# Patient Record
Sex: Female | Born: 1935 | Race: White | Hispanic: No | Smoking: Former smoker
Health system: Southern US, Community
[De-identification: ages and names within clinical notes are randomized; demographics above are authoritative.]

## PROBLEM LIST (undated history)

## (undated) DIAGNOSIS — E079 Disorder of thyroid, unspecified: Secondary | ICD-10-CM

## (undated) DIAGNOSIS — K219 Gastro-esophageal reflux disease without esophagitis: Secondary | ICD-10-CM

## (undated) DIAGNOSIS — E119 Type 2 diabetes mellitus without complications: Secondary | ICD-10-CM

## (undated) DIAGNOSIS — E785 Hyperlipidemia, unspecified: Secondary | ICD-10-CM

## (undated) DIAGNOSIS — C801 Malignant (primary) neoplasm, unspecified: Secondary | ICD-10-CM

## (undated) DIAGNOSIS — I10 Essential (primary) hypertension: Secondary | ICD-10-CM

## (undated) DIAGNOSIS — M1990 Unspecified osteoarthritis, unspecified site: Secondary | ICD-10-CM

## (undated) DIAGNOSIS — N200 Calculus of kidney: Secondary | ICD-10-CM

## (undated) HISTORY — PX: MELANOMA EXCISION: SHX5266

## (undated) HISTORY — PX: TONSILLECTOMY: SUR1361

## (undated) HISTORY — PX: BREAST SURGERY: SHX581

## (undated) HISTORY — DX: Malignant (primary) neoplasm, unspecified: C80.1

## (undated) HISTORY — DX: Hyperlipidemia, unspecified: E78.5

## (undated) HISTORY — DX: Unspecified osteoarthritis, unspecified site: M19.90

## (undated) HISTORY — DX: Gastro-esophageal reflux disease without esophagitis: K21.9

## (undated) HISTORY — DX: Disorder of thyroid, unspecified: E07.9

## (undated) HISTORY — PX: BACK SURGERY: SHX140

---

## 1998-02-10 ENCOUNTER — Ambulatory Visit (HOSPITAL_COMMUNITY): Admission: RE | Admit: 1998-02-10 | Discharge: 1998-02-10 | Payer: Self-pay | Admitting: Internal Medicine

## 1998-08-20 ENCOUNTER — Ambulatory Visit (HOSPITAL_COMMUNITY): Admission: RE | Admit: 1998-08-20 | Discharge: 1998-08-20 | Payer: Self-pay | Admitting: *Deleted

## 1999-08-12 ENCOUNTER — Ambulatory Visit (HOSPITAL_COMMUNITY): Admission: RE | Admit: 1999-08-12 | Discharge: 1999-08-12 | Payer: Self-pay | Admitting: *Deleted

## 2000-08-03 ENCOUNTER — Ambulatory Visit (HOSPITAL_COMMUNITY): Admission: RE | Admit: 2000-08-03 | Discharge: 2000-08-03 | Payer: Self-pay | Admitting: Obstetrics & Gynecology

## 2001-07-26 ENCOUNTER — Ambulatory Visit (HOSPITAL_COMMUNITY): Admission: RE | Admit: 2001-07-26 | Discharge: 2001-07-26 | Payer: Self-pay

## 2002-07-02 ENCOUNTER — Ambulatory Visit (HOSPITAL_COMMUNITY): Admission: RE | Admit: 2002-07-02 | Discharge: 2002-07-02 | Payer: Self-pay | Admitting: *Deleted

## 2003-03-19 ENCOUNTER — Encounter: Admission: RE | Admit: 2003-03-19 | Discharge: 2003-03-19 | Payer: Self-pay | Admitting: Internal Medicine

## 2003-03-19 ENCOUNTER — Encounter: Payer: Self-pay | Admitting: Internal Medicine

## 2005-02-23 ENCOUNTER — Ambulatory Visit: Payer: Self-pay | Admitting: Hematology & Oncology

## 2006-06-28 ENCOUNTER — Encounter: Admission: RE | Admit: 2006-06-28 | Discharge: 2006-06-28 | Payer: Self-pay | Admitting: Radiology

## 2006-07-13 ENCOUNTER — Encounter: Admission: RE | Admit: 2006-07-13 | Discharge: 2006-07-13 | Payer: Self-pay | Admitting: General Surgery

## 2006-07-18 ENCOUNTER — Ambulatory Visit (HOSPITAL_BASED_OUTPATIENT_CLINIC_OR_DEPARTMENT_OTHER): Admission: RE | Admit: 2006-07-18 | Discharge: 2006-07-18 | Payer: Self-pay | Admitting: General Surgery

## 2006-07-18 ENCOUNTER — Encounter (INDEPENDENT_AMBULATORY_CARE_PROVIDER_SITE_OTHER): Payer: Self-pay | Admitting: Specialist

## 2006-08-17 ENCOUNTER — Ambulatory Visit: Payer: Self-pay | Admitting: Hematology & Oncology

## 2006-08-30 LAB — CBC WITH DIFFERENTIAL/PLATELET
BASO%: 1 % (ref 0.0–2.0)
Basophils Absolute: 0.1 10*3/uL (ref 0.0–0.1)
EOS%: 1.5 % (ref 0.0–7.0)
HGB: 15.7 g/dL (ref 11.6–15.9)
MCH: 31 pg (ref 26.0–34.0)
MCHC: 35.3 g/dL (ref 32.0–36.0)
MCV: 87.9 fL (ref 81.0–101.0)
MONO%: 6.5 % (ref 0.0–13.0)
RBC: 5.07 10*6/uL (ref 3.70–5.32)
RDW: 13.4 % (ref 11.3–14.5)

## 2006-08-30 LAB — COMPREHENSIVE METABOLIC PANEL
AST: 24 U/L (ref 0–37)
Albumin: 4.6 g/dL (ref 3.5–5.2)
Alkaline Phosphatase: 74 U/L (ref 39–117)
BUN: 11 mg/dL (ref 6–23)
Creatinine, Ser: 0.78 mg/dL (ref 0.40–1.20)
Potassium: 3.7 mEq/L (ref 3.5–5.3)

## 2006-09-10 ENCOUNTER — Ambulatory Visit: Admission: RE | Admit: 2006-09-10 | Discharge: 2006-11-22 | Payer: Self-pay | Admitting: Radiation Oncology

## 2006-09-11 LAB — HM DEXA SCAN

## 2006-10-18 ENCOUNTER — Ambulatory Visit: Payer: Self-pay | Admitting: Hematology & Oncology

## 2007-01-22 ENCOUNTER — Ambulatory Visit: Payer: Self-pay | Admitting: Hematology & Oncology

## 2007-01-24 LAB — CBC WITH DIFFERENTIAL/PLATELET
Basophils Absolute: 0 10*3/uL (ref 0.0–0.1)
EOS%: 1.1 % (ref 0.0–7.0)
HCT: 41.8 % (ref 34.8–46.6)
HGB: 15.1 g/dL (ref 11.6–15.9)
MCH: 31.3 pg (ref 26.0–34.0)
MCV: 86.8 fL (ref 81.0–101.0)
MONO%: 6.1 % (ref 0.0–13.0)
NEUT%: 66.4 % (ref 39.6–76.8)

## 2007-01-24 LAB — COMPREHENSIVE METABOLIC PANEL
ALT: 32 U/L (ref 0–35)
CO2: 28 mEq/L (ref 19–32)
Calcium: 9.6 mg/dL (ref 8.4–10.5)
Chloride: 107 mEq/L (ref 96–112)
Creatinine, Ser: 0.76 mg/dL (ref 0.40–1.20)
Glucose, Bld: 113 mg/dL — ABNORMAL HIGH (ref 70–99)
Sodium: 143 mEq/L (ref 135–145)
Total Protein: 7.3 g/dL (ref 6.0–8.3)

## 2007-04-24 ENCOUNTER — Ambulatory Visit: Payer: Self-pay | Admitting: Hematology & Oncology

## 2007-04-29 LAB — CBC WITH DIFFERENTIAL/PLATELET
BASO%: 1.7 % (ref 0.0–2.0)
MCHC: 36.5 g/dL — ABNORMAL HIGH (ref 32.0–36.0)
MONO#: 0.3 10*3/uL (ref 0.1–0.9)
NEUT#: 3.1 10*3/uL (ref 1.5–6.5)
RBC: 4.84 10*6/uL (ref 3.70–5.32)
WBC: 5.2 10*3/uL (ref 3.9–10.0)
lymph#: 1.6 10*3/uL (ref 0.9–3.3)

## 2007-04-29 LAB — COMPREHENSIVE METABOLIC PANEL
ALT: 34 U/L (ref 0–35)
Albumin: 4.2 g/dL (ref 3.5–5.2)
CO2: 27 mEq/L (ref 19–32)
Calcium: 9.4 mg/dL (ref 8.4–10.5)
Chloride: 107 mEq/L (ref 96–112)
Potassium: 3.7 mEq/L (ref 3.5–5.3)
Sodium: 144 mEq/L (ref 135–145)
Total Protein: 7.1 g/dL (ref 6.0–8.3)

## 2007-08-05 ENCOUNTER — Ambulatory Visit: Payer: Self-pay | Admitting: Hematology & Oncology

## 2007-08-07 LAB — COMPREHENSIVE METABOLIC PANEL
ALT: 34 U/L (ref 0–35)
Alkaline Phosphatase: 76 U/L (ref 39–117)
CO2: 22 mEq/L (ref 19–32)
Sodium: 140 mEq/L (ref 135–145)
Total Bilirubin: 0.6 mg/dL (ref 0.3–1.2)
Total Protein: 7.1 g/dL (ref 6.0–8.3)

## 2007-08-07 LAB — CBC WITH DIFFERENTIAL/PLATELET
BASO%: 0.8 % (ref 0.0–2.0)
LYMPH%: 33.5 % (ref 14.0–48.0)
MCHC: 36.4 g/dL — ABNORMAL HIGH (ref 32.0–36.0)
MONO#: 0.3 10*3/uL (ref 0.1–0.9)
Platelets: 245 10*3/uL (ref 145–400)
RBC: 4.78 10*6/uL (ref 3.70–5.32)
WBC: 5.4 10*3/uL (ref 3.9–10.0)
lymph#: 1.8 10*3/uL (ref 0.9–3.3)

## 2007-12-09 ENCOUNTER — Ambulatory Visit: Payer: Self-pay | Admitting: Hematology & Oncology

## 2007-12-11 LAB — COMPREHENSIVE METABOLIC PANEL
ALT: 39 U/L — ABNORMAL HIGH (ref 0–35)
AST: 28 U/L (ref 0–37)
Albumin: 4.3 g/dL (ref 3.5–5.2)
Alkaline Phosphatase: 86 U/L (ref 39–117)
Chloride: 106 mEq/L (ref 96–112)
Potassium: 3.8 mEq/L (ref 3.5–5.3)
Sodium: 142 mEq/L (ref 135–145)
Total Protein: 7.1 g/dL (ref 6.0–8.3)

## 2007-12-11 LAB — CBC WITH DIFFERENTIAL/PLATELET
BASO%: 1.3 % (ref 0.0–2.0)
EOS%: 3.1 % (ref 0.0–7.0)
MCH: 29.1 pg (ref 26.0–34.0)
MCV: 86.2 fL (ref 81.0–101.0)
MONO%: 5.9 % (ref 0.0–13.0)
RBC: 4.93 10*6/uL (ref 3.70–5.32)
RDW: 13 % (ref 11.3–14.5)
lymph#: 1.7 10*3/uL (ref 0.9–3.3)

## 2008-04-07 ENCOUNTER — Ambulatory Visit: Payer: Self-pay | Admitting: Hematology & Oncology

## 2008-04-09 LAB — COMPREHENSIVE METABOLIC PANEL
AST: 27 U/L (ref 0–37)
Albumin: 4.3 g/dL (ref 3.5–5.2)
BUN: 10 mg/dL (ref 6–23)
CO2: 23 mEq/L (ref 19–32)
Calcium: 9 mg/dL (ref 8.4–10.5)
Chloride: 107 mEq/L (ref 96–112)
Creatinine, Ser: 0.72 mg/dL (ref 0.40–1.20)
Glucose, Bld: 127 mg/dL — ABNORMAL HIGH (ref 70–99)
Potassium: 3.6 mEq/L (ref 3.5–5.3)

## 2008-04-09 LAB — CBC WITH DIFFERENTIAL/PLATELET
Basophils Absolute: 0.1 10*3/uL (ref 0.0–0.1)
Eosinophils Absolute: 0.1 10*3/uL (ref 0.0–0.5)
HCT: 41.1 % (ref 34.8–46.6)
HGB: 14.7 g/dL (ref 11.6–15.9)
MCH: 30.2 pg (ref 26.0–34.0)
MONO#: 0.3 10*3/uL (ref 0.1–0.9)
NEUT#: 3 10*3/uL (ref 1.5–6.5)
NEUT%: 57.5 % (ref 39.6–76.8)
RDW: 12.1 % (ref 11.3–14.5)
lymph#: 1.7 10*3/uL (ref 0.9–3.3)

## 2008-06-30 LAB — HM COLONOSCOPY

## 2008-09-30 ENCOUNTER — Ambulatory Visit: Payer: Self-pay | Admitting: Hematology & Oncology

## 2008-10-01 LAB — CBC WITH DIFFERENTIAL (CANCER CENTER ONLY)
Eosinophils Absolute: 0.2 10*3/uL (ref 0.0–0.5)
HCT: 41.8 % (ref 34.8–46.6)
LYMPH%: 38.4 % (ref 14.0–48.0)
MCH: 31 pg (ref 26.0–34.0)
MCV: 88 fL (ref 81–101)
MONO#: 0.4 10*3/uL (ref 0.1–0.9)
MONO%: 6 % (ref 0.0–13.0)
NEUT%: 52 % (ref 39.6–80.0)
RBC: 4.75 10*6/uL (ref 3.70–5.32)
RDW: 12 % (ref 10.5–14.6)
WBC: 6.7 10*3/uL (ref 3.9–10.0)

## 2008-10-01 LAB — COMPREHENSIVE METABOLIC PANEL
AST: 18 U/L (ref 0–37)
Albumin: 4.2 g/dL (ref 3.5–5.2)
BUN: 13 mg/dL (ref 6–23)
Calcium: 9.5 mg/dL (ref 8.4–10.5)
Chloride: 103 mEq/L (ref 96–112)
Creatinine, Ser: 0.88 mg/dL (ref 0.40–1.20)
Glucose, Bld: 85 mg/dL (ref 70–99)
Potassium: 3.7 mEq/L (ref 3.5–5.3)

## 2009-03-31 ENCOUNTER — Ambulatory Visit: Payer: Self-pay | Admitting: Hematology & Oncology

## 2009-04-01 LAB — CBC WITH DIFFERENTIAL (CANCER CENTER ONLY)
BASO#: 0.1 10*3/uL (ref 0.0–0.2)
Eosinophils Absolute: 0.2 10*3/uL (ref 0.0–0.5)
HGB: 15.6 g/dL (ref 11.6–15.9)
LYMPH%: 35.8 % (ref 14.0–48.0)
MCH: 30.5 pg (ref 26.0–34.0)
MCV: 90 fL (ref 81–101)
MONO#: 0.4 10*3/uL (ref 0.1–0.9)
Platelets: 262 10*3/uL (ref 145–400)
RBC: 5.12 10*6/uL (ref 3.70–5.32)
WBC: 7.6 10*3/uL (ref 3.9–10.0)

## 2009-04-02 LAB — COMPREHENSIVE METABOLIC PANEL
ALT: 25 U/L (ref 0–35)
AST: 20 U/L (ref 0–37)
Albumin: 4.3 g/dL (ref 3.5–5.2)
Alkaline Phosphatase: 73 U/L (ref 39–117)
Calcium: 9.7 mg/dL (ref 8.4–10.5)
Chloride: 105 mEq/L (ref 96–112)
Potassium: 3.5 mEq/L (ref 3.5–5.3)

## 2009-10-13 ENCOUNTER — Ambulatory Visit: Payer: Self-pay | Admitting: Hematology & Oncology

## 2009-10-28 LAB — CBC WITH DIFFERENTIAL (CANCER CENTER ONLY)
BASO%: 0.6 % (ref 0.0–2.0)
EOS%: 1.7 % (ref 0.0–7.0)
LYMPH%: 33.8 % (ref 14.0–48.0)
MCH: 30.1 pg (ref 26.0–34.0)
MCHC: 34.5 g/dL (ref 32.0–36.0)
MCV: 87 fL (ref 81–101)
MONO%: 5.3 % (ref 0.0–13.0)
Platelets: 239 10*3/uL (ref 145–400)
RDW: 11.5 % (ref 10.5–14.6)
WBC: 6.8 10*3/uL (ref 3.9–10.0)

## 2009-10-28 LAB — VITAMIN D 25 HYDROXY (VIT D DEFICIENCY, FRACTURES): Vit D, 25-Hydroxy: 39 ng/mL (ref 30–89)

## 2009-10-28 LAB — COMPREHENSIVE METABOLIC PANEL
ALT: 30 U/L (ref 0–35)
AST: 23 U/L (ref 0–37)
Alkaline Phosphatase: 68 U/L (ref 39–117)
BUN: 10 mg/dL (ref 6–23)
Creatinine, Ser: 0.83 mg/dL (ref 0.40–1.20)
Potassium: 4.2 mEq/L (ref 3.5–5.3)

## 2010-04-13 ENCOUNTER — Ambulatory Visit: Payer: Self-pay | Admitting: Hematology & Oncology

## 2010-04-14 LAB — COMPREHENSIVE METABOLIC PANEL
BUN: 8 mg/dL (ref 6–23)
CO2: 22 mEq/L (ref 19–32)
Calcium: 9.2 mg/dL (ref 8.4–10.5)
Chloride: 104 mEq/L (ref 96–112)
Creatinine, Ser: 0.82 mg/dL (ref 0.40–1.20)
Glucose, Bld: 175 mg/dL — ABNORMAL HIGH (ref 70–99)
Total Bilirubin: 0.6 mg/dL (ref 0.3–1.2)

## 2010-04-14 LAB — CBC WITH DIFFERENTIAL (CANCER CENTER ONLY)
BASO%: 0.6 % (ref 0.0–2.0)
Eosinophils Absolute: 0.3 10*3/uL (ref 0.0–0.5)
LYMPH#: 2.3 10*3/uL (ref 0.9–3.3)
MCV: 89 fL (ref 81–101)
MONO#: 0.2 10*3/uL (ref 0.1–0.9)
NEUT#: 2.6 10*3/uL (ref 1.5–6.5)
Platelets: 235 10*3/uL (ref 145–400)
RBC: 5.07 10*6/uL (ref 3.70–5.32)
RDW: 11.4 % (ref 10.5–14.6)
WBC: 5.4 10*3/uL (ref 3.9–10.0)

## 2010-04-14 LAB — VITAMIN D 25 HYDROXY (VIT D DEFICIENCY, FRACTURES): Vit D, 25-Hydroxy: 41 ng/mL (ref 30–89)

## 2010-05-24 ENCOUNTER — Encounter: Admission: RE | Admit: 2010-05-24 | Discharge: 2010-05-24 | Payer: Self-pay | Admitting: Internal Medicine

## 2010-10-11 ENCOUNTER — Ambulatory Visit: Payer: Self-pay | Admitting: Hematology & Oncology

## 2010-10-13 LAB — CBC WITH DIFFERENTIAL (CANCER CENTER ONLY)
BASO#: 0 10*3/uL (ref 0.0–0.2)
EOS%: 1.3 % (ref 0.0–7.0)
HCT: 45.1 % (ref 34.8–46.6)
HGB: 15.4 g/dL (ref 11.6–15.9)
LYMPH#: 2.8 10*3/uL (ref 0.9–3.3)
MCHC: 34.2 g/dL (ref 32.0–36.0)
MONO#: 0.4 10*3/uL (ref 0.1–0.9)
NEUT#: 2 10*3/uL (ref 1.5–6.5)
NEUT%: 38 % — ABNORMAL LOW (ref 39.6–80.0)
RBC: 5.08 10*6/uL (ref 3.70–5.32)
WBC: 5.4 10*3/uL (ref 3.9–10.0)

## 2010-10-14 LAB — COMPREHENSIVE METABOLIC PANEL
ALT: 25 U/L (ref 0–35)
AST: 25 U/L (ref 0–37)
Albumin: 4.4 g/dL (ref 3.5–5.2)
BUN: 11 mg/dL (ref 6–23)
CO2: 27 mEq/L (ref 19–32)
Calcium: 9.9 mg/dL (ref 8.4–10.5)
Chloride: 102 mEq/L (ref 96–112)
Creatinine, Ser: 0.87 mg/dL (ref 0.40–1.20)
Potassium: 4.4 mEq/L (ref 3.5–5.3)

## 2010-10-14 LAB — VITAMIN D 25 HYDROXY (VIT D DEFICIENCY, FRACTURES): Vit D, 25-Hydroxy: 46 ng/mL (ref 30–89)

## 2010-11-20 ENCOUNTER — Encounter: Payer: Self-pay | Admitting: Radiology

## 2011-03-17 NOTE — Op Note (Signed)
NAMEMARLON, [name]              ACCOUNT NO.:  [id number]   MEDICAL RECORD NO.:  [id number]          PATIENT TYPE:  AMB   LOCATION:  DSC                          FACILITY:  [facility]   PHYSICIAN:  Sharlet Salina T. Hoxworth, M.D.DATE OF BIRTH:  June 25, 1935   DATE OF PROCEDURE:  07/18/2006  DATE OF DISCHARGE:                                 OPERATIVE REPORT   PREOPERATIVE DIAGNOSES:  1. Cancer left breast.  2. Intraductal papilloma right breast.   POSTOPERATIVE DIAGNOSES:  1. Cancer left breast.  2. Intraductal papilloma right breast.   SURGICAL PROCEDURES:  1. Blue dye injection left breast.  2. Left axillary sentinel lymph node biopsy.  3. Needle-localized left breast lumpectomy.  4. Needle-localized right breast lumpectomy.   SURGEON:  Lorne Skeens. Hoxworth, M.D.   ANESTHESIA:  Laryngeal mask general.   BRIEF HISTORY:  [name] is a 76 year old female who, on a recent  screening mammogram, was found to have a new small mass at the 8 o'clock  position of the left breast.  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  MRI showed, in addition, showed a small mass on the right side  not seen on mammogram and core biopsy has shown an intraductal papilloma.  After extensive discussion regarding treatment options and risks detailed  elsewhere, we have elected to proceed with left breast lumpectomy with  axillary sentinel lymph node biopsy and possible axillary dissection and  right breast lumpectomy.  Following needle localization of both these areas  and following injection of 1 mCi of technetium sulfur colloid in nuclear  medicine prior to the procedure, she is brought to the operating room for  these procedures.   DESCRIPTION OF PROCEDURE:  Patient brought to the operating room and placed  in the supine position on the operating table and laryngeal mask general  anesthesia was induced.  Both  breasts were widely sterilely prepped and  draped.  The right breast was then isolated and attention was turned to the  left axilla.  A hot area in the left axilla was easily localized with the  NeoProbe and a small incision made and dissection carried down through  subcutaneous tissue and clavipectoral fascia using cautery.  Using the  NeoProbe for guide, dissection was bluntly carried down onto a bright blue  lymph node which had very hot counts.  This node was elevated and completely  excised with the cautery.  Ex vivo of the node had counts of approximately  1700 and background in the axilla was less than 20.  This was sent as hot  blue axillary sentinel lymph node.  While awaiting this report, attention  was turned to the lumpectomy.  A curvilinear incision was made in the medial  inferior left breast just above the wire insertion site.  Dissection carried  down to the subcutaneous tissue.  The wire was brought into the wound.  A  generous core of tissue was then excised with the cautery around the shaft  of the tip of the wire.  There was a  slight firmness palpable within the  specimen but no large mass.  The sentinel lymph node touch prep returned as  negative for malignant cells.  The breast specimen was oriented and sent for  specimen x-ray which showed the lesion within the specimen.  Each wound was  then infiltrated with Marcaine and closed with interrupted subcutaneous 4-0  Monocryl running subcuticular 4-0 Monocryl and Steri-Strips after obtaining  complete hemostasis with cautery.  The right breast was then exposed.  Gloves and instruments changed.  A curvilinear incision was made near the  wire insertion site in the inferior right breast essentially circumareolar  and dissection carried down to the subcutaneous tissue.  The wire was then  brought into the wound and a specimen of tissue excised around the shaft of  the wire with cautery.  Specimen mammography confirmed  inclusion of the clip  and the lesion within this specimen.  This area was infiltrated with  Marcaine and complete hemostasis assured.  It was closed identically to the  other incisions.  All sponge, needle and instrument counts were correct.  Dry sterile dressings were applied.  The patient was taken to recovery in  good condition.      Lorne Skeens. Hoxworth, M.D.  Electronically Signed     BTH/MEDQ  D:  07/18/2006  T:  07/19/2006  Job:  [id number]

## 2011-10-11 ENCOUNTER — Other Ambulatory Visit: Payer: Self-pay | Admitting: *Deleted

## 2011-10-11 DIAGNOSIS — C50919 Malignant neoplasm of unspecified site of unspecified female breast: Secondary | ICD-10-CM

## 2011-10-11 MED ORDER — LETROZOLE 2.5 MG PO TABS: 2.5000 mg | ORAL_TABLET | Freq: Every day | ORAL | Status: DC

## 2011-10-16 ENCOUNTER — Other Ambulatory Visit: Payer: Self-pay | Admitting: *Deleted

## 2011-10-16 ENCOUNTER — Telehealth: Payer: Self-pay | Admitting: *Deleted

## 2011-10-16 DIAGNOSIS — C50919 Malignant neoplasm of unspecified site of unspecified female breast: Secondary | ICD-10-CM

## 2011-10-16 DIAGNOSIS — C50319 Malignant neoplasm of lower-inner quadrant of unspecified female breast: Secondary | ICD-10-CM

## 2011-10-16 MED ORDER — LETROZOLE 2.5 MG PO TABS: 2.5000 mg | ORAL_TABLET | Freq: Every day | ORAL | Status: DC

## 2011-10-16 NOTE — Telephone Encounter (Signed)
Pt called stating that she needs a Femara refill (has one pill left). Rx was not received at [facility]. Also needs an appt with Dr Myna Hidalgo. She stated that she normally sees him in Dec. Will send request to schedulers to schedule appt and re-send rx request to [facility] for 30 day fill as his last note stated she should finish in Dec 2012.

## 2011-10-16 NOTE — Telephone Encounter (Signed)
Left pt message with 12-19 appointment

## 2011-10-18 ENCOUNTER — Ambulatory Visit (HOSPITAL_BASED_OUTPATIENT_CLINIC_OR_DEPARTMENT_OTHER): Payer: Medicare Other | Admitting: Hematology & Oncology

## 2011-10-18 ENCOUNTER — Other Ambulatory Visit (HOSPITAL_BASED_OUTPATIENT_CLINIC_OR_DEPARTMENT_OTHER): Payer: Medicare Other | Admitting: Lab

## 2011-10-18 DIAGNOSIS — C50319 Malignant neoplasm of lower-inner quadrant of unspecified female breast: Secondary | ICD-10-CM

## 2011-10-18 DIAGNOSIS — C50919 Malignant neoplasm of unspecified site of unspecified female breast: Secondary | ICD-10-CM

## 2011-10-18 LAB — CBC WITH DIFFERENTIAL (CANCER CENTER ONLY)
BASO#: 0 10*3/uL (ref 0.0–0.2)
EOS%: 1 % (ref 0.0–7.0)
HGB: 15.4 g/dL (ref 11.6–15.9)
LYMPH%: 36.7 % (ref 14.0–48.0)
MCH: 30.9 pg (ref 26.0–34.0)
MCHC: 35.7 g/dL (ref 32.0–36.0)
MCV: 86 fL (ref 81–101)
MONO%: 5.8 % (ref 0.0–13.0)
NEUT#: 4 10*3/uL (ref 1.5–6.5)

## 2011-10-18 LAB — COMPREHENSIVE METABOLIC PANEL
AST: 24 U/L (ref 0–37)
Albumin: 4.3 g/dL (ref 3.5–5.2)
Alkaline Phosphatase: 68 U/L (ref 39–117)
BUN: 10 mg/dL (ref 6–23)
Creatinine, Ser: 0.73 mg/dL (ref 0.50–1.10)
Potassium: 4 mEq/L (ref 3.5–5.3)
Total Bilirubin: 0.5 mg/dL (ref 0.3–1.2)

## 2011-10-18 MED ORDER — LETROZOLE 2.5 MG PO TABS: 2.5000 mg | ORAL_TABLET | Freq: Every day | ORAL | Status: DC

## 2011-10-18 NOTE — Progress Notes (Signed)
Addended by: Arlan Organ R on: 10/18/2011 02:14 PM   Modules accepted: Orders

## 2011-10-18 NOTE — Progress Notes (Signed)
This office note has been dictated.

## 2011-10-18 NOTE — Progress Notes (Signed)
CC:   [name], M.D. Lorne Skeens. Hoxworth, M.D. 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  CURRENT THERAPY:  Femara 2.5 mg p.o. daily.  HISTORY:  [name] comes in for followup.  She now is married.  She got married after Thanksgiving.  I am happy for her.  She feels well.  She has had no complaints.  We last saw her a year ago. She has had a pretty good year.  She has not noted any issues with increasing fatigue or weakness.  There is no cough or shortness of breath.  She has had no nausea or vomiting. There has been no change in bowel or bladder habits.  She has not noted any rashes.  There are no headaches.  There is no change in vision.  PHYSICAL EXAM:  General:  This is a well-developed, well-nourished, white female in no obvious distress.  Vital Signs:  97.7, pulse 75, respiratory rate 18, blood pressure 147/74.  Weight is 169.  Head and Neck Exam:  Normocephalic, atraumatic skull.  There are no ocular or oral lesions.  There are no palpable cervical or supraclavicular lymph nodes.  Lungs:  Clear bilaterally.  Cardiac Exam:  Regular rate and rhythm with normal S1, S2.  There are no murmurs, rubs, or bruits. Breast exam:  Right breast with no masses, edema, or erythema.  There is no right axillary adenopathy.  Left breast shows well-healed lumpectomy at the at 8 o'clock position.  She has no masses in the left breast. There may be some slight contraction from past radiation.  There is no left axillary adenopathy.  Abdominal Exam:  Soft with good bowel sounds. There is no palpable abdominal mass.  There is no palpable hepatosplenomegaly.  Back Exam:  No tenderness over the spine, ribs, or hips.  Extremities:  No clubbing, cyanosis, or edema.  Neurological Exam:  No focal neurological deficits.  LABORATORY STUDIES:  White cell count 7.1, hemoglobin 15.4, hematocrit 43.1, platelet count 243.  Sodium 141, potassium  4.4, BUN 11, creatinine 0.7.  LFTs are normal.  Vitamin D is 46.  IMPRESSION:  [name] is a 76 year old white female with history of stage I ductal carcinoma of the left breast.  She underwent lumpectomy in December 2007.  She did receive postop radiation therapy.  We will finish up 5 years of Femara in June 2013.  I will hopefully be able to get her off the Femara at that point in time.  I am glad that she did find happiness again.  Her husband passed away probably about 11 years ago from esophageal cancer.  I will see [name] back in June to follow up with her.    ______________________________ Josph Macho, M.D. PRE/MEDQ  D:  10/18/2011  T:  10/18/2011  Job:  [id number]

## 2012-02-07 ENCOUNTER — Other Ambulatory Visit: Payer: Self-pay | Admitting: *Deleted

## 2012-02-07 MED ORDER — LETROZOLE 2.5 MG PO TABS: 2.5000 mg | ORAL_TABLET | Freq: Every day | ORAL | Status: DC

## 2012-02-07 NOTE — Telephone Encounter (Signed)
Pt called stating she was in need of a Femara rx. While she was down in [facility] she had it transferred from the pharmacy here but when she returned she was told it was only allowed to be transferred once. She has a scheduled f/u in June. This is a chronic med for her. Will send via eprescribe to her pharmacy.

## 2012-03-19 ENCOUNTER — Encounter (HOSPITAL_COMMUNITY): Payer: Self-pay | Admitting: Dietician

## 2012-03-19 NOTE — Progress Notes (Signed)
[facility] Diabetes Class Completion  Date:Mar 19, 2012  Time: 10:00 AM  Pt attended [facility]'s Diabetes Class on Mar 19, 2012.   Patient was educated on the following topics: carbohydrate metabolism in relation to diabetes, sources of carbohydrate, carbohydrate counting, meal planning strategies, food label reading, and portion control.   Melody Haver, RD, LDN Date:Mar 19, 2012 Time: 10:00 AM

## 2012-04-11 ENCOUNTER — Ambulatory Visit (HOSPITAL_BASED_OUTPATIENT_CLINIC_OR_DEPARTMENT_OTHER): Payer: Medicare Other | Admitting: Hematology & Oncology

## 2012-04-11 ENCOUNTER — Other Ambulatory Visit (HOSPITAL_BASED_OUTPATIENT_CLINIC_OR_DEPARTMENT_OTHER): Payer: Medicare Other | Admitting: Lab

## 2012-04-11 VITALS — BP 117/69 | HR 68 | Temp 97.6°F | Ht 62.0 in | Wt 169.0 lb

## 2012-04-11 DIAGNOSIS — C50319 Malignant neoplasm of lower-inner quadrant of unspecified female breast: Secondary | ICD-10-CM

## 2012-04-11 DIAGNOSIS — C50919 Malignant neoplasm of unspecified site of unspecified female breast: Secondary | ICD-10-CM

## 2012-04-11 DIAGNOSIS — M818 Other osteoporosis without current pathological fracture: Secondary | ICD-10-CM

## 2012-04-11 LAB — CBC WITH DIFFERENTIAL (CANCER CENTER ONLY)
Eosinophils Absolute: 0.1 10*3/uL (ref 0.0–0.5)
MONO#: 0.6 10*3/uL (ref 0.1–0.9)
MONO%: 7.3 % (ref 0.0–13.0)
NEUT#: 3.5 10*3/uL (ref 1.5–6.5)
Platelets: 233 10*3/uL (ref 145–400)
RBC: 5.03 10*6/uL (ref 3.70–5.32)
WBC: 7.8 10*3/uL (ref 3.9–10.0)

## 2012-04-11 NOTE — Progress Notes (Signed)
This office note has been dictated.

## 2012-04-12 LAB — COMPREHENSIVE METABOLIC PANEL
Albumin: 4.4 g/dL (ref 3.5–5.2)
Alkaline Phosphatase: 71 U/L (ref 39–117)
CO2: 29 mEq/L (ref 19–32)
Glucose, Bld: 89 mg/dL (ref 70–99)
Potassium: 4.2 mEq/L (ref 3.5–5.3)
Sodium: 141 mEq/L (ref 135–145)
Total Protein: 7.3 g/dL (ref 6.0–8.3)

## 2012-04-12 NOTE — Progress Notes (Signed)
CC:   Antony Madura, M.D. Lorne Skeens. Johna Sheriff, M.D. Jeralyn Ruths, MD  DIAGNOSIS:  Stage I infiltrating ductal carcinoma of the left breast (T1c N0 M0).  CURRENT THERAPY:  The patient to stop Femara.  INTERIM HISTORY:  Ms. Puccinelli comes in for followup.  She has been on Femara now for 5 years.  As such, we are going to get her off Femara.  I think that she will be okay being on Femara for 5 years.  She is doing well.  She is active around the house.  She will be going down to [place] for a couple of months with her husband to visit family.  She has had no problems with or myalgias or arthralgias.  She has had no fevers, sweats, or chills.  There has been no change in bowel or bladder habits.  She has had no cough or shortness of breath.  PHYSICAL EXAMINATION:  General:  This is a well-developed, well- nourished white female in no obvious distress.  Vital Signs: Temperature 97.6, pulse 68, respiratory rate 18, blood pressure 117/69, weight is 169.  Head and Neck Exam:  Shows a normocephalic, atraumatic skull.  There are no ocular or oral lesions.  There are no palpable cervical or supraclavicular lymph nodes.  Lungs:  Clear bilaterally. Cardiac Exam:  Regular rate and rhythm with a normal S1 and S2.  There are no murmurs, rubs, or bruits.  Abdominal Exam:  Soft with good bowel sounds.  There is no palpable abdominal mass.  There is no palpable hepatosplenomegaly.  Breast Exam:  Shows right breast with no masses, edema, or erythema.  There is no right axillary adenopathy.  Left breast shows a well-healed lumpectomy at about the 8 o'clock position.  There is no tenderness to the left breast.  There may be some slight firmness at the lumpectomy site.  No distinct mass is noted in the left breast. There is no left axillary adenopathy.  Back Exam:  No tenderness over the spine, ribs, or hips.  She has a well-healed lumbar laminectomy scar.  Extremities:  Show no clubbing,  cyanosis, or edema.  She has good range of motion of her joints.  LABORATORY STUDIES:  White cell count 7.8, hemoglobin 15.6, hematocrit 43.9, platelet count 233.  IMPRESSION:  Ms. Badilla is a 77 year old female with stage I infiltrating ductal carcinoma of the left breast.  She underwent lumpectomy and radiation.  She has been on 5 years of Femara.  We will go ahead and stop the Femara now.  We will go ahead and plan to get her back to see [name] in 6 months now.  I do not see a need for any scans to be done.  She will be due, I think for her mammogram in the fall.    ______________________________ Josph Macho, M.D. PRE/MEDQ  D:  04/11/2012  T:  04/12/2012  Job:  [id number]

## 2012-04-16 ENCOUNTER — Telehealth: Payer: Self-pay | Admitting: Hematology & Oncology

## 2012-04-16 NOTE — Telephone Encounter (Addendum)
Message copied by Cathi Roan on Tue Apr 16, 2012  2:49 PM ------      Message from: Josph Macho      Created: Sun Apr 14, 2012  9:11 PM       Call - labs are ok.  Angelique Blonder and spoke to patient today, regarding her lab results per above MD message. ST

## 2012-10-03 ENCOUNTER — Ambulatory Visit: Payer: Medicare Other | Admitting: Hematology & Oncology

## 2012-10-03 ENCOUNTER — Other Ambulatory Visit: Payer: Medicare Other | Admitting: Lab

## 2012-10-10 ENCOUNTER — Other Ambulatory Visit (HOSPITAL_BASED_OUTPATIENT_CLINIC_OR_DEPARTMENT_OTHER): Payer: Medicare Other | Admitting: Lab

## 2012-10-10 ENCOUNTER — Ambulatory Visit (HOSPITAL_BASED_OUTPATIENT_CLINIC_OR_DEPARTMENT_OTHER): Payer: Medicare Other | Admitting: Hematology & Oncology

## 2012-10-10 ENCOUNTER — Encounter: Payer: Self-pay | Admitting: Hematology & Oncology

## 2012-10-10 VITALS — BP 108/50 | HR 51 | Temp 97.5°F | Resp 16 | Ht 62.0 in | Wt 160.0 lb

## 2012-10-10 DIAGNOSIS — M810 Age-related osteoporosis without current pathological fracture: Secondary | ICD-10-CM

## 2012-10-10 DIAGNOSIS — C50919 Malignant neoplasm of unspecified site of unspecified female breast: Secondary | ICD-10-CM

## 2012-10-10 DIAGNOSIS — E559 Vitamin D deficiency, unspecified: Secondary | ICD-10-CM

## 2012-10-10 DIAGNOSIS — M818 Other osteoporosis without current pathological fracture: Secondary | ICD-10-CM

## 2012-10-10 DIAGNOSIS — Z853 Personal history of malignant neoplasm of breast: Secondary | ICD-10-CM

## 2012-10-10 LAB — CBC WITH DIFFERENTIAL (CANCER CENTER ONLY)
BASO#: 0 10*3/uL (ref 0.0–0.2)
EOS%: 1.4 % (ref 0.0–7.0)
Eosinophils Absolute: 0.1 10*3/uL (ref 0.0–0.5)
HGB: 15.6 g/dL (ref 11.6–15.9)
LYMPH#: 3.2 10*3/uL (ref 0.9–3.3)
NEUT#: 3.3 10*3/uL (ref 1.5–6.5)
RBC: 5.12 10*6/uL (ref 3.70–5.32)

## 2012-10-10 NOTE — Progress Notes (Signed)
This office note has been dictated.

## 2012-10-11 LAB — COMPREHENSIVE METABOLIC PANEL
AST: 18 U/L (ref 0–37)
Albumin: 4.3 g/dL (ref 3.5–5.2)
BUN: 16 mg/dL (ref 6–23)
Calcium: 9.9 mg/dL (ref 8.4–10.5)
Chloride: 104 mEq/L (ref 96–112)
Glucose, Bld: 107 mg/dL — ABNORMAL HIGH (ref 70–99)
Potassium: 3.9 mEq/L (ref 3.5–5.3)

## 2012-10-11 NOTE — Progress Notes (Signed)
CC:   Antony Madura, M.D. Jeralyn Ruths, MD Lorne Skeens. Hoxworth, M.D.  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  CURRENT THERAPY:  Observation.  INTERIM HISTORY:  Ms. Wirick comes in for her followup.  We see her every 6 months.  She is doing well.  Since we last saw her, she was I think down in [place].  She and her husband have family down there.  She has had a recent mammogram.  Apparently there was a cyst noted in the left breast.  This apparently was unremarkable.  She has had no change in bowel or bladder habits.  She has had no fevers, sweats or chills.  She has had no cough or shortness breath. There have been no problems with leg swelling.  She has had no rashes.  PHYSICAL EXAMINATION:  General:  This is a well-developed, well- nourished white female in no obvious distress.  Vital signs:  97.5, pulse 51, respiratory rate 16, blood pressure 108/50.  Weight is 160. Head and neck:  Shows a normocephalic, atraumatic skull.  There are no ocular or oral lesions.  There are no palpable cervical or supraclavicular lymph nodes.  Lungs:  Clear bilaterally.  Cardiac: Regular rate and rhythm with a normal S1 and S2.  There are no murmurs, rubs or bruits.  Breasts:  Show right breast with no masses, edema or erythema.  There is no right axillary adenopathy.  Left breast shows well-healed lumpectomy at the 7 o'clock position.  There is some slight contraction of the left breast.  No distinct masses noted in the left breast.  There is no left axillary adenopathy.  Abdomen:  Soft with good bowel sounds.  There is no fluid wave.  There is no palpable hepatosplenomegaly.  Back:  Shows no kyphosis.  There are no osteoporotic changes.  No tenderness is noted over the spine, ribs or hips.  Extremities:  Show no clubbing, cyanosis or edema.  No lymphedema is noted in the left arm.  LABORATORY STUDIES:  White cell count is 7.1, hemoglobin  15.6, hematocrit 45.6, platelet count 228.  IMPRESSION:  Ms. Schiano is a 77 year old white female with history of stage I infiltrating ductal carcinoma of the left breast.  She underwent lumpectomy.  She had radiation therapy.  She completed 5 years of Femara back in June of this past year.  I do not see any evidence of recurrence.  We will continue to follow her along every 6 months for now.    ______________________________ Josph Macho, M.D. PRE/MEDQ  D:  10/10/2012  T:  10/11/2012  Job:  [id number]

## 2012-10-14 ENCOUNTER — Telehealth: Payer: Self-pay | Admitting: *Deleted

## 2012-10-14 NOTE — Telephone Encounter (Signed)
Message copied by Anselm Jungling on Mon Oct 14, 2012  2:02 PM ------      Message from: Josph Macho      Created: Sat Oct 12, 2012 12:18 PM       Call - labs are great!!  Cindee Lame

## 2012-10-14 NOTE — Telephone Encounter (Signed)
Called patient to let her know that her labs were all great per dr. Myna Hidalgo

## 2013-03-05 ENCOUNTER — Ambulatory Visit
Admission: RE | Admit: 2013-03-05 | Discharge: 2013-03-05 | Disposition: A | Discharge status: Home / Self Care | Payer: Medicare Other | Source: Ambulatory Visit | Attending: Internal Medicine | Admitting: Internal Medicine

## 2013-03-05 ENCOUNTER — Other Ambulatory Visit: Payer: Self-pay | Admitting: Internal Medicine

## 2013-03-05 DIAGNOSIS — M549 Dorsalgia, unspecified: Secondary | ICD-10-CM

## 2013-03-21 ENCOUNTER — Telehealth: Payer: Self-pay | Admitting: Hematology & Oncology

## 2013-03-21 NOTE — Telephone Encounter (Signed)
Patient called and cx 04/10/13 apt and resch for 05/01/13

## 2013-04-02 ENCOUNTER — Ambulatory Visit (HOSPITAL_BASED_OUTPATIENT_CLINIC_OR_DEPARTMENT_OTHER): Payer: Medicare Other | Admitting: Hematology & Oncology

## 2013-04-02 ENCOUNTER — Other Ambulatory Visit (HOSPITAL_BASED_OUTPATIENT_CLINIC_OR_DEPARTMENT_OTHER): Payer: Medicare Other | Admitting: Lab

## 2013-04-02 VITALS — BP 104/55 | HR 69 | Temp 98.2°F | Resp 16 | Ht 62.0 in | Wt 162.0 lb

## 2013-04-02 DIAGNOSIS — M810 Age-related osteoporosis without current pathological fracture: Secondary | ICD-10-CM

## 2013-04-02 DIAGNOSIS — C50911 Malignant neoplasm of unspecified site of right female breast: Secondary | ICD-10-CM

## 2013-04-02 DIAGNOSIS — C50919 Malignant neoplasm of unspecified site of unspecified female breast: Secondary | ICD-10-CM

## 2013-04-02 DIAGNOSIS — Z853 Personal history of malignant neoplasm of breast: Secondary | ICD-10-CM

## 2013-04-02 LAB — CBC WITH DIFFERENTIAL (CANCER CENTER ONLY)
BASO%: 0.6 % (ref 0.0–2.0)
HCT: 44.5 % (ref 34.8–46.6)
LYMPH#: 3 10*3/uL (ref 0.9–3.3)
MONO#: 0.5 10*3/uL (ref 0.1–0.9)
NEUT#: 4.8 10*3/uL (ref 1.5–6.5)
Platelets: 263 10*3/uL (ref 145–400)
RDW: 13.6 % (ref 11.1–15.7)
WBC: 8.5 10*3/uL (ref 3.9–10.0)

## 2013-04-02 NOTE — Progress Notes (Signed)
This office note has been dictated.

## 2013-04-03 LAB — COMPREHENSIVE METABOLIC PANEL
ALT: 16 U/L (ref 0–35)
AST: 17 U/L (ref 0–37)
CO2: 27 mEq/L (ref 19–32)
Calcium: 10 mg/dL (ref 8.4–10.5)
Chloride: 105 mEq/L (ref 96–112)
Potassium: 4.4 mEq/L (ref 3.5–5.3)
Sodium: 143 mEq/L (ref 135–145)
Total Protein: 7.3 g/dL (ref 6.0–8.3)

## 2013-04-03 LAB — VITAMIN D 25 HYDROXY (VIT D DEFICIENCY, FRACTURES): Vit D, 25-Hydroxy: 45 ng/mL (ref 30–89)

## 2013-04-03 NOTE — Progress Notes (Signed)
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  CURRENT THERAPY:  Observation.  INTERIM HISTORY:  [name] comes in for follow-up.  We see her twice a year.  She did well over the wintertime.  She went down to [place].  I think she will be heading down there again.  She has had no fatigue or weakness.  There has been no bony pain.  There has been no cough or shortness breath.  There has been no change in bowel or bladder habits.  PHYSICAL EXAMINATION:  General:  This is a well-developed, well- nourished white female in no obvious distress.  Vital Signs: Temperature 98.2, pulse 69, respiratory rate 18, blood pressure 104/55, weight 162.  Head/Neck:  Normocephalic, atraumatic skull.  There are no ocular or oral lesions.  Neck:  There are no palpable cervical or supraclavicular lymph nodes.  Lungs:  Clear bilaterally.  Cardiac: Regular rate and rhythm with a normal S1 and S2.  There are no murmurs, rubs or bruits.  Abdomen:  Soft, with good bowel sounds.  There is no palpable abdominal mass.  There is no fluid wave.  There is no palpable hepatosplenomegaly.  Breasts:  Shows right breast with no masses, edema or erythema.  There is no right axillary adenopathy.  Left breast shows well-healed lumpectomy at the 7 o'clock position.  No distinct mass noted in the left breast.  There is no left axillary adenopathy.  Back: Shows no tenderness over the spine, ribs or hips.  Extremities:  Show no clubbing, cyanosis or edema.  Neurologic:  Shows no focal neurological deficits.  Skin:  No rashes, ecchymosis or petechia.  LABORATORY STUDIES:  White cell count 8.5, hemoglobin 15.5, hematocrit 44.5, platelet count 263.  IMPRESSION:  [name] is a 78 year old white female with history of stage I infiltrating ductal carcinoma of the left breast.  She is now 7 years.  She completed 5 years of Femara back in June 2013.  We will still see her every 6 months.  I do not see a  need for any x-ray tests that need to be done.    ______________________________ Josph Macho, M.D. PRE/MEDQ  D:  04/02/2013  T:  04/03/2013  Job:  [id number]

## 2013-04-04 ENCOUNTER — Telehealth: Payer: Self-pay | Admitting: *Deleted

## 2013-04-04 NOTE — Telephone Encounter (Signed)
Called patient to let her know that her labs and vitamin d are ok per dr. Myna Hidalgo

## 2013-04-04 NOTE — Telephone Encounter (Signed)
Message copied by Anselm Jungling on Fri Apr 04, 2013 12:29 PM ------      Message from: Arlan Organ R      Created: Thu Apr 03, 2013  5:18 PM       Please call and tell her labs and vitamin D are okay. Thanks. Pete ------

## 2013-04-10 ENCOUNTER — Other Ambulatory Visit: Payer: Medicare Other | Admitting: Lab

## 2013-04-10 ENCOUNTER — Ambulatory Visit: Payer: Medicare Other | Admitting: Hematology & Oncology

## 2013-05-01 ENCOUNTER — Other Ambulatory Visit: Payer: Medicare Other | Admitting: Lab

## 2013-05-01 ENCOUNTER — Ambulatory Visit: Payer: Medicare Other | Admitting: Hematology & Oncology

## 2013-06-10 LAB — HM DIABETES EYE EXAM

## 2013-08-27 LAB — HM MAMMOGRAPHY

## 2013-09-30 ENCOUNTER — Telehealth: Payer: Self-pay | Admitting: Hematology & Oncology

## 2013-09-30 NOTE — Telephone Encounter (Signed)
Patient called and cx 10/02/13 apt and stated she will call back to resch

## 2013-10-01 ENCOUNTER — Other Ambulatory Visit: Payer: Medicare Other | Admitting: Lab

## 2013-10-01 ENCOUNTER — Ambulatory Visit: Payer: Medicare Other | Admitting: Hematology & Oncology

## 2013-10-02 ENCOUNTER — Ambulatory Visit: Payer: Medicare Other | Admitting: Hematology & Oncology

## 2013-10-02 ENCOUNTER — Other Ambulatory Visit: Payer: Medicare Other | Admitting: Lab

## 2013-12-31 ENCOUNTER — Telehealth: Payer: Self-pay | Admitting: Hematology & Oncology

## 2013-12-31 NOTE — Telephone Encounter (Signed)
Pt made 4-15 appointment

## 2014-02-11 ENCOUNTER — Other Ambulatory Visit (HOSPITAL_BASED_OUTPATIENT_CLINIC_OR_DEPARTMENT_OTHER): Payer: Medicare Other | Admitting: Lab

## 2014-02-11 ENCOUNTER — Ambulatory Visit (HOSPITAL_BASED_OUTPATIENT_CLINIC_OR_DEPARTMENT_OTHER): Payer: Medicare Other | Admitting: Hematology & Oncology

## 2014-02-11 ENCOUNTER — Encounter: Payer: Self-pay | Admitting: Hematology & Oncology

## 2014-02-11 VITALS — BP 151/58 | HR 56 | Temp 98.0°F | Resp 14 | Ht 63.0 in | Wt 176.0 lb

## 2014-02-11 DIAGNOSIS — Z853 Personal history of malignant neoplasm of breast: Secondary | ICD-10-CM

## 2014-02-11 DIAGNOSIS — C50919 Malignant neoplasm of unspecified site of unspecified female breast: Secondary | ICD-10-CM

## 2014-02-11 DIAGNOSIS — M810 Age-related osteoporosis without current pathological fracture: Secondary | ICD-10-CM

## 2014-02-11 DIAGNOSIS — C50911 Malignant neoplasm of unspecified site of right female breast: Secondary | ICD-10-CM

## 2014-02-11 DIAGNOSIS — Z170 Estrogen receptor positive status [ER+]: Principal | ICD-10-CM

## 2014-02-11 LAB — CBC WITH DIFFERENTIAL (CANCER CENTER ONLY)
BASO#: 0 10*3/uL (ref 0.0–0.2)
BASO%: 0.4 % (ref 0.0–2.0)
EOS ABS: 0.2 10*3/uL (ref 0.0–0.5)
EOS%: 2 % (ref 0.0–7.0)
HEMATOCRIT: 45.4 % (ref 34.8–46.6)
HEMOGLOBIN: 15.9 g/dL (ref 11.6–15.9)
LYMPH#: 2.6 10*3/uL (ref 0.9–3.3)
LYMPH%: 27.3 % (ref 14.0–48.0)
MCH: 30.6 pg (ref 26.0–34.0)
MCHC: 35 g/dL (ref 32.0–36.0)
MCV: 87 fL (ref 81–101)
MONO#: 0.6 10*3/uL (ref 0.1–0.9)
MONO%: 6.6 % (ref 0.0–13.0)
NEUT#: 6 10*3/uL (ref 1.5–6.5)
NEUT%: 63.7 % (ref 39.6–80.0)
PLATELETS: 241 10*3/uL (ref 145–400)
RBC: 5.2 10*6/uL (ref 3.70–5.32)
RDW: 13.5 % (ref 11.1–15.7)
WBC: 9.5 10*3/uL (ref 3.9–10.0)

## 2014-02-11 NOTE — Progress Notes (Signed)
Hematology and Oncology Follow Up Visit  [name] [id number] Jul 02, 1935 79 y.o. 02/11/2014   Principle Diagnosis:   Stage I (T1cN0M0) ductal carcinoma left breast  Current Therapy:    Observation     Interim History:  [name] is back for followup. She missed her December appointment. Husband has lymphoma recurrence. He's down to [place] he treated. This is where he is from. She came up to see [name]. She probably will go back down in another couple weeks.  She is doing well. She's had no bony pain. There is no cough or shortness of breath. She's had no change in bowel or bladder habits. She's had no bleeding. There's been no infections. There's been no leg swelling.  She has diabetes. This is being managed by her family doctor.  Her last mammogram was back in October. There is a looked fine.  Medications: Current outpatient prescriptions:aspirin 81 MG tablet, Take 81 mg by mouth daily.  , Disp: , Rfl: ;  B Complex-C (SUPER B COMPLEX) TABS, Take 1 tablet by mouth daily.  , Disp: , Rfl: ;  Calcium Carbonate-Vitamin D (CALTRATE 600+D) 600-400 MG-UNIT per chew tablet, Chew 1 tablet by mouth 2 (two) times daily. , Disp: , Rfl: ;  Cholecalciferol (VITAMIN D-3) 1000 UNITS CAPS, Take by mouth every morning., Disp: , Rfl:  CINNAMON PO, Take 1,000 capsules by mouth 2 (two) times daily.  , Disp: , Rfl: ;  fish oil-omega-3 fatty acids 1000 MG capsule, Take 2 g by mouth daily.  , Disp: , Rfl: ;  lisinopril (PRINIVIL,ZESTRIL) 5 MG tablet, Take 5 mg by mouth daily.  , Disp: , Rfl: ;  metFORMIN (GLUCOPHAGE) 500 MG tablet, Take 500 mg by mouth 2 (two) times daily with a meal.  , Disp: , Rfl:  rosuvastatin (CRESTOR) 10 MG tablet, Take 10 mg by mouth once a week.  , Disp: , Rfl:   Allergies: No Known Allergies  Past Medical History, Surgical history, Social history, and Family History were reviewed and updated.  Review of Systems: As above  Physical Exam:  height is 5\' 3"  (1.6 m) and weight is  176 lb (79.833 kg). Her oral temperature is 98 F (36.7 C). Her blood pressure is 151/58 and her pulse is 56. Her respiration is 14.   Well-developed and well-nourished white female. Head and neck exam shows no adenopathy in the neck. There is no ocular or oral lesions. She has no palpable thyroid. Lungs are clear. Cardiac exam regular in rhythm with no murmurs rubs or bruits. Breast exam shows right breast no masses edema or erythema. There is no right axillary adenopathy. Left breast side effect from radiation surgery. His well-healed lumpectomy scar the 7:00 position. No obvious mass is noted in the left breast. There is no left axillary adenopathy. Abdomen is soft. She has good bowel sounds. There is no fluid wave. There is no palpable liver or spleen. Back exam no tenderness over the spine ribs or hips. Extremities shows no clubbing cyanosis or edema. She has good range of motion of her joints. Neurological exam no focal deficits.  Lab Results  Component Value Date   WBC 9.5 02/11/2014   HGB 15.9 02/11/2014   HCT 45.4 02/11/2014   MCV 87 02/11/2014   PLT 241 02/11/2014     Chemistry      Component Value Date/Time   NA 143 04/02/2013 1359   K 4.4 04/02/2013 1359   CL 105 04/02/2013 1359   CO2  27 04/02/2013 1359   BUN 11 04/02/2013 1359   CREATININE 0.70 04/02/2013 1359      Component Value Date/Time   CALCIUM 10.0 04/02/2013 1359   ALKPHOS 69 04/02/2013 1359   AST 17 04/02/2013 1359   ALT 16 04/02/2013 1359   BILITOT 0.5 04/02/2013 1359         Impression and Plan: [name] is 79 year old white female. She has a stage I ductal carcinoma the left breast. Her tumor is ER positive. She had lumpectomy and radiation. She had 5 years of Femara. She completed this in June of 2013.  We will go ahead and plan to get her back in 6 months or so for followup.  She is due for a mammogram when she comes back.  We will certainly pray for her and her husband.   Volanda Napoleon, MD 4/15/20154:03 PM

## 2014-02-12 ENCOUNTER — Telehealth: Payer: Self-pay | Admitting: *Deleted

## 2014-02-12 LAB — COMPREHENSIVE METABOLIC PANEL
ALBUMIN: 3.9 g/dL (ref 3.5–5.2)
ALT: 30 U/L (ref 0–35)
AST: 25 U/L (ref 0–37)
Alkaline Phosphatase: 75 U/L (ref 39–117)
BILIRUBIN TOTAL: 0.5 mg/dL (ref 0.2–1.2)
BUN: 9 mg/dL (ref 6–23)
CO2: 26 meq/L (ref 19–32)
Calcium: 10.6 mg/dL — ABNORMAL HIGH (ref 8.4–10.5)
Chloride: 104 mEq/L (ref 96–112)
Creatinine, Ser: 0.79 mg/dL (ref 0.50–1.10)
GLUCOSE: 116 mg/dL — AB (ref 70–99)
POTASSIUM: 3.7 meq/L (ref 3.5–5.3)
SODIUM: 139 meq/L (ref 135–145)
TOTAL PROTEIN: 7.1 g/dL (ref 6.0–8.3)

## 2014-02-12 LAB — VITAMIN D 25 HYDROXY (VIT D DEFICIENCY, FRACTURES): Vit D, 25-Hydroxy: 49 ng/mL (ref 30–89)

## 2014-02-12 NOTE — Telephone Encounter (Addendum)
Message copied by Lenn Sink on Thu Feb 12, 2014 11:11 AM ------      Message from: Burney Gauze R      Created: Wed Feb 11, 2014  9:33 PM       Call - labs are ok!!! Laurey Arrow ------Left voicemail informing patient that labs are okay.

## 2014-08-12 ENCOUNTER — Ambulatory Visit (HOSPITAL_BASED_OUTPATIENT_CLINIC_OR_DEPARTMENT_OTHER): Payer: Medicare Other | Admitting: Hematology & Oncology

## 2014-08-12 ENCOUNTER — Encounter: Payer: Self-pay | Admitting: Hematology & Oncology

## 2014-08-12 ENCOUNTER — Other Ambulatory Visit (HOSPITAL_BASED_OUTPATIENT_CLINIC_OR_DEPARTMENT_OTHER): Payer: Medicare Other | Admitting: Lab

## 2014-08-12 VITALS — BP 125/58 | HR 75 | Temp 97.8°F | Resp 14 | Ht 63.0 in | Wt 170.0 lb

## 2014-08-12 DIAGNOSIS — C50919 Malignant neoplasm of unspecified site of unspecified female breast: Secondary | ICD-10-CM

## 2014-08-12 DIAGNOSIS — Z853 Personal history of malignant neoplasm of breast: Secondary | ICD-10-CM

## 2014-08-12 DIAGNOSIS — Z170 Estrogen receptor positive status [ER+]: Principal | ICD-10-CM

## 2014-08-12 DIAGNOSIS — E119 Type 2 diabetes mellitus without complications: Secondary | ICD-10-CM

## 2014-08-12 DIAGNOSIS — C50912 Malignant neoplasm of unspecified site of left female breast: Secondary | ICD-10-CM

## 2014-08-12 LAB — CMP (CANCER CENTER ONLY)
ALBUMIN: 4.1 g/dL (ref 3.3–5.5)
ALT: 37 U/L (ref 10–47)
AST: 30 U/L (ref 11–38)
Alkaline Phosphatase: 56 U/L (ref 26–84)
BUN, Bld: 11 mg/dL (ref 7–22)
CALCIUM: 10.3 mg/dL (ref 8.0–10.3)
CHLORIDE: 106 meq/L (ref 98–108)
CO2: 27 mEq/L (ref 18–33)
Creat: 0.5 mg/dl — ABNORMAL LOW (ref 0.6–1.2)
Glucose, Bld: 88 mg/dL (ref 73–118)
POTASSIUM: 4.1 meq/L (ref 3.3–4.7)
SODIUM: 142 meq/L (ref 128–145)
Total Bilirubin: 0.7 mg/dl (ref 0.20–1.60)
Total Protein: 7.3 g/dL (ref 6.4–8.1)

## 2014-08-12 LAB — CBC WITH DIFFERENTIAL (CANCER CENTER ONLY)
BASO#: 0 10*3/uL (ref 0.0–0.2)
BASO%: 0.4 % (ref 0.0–2.0)
EOS%: 1.5 % (ref 0.0–7.0)
Eosinophils Absolute: 0.1 10*3/uL (ref 0.0–0.5)
HCT: 45.9 % (ref 34.8–46.6)
HGB: 16.4 g/dL — ABNORMAL HIGH (ref 11.6–15.9)
LYMPH#: 2.9 10*3/uL (ref 0.9–3.3)
LYMPH%: 39.9 % (ref 14.0–48.0)
MCH: 31.8 pg (ref 26.0–34.0)
MCHC: 35.7 g/dL (ref 32.0–36.0)
MCV: 89 fL (ref 81–101)
MONO#: 0.5 10*3/uL (ref 0.1–0.9)
MONO%: 6.7 % (ref 0.0–13.0)
NEUT#: 3.8 10*3/uL (ref 1.5–6.5)
NEUT%: 51.5 % (ref 39.6–80.0)
PLATELETS: 238 10*3/uL (ref 145–400)
RBC: 5.16 10*6/uL (ref 3.70–5.32)
RDW: 13.5 % (ref 11.1–15.7)
WBC: 7.3 10*3/uL (ref 3.9–10.0)

## 2014-08-12 LAB — LACTATE DEHYDROGENASE: LDH: 124 U/L (ref 94–250)

## 2014-08-12 NOTE — Progress Notes (Signed)
Hematology and Oncology Follow Up Visit  [name] [id number] 03-10-1935 79 y.o. 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 We last saw her in April. Since then, her husband is now lymphoma free. He was treated down in [place]. This really is a big relief for her.   She is doing well. She's had no bony pain. There is no cough or shortness of breath. She's had no change in bowel or bladder habits. She's had no bleeding. There's been no infections. There's been no leg swelling.  She has diabetes. This is being managed by her family doctor.  Her last mammogram was back in October. There is a looked fine.  Medications: Current outpatient prescriptions:aspirin 81 MG tablet, Take 81 mg by mouth daily.  , Disp: , Rfl: ;  B Complex-C (SUPER B COMPLEX) TABS, Take 1 tablet by mouth daily.  , Disp: , Rfl: ;  Calcium Carb-Cholecalciferol (CALCIUM + D3 PO), Take by mouth every morning., Disp: , Rfl: ;  Cholecalciferol (VITAMIN D-3) 1000 UNITS CAPS, Take by mouth every morning., Disp: , Rfl:  metFORMIN (GLUCOPHAGE) 500 MG tablet, Take 500 mg by mouth 2 (two) times daily with a meal. 2-- 500 mg  Tabs in AM and PM, Disp: , Rfl: ;  rosuvastatin (CRESTOR) 10 MG tablet, Take 10 mg by mouth once a week.  , Disp: , Rfl:   Allergies: No Known Allergies  Past Medical History, Surgical history, Social history, and Family History were reviewed and updated.  Review of Systems: As above  Physical Exam:  height is 5\' 3"  (1.6 m) and weight is 170 lb (77.111 kg). Her oral temperature is 97.8 F (36.6 C). Her blood pressure is 125/58 and her pulse is 75. Her respiration is 14.   Well-developed and well-nourished white female. Head and neck exam shows no adenopathy in the neck. There is no ocular or oral lesions. She has no palpable thyroid. Lungs are clear. Cardiac exam  regular in rhythm with no murmurs rubs or bruits. Breast exam shows right breast no masses edema or erythema. There is no right axillary adenopathy. Left breast side effect from radiation surgery. His well-healed lumpectomy scar the 7:00 position. No obvious mass is noted in the left breast. There is no left axillary adenopathy. Abdomen is soft. She has good bowel sounds. There is no fluid wave. There is no palpable liver or spleen. Back exam no tenderness over the spine ribs or hips. Extremities shows no clubbing cyanosis or edema. She has good range of motion of her joints. Neurological exam no focal deficits.  Lab Results  Component Value Date   WBC 7.3 08/12/2014   HGB 16.4* 08/12/2014   HCT 45.9 08/12/2014   MCV 89 08/12/2014   PLT 238 08/12/2014     Chemistry      Component Value Date/Time   NA 142 08/12/2014 1330   NA 139 02/11/2014 1435   K 4.1 08/12/2014 1330   K 3.7 02/11/2014 1435   CL 106 08/12/2014 1330   CL 104 02/11/2014 1435   CO2 27 08/12/2014 1330   CO2 26 02/11/2014 1435   BUN 11 08/12/2014 1330   BUN 9 02/11/2014 1435   CREATININE 0.5* 08/12/2014 1330   CREATININE 0.79 02/11/2014 1435      Component Value Date/Time   CALCIUM 10.3 08/12/2014 1330  CALCIUM 10.6* 02/11/2014 1435   ALKPHOS 56 08/12/2014 1330   ALKPHOS 75 02/11/2014 1435   AST 30 08/12/2014 1330   AST 25 02/11/2014 1435   ALT 37 08/12/2014 1330   ALT 30 02/11/2014 1435   BILITOT 0.70 08/12/2014 1330   BILITOT 0.5 02/11/2014 1435         Impression and Plan: [name] is 79 year old white female. She has a stage I ductal carcinoma the left breast. Her tumor is ER positive. She had lumpectomy and radiation. She had 5 years of Femara. She completed this in June of 2013.  We will go ahead and plan to get her back in 6 months or so for followup.  I'm just glad she is doing well. I am glad that her husband is doing well. This is a big relief for her.  She says that when her husband comes up to  visit over the holidays, and he may need to have his [place] flushed. I told her that we would be more having to do this for them.  Brenda Napoleon, MD 10/14/20156:17 PM

## 2014-08-13 ENCOUNTER — Telehealth: Payer: Self-pay | Admitting: Nurse Practitioner

## 2014-08-13 NOTE — Telephone Encounter (Addendum)
Message copied by Jimmy Footman on Thu Aug 13, 2014  5:48 PM ------      Message from: Burney Gauze R      Created: Wed Aug 12, 2014  7:01 PM       Please call and let her know that he labs are very good. Pete ------LVM on pt's personal machine and informed her of the above message.

## 2014-08-14 ENCOUNTER — Encounter: Payer: Self-pay | Admitting: Nurse Practitioner

## 2014-08-28 LAB — HM MAMMOGRAPHY

## 2014-12-02 DIAGNOSIS — E1165 Type 2 diabetes mellitus with hyperglycemia: Secondary | ICD-10-CM | POA: Diagnosis not present

## 2014-12-04 DIAGNOSIS — E1165 Type 2 diabetes mellitus with hyperglycemia: Secondary | ICD-10-CM | POA: Diagnosis not present

## 2015-01-08 ENCOUNTER — Telehealth: Payer: Self-pay | Admitting: Hematology & Oncology

## 2015-01-08 NOTE — Telephone Encounter (Signed)
Left pt message moved time of 4-13 appointment

## 2015-02-10 ENCOUNTER — Ambulatory Visit: Payer: Medicare Other | Admitting: Hematology & Oncology

## 2015-02-10 ENCOUNTER — Other Ambulatory Visit: Payer: Medicare Other

## 2015-02-11 ENCOUNTER — Encounter: Payer: Self-pay | Admitting: Hematology & Oncology

## 2015-02-11 ENCOUNTER — Ambulatory Visit (HOSPITAL_BASED_OUTPATIENT_CLINIC_OR_DEPARTMENT_OTHER): Payer: Medicare Other | Admitting: Hematology & Oncology

## 2015-02-11 ENCOUNTER — Other Ambulatory Visit (HOSPITAL_BASED_OUTPATIENT_CLINIC_OR_DEPARTMENT_OTHER): Payer: Medicare Other

## 2015-02-11 DIAGNOSIS — Z170 Estrogen receptor positive status [ER+]: Secondary | ICD-10-CM

## 2015-02-11 DIAGNOSIS — C50912 Malignant neoplasm of unspecified site of left female breast: Secondary | ICD-10-CM | POA: Diagnosis not present

## 2015-02-11 DIAGNOSIS — Z86000 Personal history of in-situ neoplasm of breast: Secondary | ICD-10-CM | POA: Diagnosis not present

## 2015-02-11 DIAGNOSIS — D0591 Unspecified type of carcinoma in situ of right breast: Secondary | ICD-10-CM

## 2015-02-11 DIAGNOSIS — Z79811 Long term (current) use of aromatase inhibitors: Secondary | ICD-10-CM | POA: Diagnosis not present

## 2015-02-11 DIAGNOSIS — M6529 Calcific tendinitis, multiple sites: Secondary | ICD-10-CM | POA: Diagnosis not present

## 2015-02-11 DIAGNOSIS — M549 Dorsalgia, unspecified: Secondary | ICD-10-CM | POA: Diagnosis not present

## 2015-02-11 DIAGNOSIS — Z923 Personal history of irradiation: Secondary | ICD-10-CM

## 2015-02-11 LAB — CMP (CANCER CENTER ONLY)
ALT(SGPT): 32 U/L (ref 10–47)
AST: 25 U/L (ref 11–38)
Albumin: 3.9 g/dL (ref 3.3–5.5)
Alkaline Phosphatase: 60 U/L (ref 26–84)
BUN: 11 mg/dL (ref 7–22)
CALCIUM: 11.5 mg/dL — AB (ref 8.0–10.3)
CHLORIDE: 108 meq/L (ref 98–108)
CO2: 28 meq/L (ref 18–33)
CREATININE: 0.8 mg/dL (ref 0.6–1.2)
GLUCOSE: 103 mg/dL (ref 73–118)
Potassium: 4 mEq/L (ref 3.3–4.7)
Sodium: 143 mEq/L (ref 128–145)
Total Bilirubin: 0.7 mg/dl (ref 0.20–1.60)
Total Protein: 7.3 g/dL (ref 6.4–8.1)

## 2015-02-11 LAB — CBC WITH DIFFERENTIAL (CANCER CENTER ONLY)
BASO#: 0 10*3/uL (ref 0.0–0.2)
BASO%: 0.6 % (ref 0.0–2.0)
EOS ABS: 0.1 10*3/uL (ref 0.0–0.5)
EOS%: 1.7 % (ref 0.0–7.0)
HEMATOCRIT: 45.2 % (ref 34.8–46.6)
HEMOGLOBIN: 16 g/dL — AB (ref 11.6–15.9)
LYMPH#: 2.9 10*3/uL (ref 0.9–3.3)
LYMPH%: 40.4 % (ref 14.0–48.0)
MCH: 30.9 pg (ref 26.0–34.0)
MCHC: 35.4 g/dL (ref 32.0–36.0)
MCV: 87 fL (ref 81–101)
MONO#: 0.5 10*3/uL (ref 0.1–0.9)
MONO%: 6.3 % (ref 0.0–13.0)
NEUT%: 51 % (ref 39.6–80.0)
NEUTROS ABS: 3.6 10*3/uL (ref 1.5–6.5)
Platelets: 252 10*3/uL (ref 145–400)
RBC: 5.18 10*6/uL (ref 3.70–5.32)
RDW: 13.3 % (ref 11.1–15.7)
WBC: 7.1 10*3/uL (ref 3.9–10.0)

## 2015-02-11 NOTE — Progress Notes (Signed)
Hematology and Oncology Follow Up Visit  BARBEE MAMULA [id number] [birth year] 80 y.o. 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  Lichtenwalner is back for followup. Shockingly, her husband passed away 5 weeks ago. This is her second husband. I took care of her first husband about 10 or so years ago who had esophageal cancer.  Her second husband had a lymphoma that came back and once he came back, he declined it very quickly. They were down in [place] when he passed away. They basically were living in [place] at the time.  It has been tough for her the past few weeks.  She will be going on a bus trip up to [place] in May. She'll be gone for a couple weeks. She is looking forward to this. She is having a lot of lower back issues. The right lower back is causing a lot of problems. She has had surgery. She will see her family doctor later on this week for reevaluation. I will defer to him as to whether or not she needs x-rays.  She has had no cough. She has had no nausea or vomiting. There has been no change in bowel or bladder habits. She has had no leg swelling.  There's been no rashes.  Her last mammogram was last year.  Medications:  Current outpatient prescriptions:  .  aspirin 81 MG tablet, Take 81 mg by mouth daily.  , Disp: , Rfl:  .  B Complex-C (SUPER B COMPLEX) TABS, Take 1 tablet by mouth daily.  , Disp: , Rfl:  .  Calcium Carb-Cholecalciferol (CALCIUM + D3 PO), Take by mouth every morning., Disp: , Rfl:  .  Cholecalciferol (VITAMIN D-3) 1000 UNITS CAPS, Take by mouth every morning., Disp: , Rfl:  .  losartan (COZAAR) 50 MG tablet, Take 50 mg by mouth daily., Disp: , Rfl:  .  metFORMIN (GLUCOPHAGE) 500 MG tablet, Take 1,000 mg by mouth 2 (two) times daily with a meal. 2-- 1000 mg  Tabs in AM and PM, Disp: , Rfl:  .  Omega-3 Fatty Acids (FISH OIL BURP-LESS PO), Take by mouth  at bedtime., Disp: , Rfl:  .  rosuvastatin (CRESTOR) 10 MG tablet, Take 10 mg by mouth once a week.  , Disp: , Rfl:   Allergies: No Known Allergies  Past Medical History, Surgical history, Social history, and Family History were reviewed and updated.  Review of Systems: As above  Physical Exam:  height is 5\' 3"  (1.6 m) and weight is 168 lb (76.204 kg). Her oral temperature is 97.7 F (36.5 C). Her blood pressure is 164/66 and her pulse is 67. Her respiration is 16.   Well-developed and well-nourished white female. Head and neck exam shows no adenopathy in the neck. There is no ocular or oral lesions. She has no palpable thyroid. Lungs are clear. Cardiac exam regular rate and rhythm with no murmurs rubs or bruits. Breast exam shows right breast no masses edema or erythema. There is no right axillary adenopathy. Left breast side effect from radiation surgery. His well-healed lumpectomy scar the 7:00 position. No obvious mass is noted in the left breast. There is no left axillary adenopathy. Abdomen is soft. She has good bowel sounds. There is no fluid wave. There is no palpable liver or spleen. Back exam no tenderness over the spine ribs or hips. Extremities shows no  clubbing cyanosis or edema. She has good range of motion of her joints. Neurological exam no focal deficits.  Lab Results  Component Value Date   WBC 7.1 02/11/2015   HGB 16.0* 02/11/2015   HCT 45.2 02/11/2015   MCV 87 02/11/2015   PLT 252 02/11/2015     Chemistry      Component Value Date/Time   NA 143 02/11/2015 1100   NA 139 02/11/2014 1435   K 4.0 02/11/2015 1100   K 3.7 02/11/2014 1435   CL 108 02/11/2015 1100   CL 104 02/11/2014 1435   CO2 28 02/11/2015 1100   CO2 26 02/11/2014 1435   BUN 11 02/11/2015 1100   BUN 9 02/11/2014 1435   CREATININE 0.8 02/11/2015 1100   CREATININE 0.79 02/11/2014 1435      Component Value Date/Time   CALCIUM 11.5* 02/11/2015 1100   CALCIUM 10.6* 02/11/2014 1435   ALKPHOS 60  02/11/2015 1100   ALKPHOS 75 02/11/2014 1435   AST 25 02/11/2015 1100   AST 25 02/11/2014 1435   ALT 32 02/11/2015 1100   ALT 30 02/11/2014 1435   BILITOT 0.70 02/11/2015 1100   BILITOT 0.5 02/11/2014 1435         Impression and Plan: Ms. Wolff is 80 year old white female. She has a stage I ductal carcinoma the left breast. Her tumor is ER positive. She had lumpectomy and radiation. She had 5 years of Femara. She completed this in June of 2013.  I am a little bit surprised about her calcium. It is on the high side. When corrected for the albumin, it does not come down. She is asymptomatic with this. I told her to stop taking supplemental calcium. She is to on vitamin D. Her level ago was okay.  I want to repeat her electrolytes in one month.  I still want to see her back in 6 months  Volanda Napoleon, MD 4/14/20164:45 PM

## 2015-02-12 ENCOUNTER — Other Ambulatory Visit: Payer: Self-pay | Admitting: Internal Medicine

## 2015-02-12 ENCOUNTER — Other Ambulatory Visit: Payer: Self-pay

## 2015-02-12 ENCOUNTER — Ambulatory Visit
Admission: RE | Admit: 2015-02-12 | Discharge: 2015-02-12 | Disposition: A | Discharge status: Home / Self Care | Payer: Medicare Other | Source: Ambulatory Visit | Attending: Internal Medicine | Admitting: Internal Medicine

## 2015-02-12 DIAGNOSIS — M5137 Other intervertebral disc degeneration, lumbosacral region: Secondary | ICD-10-CM | POA: Diagnosis not present

## 2015-02-12 DIAGNOSIS — M4186 Other forms of scoliosis, lumbar region: Secondary | ICD-10-CM | POA: Diagnosis not present

## 2015-02-12 DIAGNOSIS — M549 Dorsalgia, unspecified: Secondary | ICD-10-CM

## 2015-02-12 DIAGNOSIS — M47817 Spondylosis without myelopathy or radiculopathy, lumbosacral region: Secondary | ICD-10-CM | POA: Diagnosis not present

## 2015-03-11 ENCOUNTER — Other Ambulatory Visit (HOSPITAL_BASED_OUTPATIENT_CLINIC_OR_DEPARTMENT_OTHER): Payer: Medicare Other

## 2015-03-11 ENCOUNTER — Other Ambulatory Visit: Payer: Medicare Other

## 2015-03-11 DIAGNOSIS — D0591 Unspecified type of carcinoma in situ of right breast: Secondary | ICD-10-CM

## 2015-03-11 DIAGNOSIS — Z86000 Personal history of in-situ neoplasm of breast: Secondary | ICD-10-CM | POA: Diagnosis not present

## 2015-03-11 LAB — CBC WITH DIFFERENTIAL (CANCER CENTER ONLY)
BASO#: 0.1 10*3/uL (ref 0.0–0.2)
BASO%: 0.8 % (ref 0.0–2.0)
EOS%: 2.8 % (ref 0.0–7.0)
Eosinophils Absolute: 0.2 10*3/uL (ref 0.0–0.5)
HEMATOCRIT: 43.3 % (ref 34.8–46.6)
HGB: 15.5 g/dL (ref 11.6–15.9)
LYMPH#: 2.4 10*3/uL (ref 0.9–3.3)
LYMPH%: 39.5 % (ref 14.0–48.0)
MCH: 31.4 pg (ref 26.0–34.0)
MCHC: 35.8 g/dL (ref 32.0–36.0)
MCV: 88 fL (ref 81–101)
MONO#: 0.4 10*3/uL (ref 0.1–0.9)
MONO%: 6.1 % (ref 0.0–13.0)
NEUT#: 3.1 10*3/uL (ref 1.5–6.5)
NEUT%: 50.8 % (ref 39.6–80.0)
Platelets: 228 10*3/uL (ref 145–400)
RBC: 4.93 10*6/uL (ref 3.70–5.32)
RDW: 13.5 % (ref 11.1–15.7)
WBC: 6.2 10*3/uL (ref 3.9–10.0)

## 2015-03-11 LAB — COMPREHENSIVE METABOLIC PANEL
ALBUMIN: 4 g/dL (ref 3.5–5.2)
ALK PHOS: 63 U/L (ref 39–117)
ALT: 32 U/L (ref 0–35)
AST: 22 U/L (ref 0–37)
BUN: 11 mg/dL (ref 6–23)
CO2: 22 mEq/L (ref 19–32)
Calcium: 10.1 mg/dL (ref 8.4–10.5)
Chloride: 106 mEq/L (ref 96–112)
Creatinine, Ser: 0.73 mg/dL (ref 0.50–1.10)
Glucose, Bld: 164 mg/dL — ABNORMAL HIGH (ref 70–99)
POTASSIUM: 3.6 meq/L (ref 3.5–5.3)
Sodium: 140 mEq/L (ref 135–145)
TOTAL PROTEIN: 7 g/dL (ref 6.0–8.3)
Total Bilirubin: 0.6 mg/dL (ref 0.2–1.2)

## 2015-03-12 LAB — VITAMIN D 25 HYDROXY (VIT D DEFICIENCY, FRACTURES): Vit D, 25-Hydroxy: 29 ng/mL — ABNORMAL LOW (ref 30–100)

## 2015-03-15 ENCOUNTER — Telehealth: Payer: Self-pay | Admitting: *Deleted

## 2015-03-15 NOTE — Telephone Encounter (Addendum)
Patient was taking 1000 units daily. She will increase to 2000 units daily per Dr Marin Olp.  ----- Message from Volanda Napoleon, MD sent at 03/12/2015  5:27 PM EDT ----- Call and tell her that the vitamin D level is low. How much is she taking? Thanks

## 2015-04-23 ENCOUNTER — Telehealth: Payer: Self-pay | Admitting: *Deleted

## 2015-04-23 NOTE — Telephone Encounter (Signed)
Patient is supposed to be taking 2000 units of vitamin D daily. She wanted to know if this could be part of a multivitamin or had to be purely vitamin D. Patient instructed that the vitamin D could be part of a multivitamin and to just ensure that she's taking 2000 units a day. She was appreciative for the information.

## 2015-08-11 ENCOUNTER — Other Ambulatory Visit: Payer: Self-pay

## 2015-08-11 DIAGNOSIS — D0512 Intraductal carcinoma in situ of left breast: Secondary | ICD-10-CM

## 2015-08-12 ENCOUNTER — Other Ambulatory Visit: Payer: Medicare Other

## 2015-08-12 ENCOUNTER — Other Ambulatory Visit (HOSPITAL_BASED_OUTPATIENT_CLINIC_OR_DEPARTMENT_OTHER): Payer: Medicare Other

## 2015-08-12 ENCOUNTER — Ambulatory Visit (HOSPITAL_BASED_OUTPATIENT_CLINIC_OR_DEPARTMENT_OTHER): Payer: Medicare Other

## 2015-08-12 ENCOUNTER — Encounter: Payer: Self-pay | Admitting: Hematology & Oncology

## 2015-08-12 ENCOUNTER — Ambulatory Visit (HOSPITAL_BASED_OUTPATIENT_CLINIC_OR_DEPARTMENT_OTHER): Payer: Medicare Other | Admitting: Hematology & Oncology

## 2015-08-12 ENCOUNTER — Ambulatory Visit: Payer: Medicare Other | Admitting: Hematology & Oncology

## 2015-08-12 VITALS — BP 135/56 | HR 64 | Temp 97.9°F | Resp 16 | Ht 63.0 in | Wt 175.0 lb

## 2015-08-12 DIAGNOSIS — Z23 Encounter for immunization: Secondary | ICD-10-CM

## 2015-08-12 DIAGNOSIS — Z853 Personal history of malignant neoplasm of breast: Secondary | ICD-10-CM

## 2015-08-12 DIAGNOSIS — C50012 Malignant neoplasm of nipple and areola, left female breast: Secondary | ICD-10-CM

## 2015-08-12 DIAGNOSIS — D0512 Intraductal carcinoma in situ of left breast: Secondary | ICD-10-CM

## 2015-08-12 LAB — COMPREHENSIVE METABOLIC PANEL (CC13)
ALBUMIN: 3.9 g/dL (ref 3.5–5.0)
ALK PHOS: 74 U/L (ref 40–150)
ALT: 32 U/L (ref 0–55)
AST: 26 U/L (ref 5–34)
Anion Gap: 9 mEq/L (ref 3–11)
BILIRUBIN TOTAL: 0.52 mg/dL (ref 0.20–1.20)
BUN: 11.8 mg/dL (ref 7.0–26.0)
CALCIUM: 11.1 mg/dL — AB (ref 8.4–10.4)
CO2: 29 mEq/L (ref 22–29)
Chloride: 107 mEq/L (ref 98–109)
Creatinine: 0.8 mg/dL (ref 0.6–1.1)
EGFR: 70 mL/min/{1.73_m2} — AB (ref >90)
GLUCOSE: 109 mg/dL (ref 70–140)
Potassium: 4.2 mEq/L (ref 3.5–5.1)
SODIUM: 144 meq/L (ref 136–145)
TOTAL PROTEIN: 7.2 g/dL (ref 6.4–8.3)

## 2015-08-12 LAB — CBC WITH DIFFERENTIAL (CANCER CENTER ONLY)
BASO#: 0 10*3/uL (ref 0.0–0.2)
BASO%: 0.6 % (ref 0.0–2.0)
EOS ABS: 0.2 10*3/uL (ref 0.0–0.5)
EOS%: 3.1 % (ref 0.0–7.0)
HEMATOCRIT: 41.9 % (ref 34.8–46.6)
HEMOGLOBIN: 14.5 g/dL (ref 11.6–15.9)
LYMPH#: 2.8 10*3/uL (ref 0.9–3.3)
LYMPH%: 41 % (ref 14.0–48.0)
MCH: 30.7 pg (ref 26.0–34.0)
MCHC: 34.6 g/dL (ref 32.0–36.0)
MCV: 89 fL (ref 81–101)
MONO#: 0.5 10*3/uL (ref 0.1–0.9)
MONO%: 7.2 % (ref 0.0–13.0)
NEUT%: 48.1 % (ref 39.6–80.0)
NEUTROS ABS: 3.2 10*3/uL (ref 1.5–6.5)
Platelets: 278 10*3/uL (ref 145–400)
RBC: 4.73 10*6/uL (ref 3.70–5.32)
RDW: 13.4 % (ref 11.1–15.7)
WBC: 6.7 10*3/uL (ref 3.9–10.0)

## 2015-08-12 LAB — LACTATE DEHYDROGENASE: LDH: 111 U/L (ref 94–250)

## 2015-08-12 MED ORDER — INFLUENZA VAC SPLIT QUAD 0.5 ML IM SUSY
0.5000 mL | PREFILLED_SYRINGE | Freq: Once | INTRAMUSCULAR | Status: AC
  Administered 2015-08-12: 0.5 mL via INTRAMUSCULAR
  Filled 2015-08-12: qty 0.5

## 2015-08-12 NOTE — Patient Instructions (Signed)

## 2015-08-12 NOTE — Progress Notes (Signed)
Hematology and Oncology Follow Up Visit  [name] [id number] [birth year] 80 y.o. 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 is back for followup. She had a good summer. She when I tore up to [place]. She had a good time up there. She really enjoyed herself.  Otherwise, she has had no issues. She had a good summer.  She's on no new medications.  Her blood sugars are being managed by her family doctor. Her blood sugars have been on the higher side.  Her mammogram is due next month.   She's had no problems with cough which shortness of breath. She's had no issues with infections.  His been no change in bowel or bladder habits.  Her husband passed away I think back in 2023/01/03. She is still getting through this.  Medications:  Current outpatient prescriptions:  .  aspirin 81 MG tablet, Take 81 mg by mouth daily.  , Disp: , Rfl:  .  B Complex-C (SUPER B COMPLEX) TABS, Take 1 tablet by mouth daily.  , Disp: , Rfl:  .  Cholecalciferol (VITAMIN D-3) 1000 UNITS CAPS, Take by mouth every morning., Disp: , Rfl:  .  losartan (COZAAR) 50 MG tablet, Take 50 mg by mouth daily., Disp: , Rfl:  .  metFORMIN (GLUCOPHAGE) 500 MG tablet, Take 1,000 mg by mouth 2 (two) times daily with a meal. 2-- 1000 mg  Tabs in AM and PM, Disp: , Rfl:  .  rosuvastatin (CRESTOR) 10 MG tablet, Take 10 mg by mouth once a week.  , Disp: , Rfl:   Current facility-administered medications:  .  Influenza vac split quadrivalent PF (FLUARIX) injection 0.5 mL, 0.5 mL, Intramuscular, Once, Volanda Napoleon, MD  Allergies: No Known Allergies  Past Medical History, Surgical history, Social history, and Family History were reviewed and updated.  Review of Systems: As above  Physical Exam:  height is 5\' 3"  (1.6 m) and weight is 175 lb (79.379 kg). Her oral temperature is 97.9 F (36.6 C). Her blood  pressure is 135/56 and her pulse is 64. Her respiration is 16.   Well-developed and well-nourished white female. Head and neck exam shows no adenopathy in the neck. There is no ocular or oral lesions. She has no palpable thyroid. Lungs are clear. Cardiac exam regular rate and rhythm with no murmurs rubs or bruits. Breast exam shows right breast no masses edema or erythema. There is no right axillary adenopathy. Left breast side effect from radiation surgery. His well-healed lumpectomy scar the 7:00 position. No obvious mass is noted in the left breast. There is no left axillary adenopathy. Abdomen is soft. She has good bowel sounds. There is no fluid wave. There is no palpable liver or spleen. Back exam no tenderness over the spine ribs or hips. Extremities shows no clubbing cyanosis or edema. She has good range of motion of her joints. Neurological exam no focal deficits.  Lab Results  Component Value Date   WBC 6.7 08/12/2015   HGB 14.5 08/12/2015   HCT 41.9 08/12/2015   MCV 89 08/12/2015   PLT 278 08/12/2015     Chemistry      Component Value Date/Time   NA 140 03/11/2015 1423   NA 143 02/11/2015 1100   K 3.6 03/11/2015 1423   K 4.0 02/11/2015 1100   CL 106 03/11/2015 1423  CL 108 02/11/2015 1100   CO2 22 03/11/2015 1423   CO2 28 02/11/2015 1100   BUN 11 03/11/2015 1423   BUN 11 02/11/2015 1100   CREATININE 0.73 03/11/2015 1423   CREATININE 0.8 02/11/2015 1100      Component Value Date/Time   CALCIUM 10.1 03/11/2015 1423   CALCIUM 11.5* 02/11/2015 1100   ALKPHOS 63 03/11/2015 1423   ALKPHOS 60 02/11/2015 1100   AST 22 03/11/2015 1423   AST 25 02/11/2015 1100   ALT 32 03/11/2015 1423   ALT 32 02/11/2015 1100   BILITOT 0.6 03/11/2015 1423   BILITOT 0.70 02/11/2015 1100         Impression and Plan: [name] is 80 year old white female. She has a stage I ductal carcinoma the left breast. Her tumor is ER positive. She had lumpectomy and radiation. She had 5 years of  Femara. She completed this in June of 2013.  I do not see any problems with recurrence. She now is out by 8 years.   I still want to see her back in 6 months  Volanda Napoleon, MD 10/13/20163:07 PM

## 2016-02-09 IMAGING — CR DG LUMBAR SPINE COMPLETE 4+V
5 series · 5 of 5 positions shown · non-contrast
Comparison: March 05, 2013

CLINICAL DATA: Chronic lumbago.  No appreciable radicular symptoms

EXAM:
LUMBAR SPINE - COMPLETE 4+ VIEW

[t l-spine a.p.]
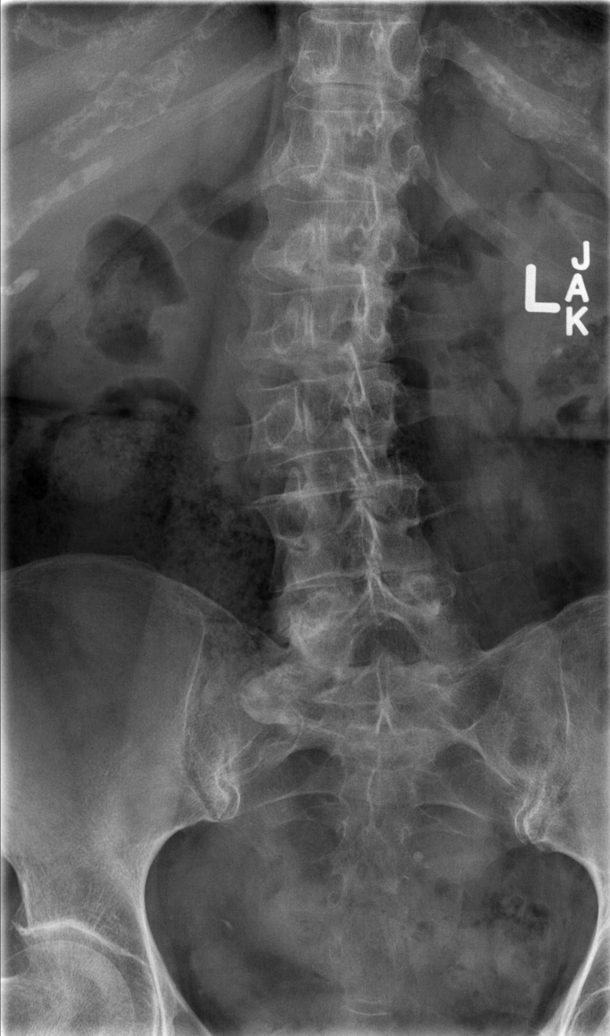

[t l-spine oblique exposure (1 of 2)]
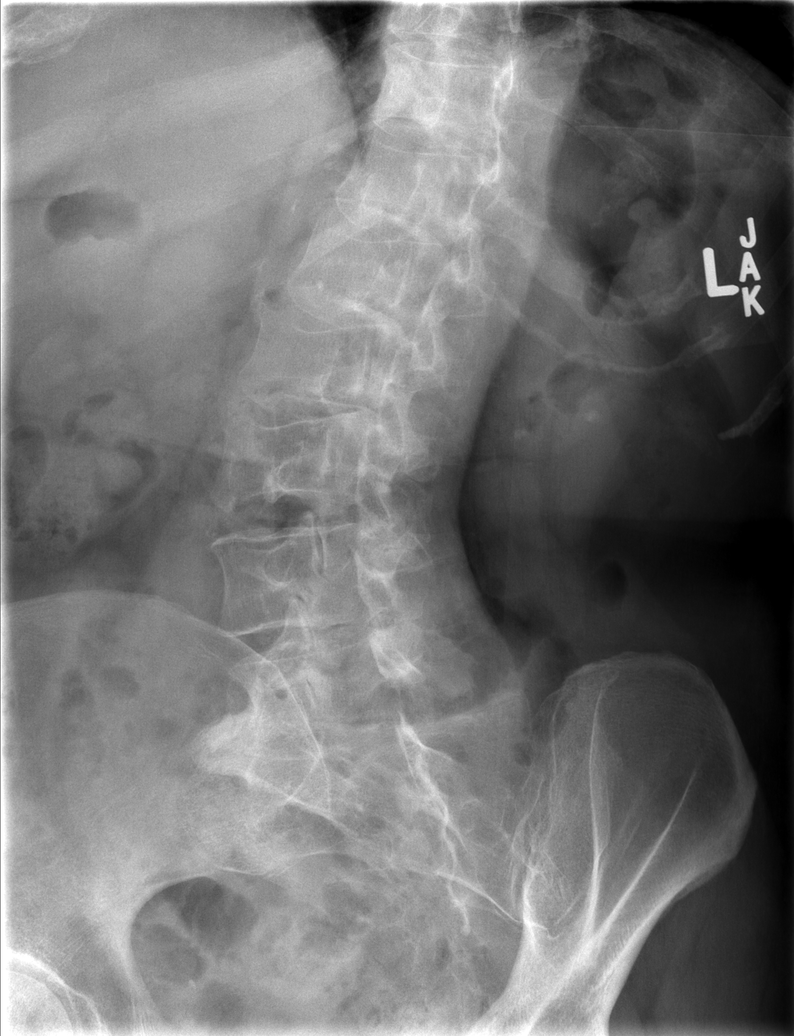

[t l-spine oblique exposure (2 of 2)]
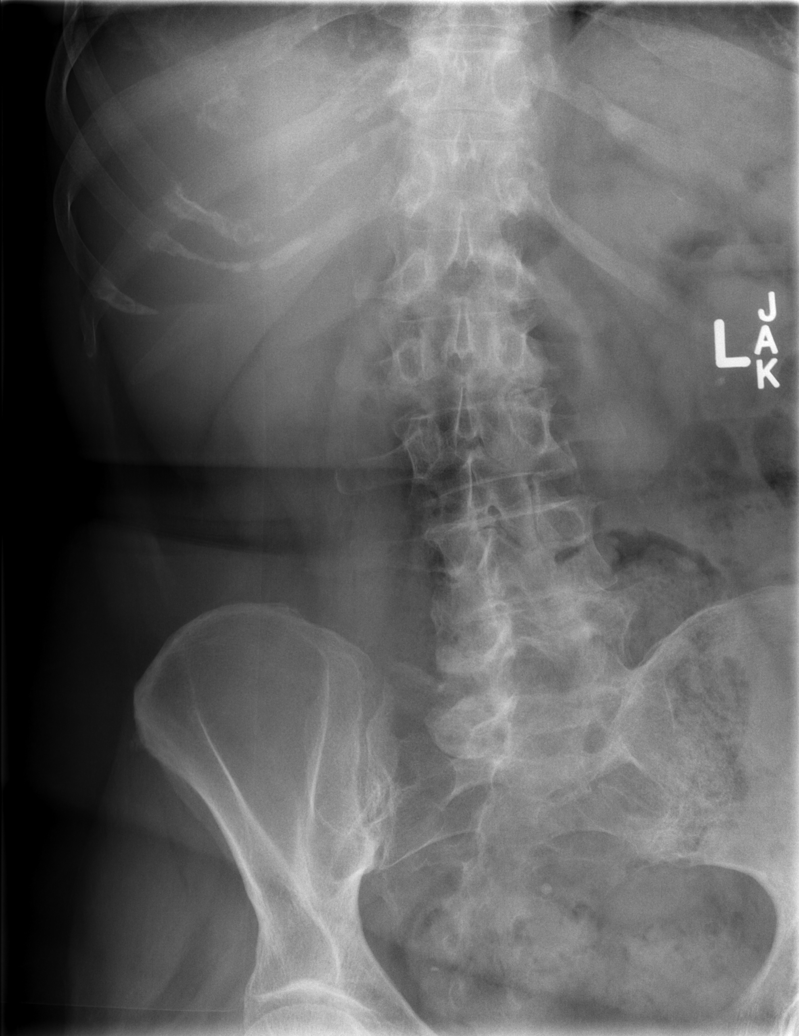

[t l-spine lat]
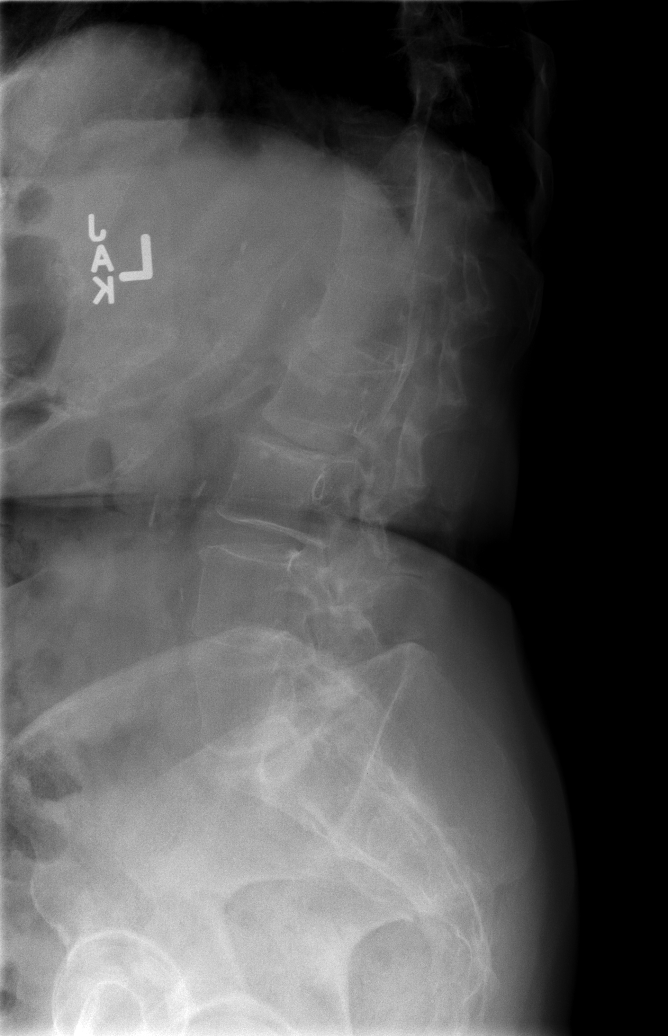

[t l-spine l5-s1 spot]
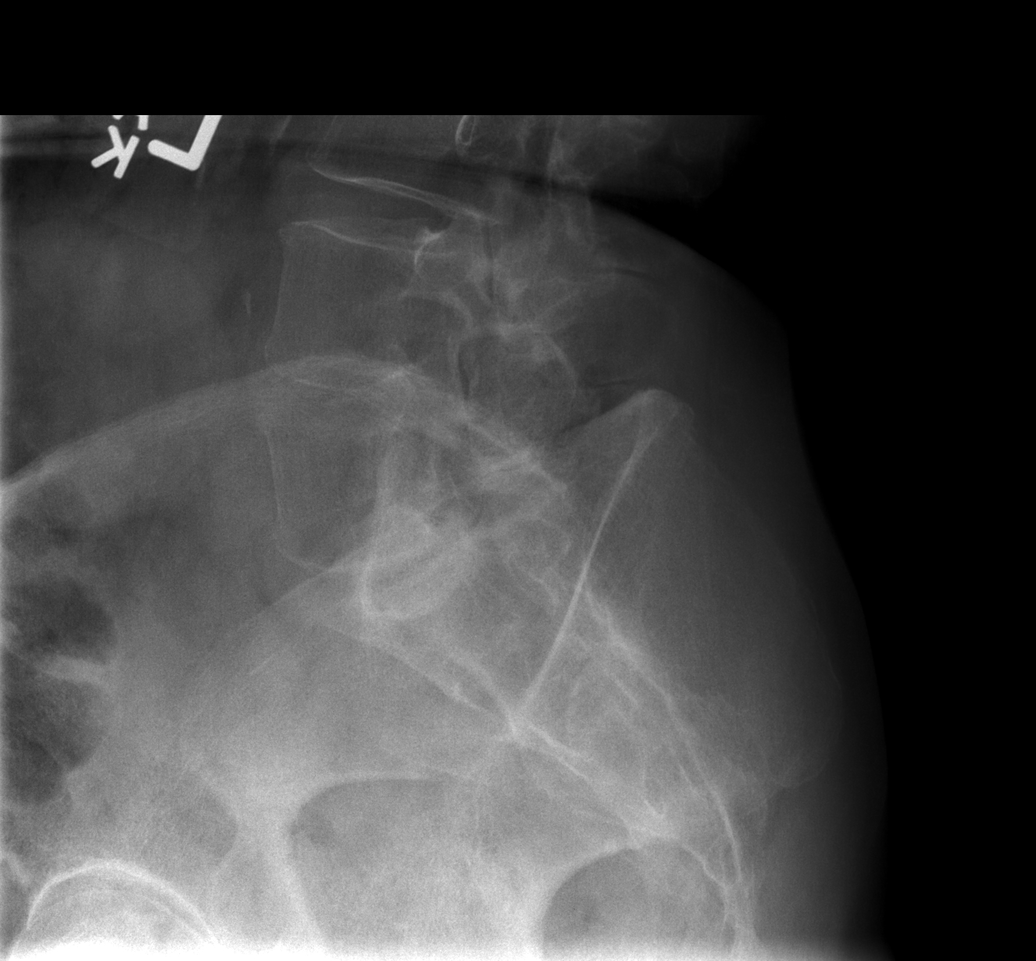

[5 of 5 positions shown; findings below may reference images not displayed]

FINDINGS: Frontal, lateral, spot lumbosacral lateral, and bilateral oblique
views were obtained. There are 5 non-rib-bearing lumbar type
vertebral bodies. There is lumbar dextroscoliosis with a rotatory
component. Mild superior endplate concavity at L2 is stable. There
is no new fracture. No spondylolisthesis. There is moderate disc
space narrowing at L4-5 and L5-S1. There is milder narrowing at
L1-2. There is facet osteoarthritic change at L4-5 and L5-S1
bilaterally. There are scattered foci of atherosclerotic
calcification.
IMPRESSION: Stable scoliosis. Mild superior endplate concavity at L2 is stable.
No new fracture. No spondylolisthesis. Stable areas of arthropathy.

## 2016-02-10 ENCOUNTER — Other Ambulatory Visit: Payer: Self-pay

## 2016-02-10 ENCOUNTER — Ambulatory Visit: Payer: Self-pay | Admitting: Hematology & Oncology

## 2016-02-17 ENCOUNTER — Ambulatory Visit (HOSPITAL_BASED_OUTPATIENT_CLINIC_OR_DEPARTMENT_OTHER): Payer: Medicare HMO | Admitting: Hematology & Oncology

## 2016-02-17 ENCOUNTER — Other Ambulatory Visit (HOSPITAL_BASED_OUTPATIENT_CLINIC_OR_DEPARTMENT_OTHER): Payer: Medicare HMO

## 2016-02-17 VITALS — BP 154/59 | HR 59 | Temp 97.8°F | Wt 168.0 lb

## 2016-02-17 DIAGNOSIS — Z853 Personal history of malignant neoplasm of breast: Secondary | ICD-10-CM

## 2016-02-17 DIAGNOSIS — Z85820 Personal history of malignant melanoma of skin: Secondary | ICD-10-CM

## 2016-02-17 DIAGNOSIS — M810 Age-related osteoporosis without current pathological fracture: Secondary | ICD-10-CM

## 2016-02-17 DIAGNOSIS — C50012 Malignant neoplasm of nipple and areola, left female breast: Secondary | ICD-10-CM

## 2016-02-17 DIAGNOSIS — C50911 Malignant neoplasm of unspecified site of right female breast: Secondary | ICD-10-CM

## 2016-02-17 DIAGNOSIS — Z170 Estrogen receptor positive status [ER+]: Principal | ICD-10-CM

## 2016-02-17 DIAGNOSIS — Z23 Encounter for immunization: Secondary | ICD-10-CM

## 2016-02-17 LAB — COMPREHENSIVE METABOLIC PANEL (CC13)
ALBUMIN: 4.2 g/dL (ref 3.5–4.8)
ALK PHOS: 90 IU/L (ref 39–117)
ALT: 28 IU/L (ref 0–32)
AST: 22 IU/L (ref 0–40)
Albumin/Globulin Ratio: 1.4 (ref 1.2–2.2)
BILIRUBIN TOTAL: 0.3 mg/dL (ref 0.0–1.2)
BUN / CREAT RATIO: 17 (ref 12–28)
BUN: 16 mg/dL (ref 8–27)
CHLORIDE: 106 mmol/L (ref 96–106)
CO2: 29 mmol/L (ref 18–29)
CREATININE: 0.94 mg/dL (ref 0.57–1.00)
Calcium, Ser: 11.5 mg/dL — ABNORMAL HIGH (ref 8.7–10.3)
GFR calc Af Amer: 67 mL/min/{1.73_m2} (ref >59)
GFR calc non Af Amer: 58 mL/min/{1.73_m2} — ABNORMAL LOW (ref >59)
GLUCOSE: 115 mg/dL — AB (ref 65–99)
Globulin, Total: 3.1 g/dL (ref 1.5–4.5)
Potassium, Ser: 4.7 mmol/L (ref 3.5–5.2)
Sodium: 143 mmol/L (ref 134–144)
Total Protein: 7.3 g/dL (ref 6.0–8.5)

## 2016-02-17 LAB — CBC WITH DIFFERENTIAL (CANCER CENTER ONLY)
BASO#: 0 10*3/uL (ref 0.0–0.2)
BASO%: 0.5 % (ref 0.0–2.0)
EOS%: 3 % (ref 0.0–7.0)
Eosinophils Absolute: 0.2 10*3/uL (ref 0.0–0.5)
HEMATOCRIT: 43.2 % (ref 34.8–46.6)
HGB: 15.2 g/dL (ref 11.6–15.9)
LYMPH#: 3.2 10*3/uL (ref 0.9–3.3)
LYMPH%: 41.7 % (ref 14.0–48.0)
MCH: 32.5 pg (ref 26.0–34.0)
MCHC: 35.2 g/dL (ref 32.0–36.0)
MCV: 93 fL (ref 81–101)
MONO#: 0.6 10*3/uL (ref 0.1–0.9)
MONO%: 8.3 % (ref 0.0–13.0)
NEUT%: 46.5 % (ref 39.6–80.0)
NEUTROS ABS: 3.6 10*3/uL (ref 1.5–6.5)
Platelets: 305 10*3/uL (ref 145–400)
RBC: 4.67 10*6/uL (ref 3.70–5.32)
RDW: 14.4 % (ref 11.1–15.7)
WBC: 7.7 10*3/uL (ref 3.9–10.0)

## 2016-02-17 NOTE — Progress Notes (Signed)
Hematology and Oncology Follow Up Visit  [name] [id number] [birth year] 81 y.o. 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 is back for followup. She actually has been pretty busy lately. She was found have a melanoma in the left upper arm. She had this excised by her dermatologist. I'll have to see if he cannot sit me down the pathology report.  Otherwise, she is doing okay. She had a good Christmas. She had a good winter time. She had recently gone down to [place] to see her husband's family.   She has had no problems with nausea or vomiting. She's had no cough.  Her blood sugars have been doing okay.  She has had no change in bowel or bladder habits. There has not been any swelling in her legs. She's had no rashes. She's had no fever. She's had no infections.  Overall, her performance status is ECOG 1.   Medications:  Current outpatient prescriptions:  .  aspirin 81 MG tablet, Take 81 mg by mouth daily.  , Disp: , Rfl:  .  B Complex-C (SUPER B COMPLEX) TABS, Take 1 tablet by mouth daily.  , Disp: , Rfl:  .  carboxymethylcellulose (REFRESH PLUS) 0.5 % SOLN, 1 drop 3 (three) times daily as needed., Disp: , Rfl:  .  Cholecalciferol (VITAMIN D-3) 1000 UNITS CAPS, Take by mouth every morning., Disp: , Rfl:  .  losartan (COZAAR) 50 MG tablet, Take 50 mg by mouth daily., Disp: , Rfl:  .  metFORMIN (GLUCOPHAGE) 500 MG tablet, Take 1,000 mg by mouth 2 (two) times daily with a meal. 2-- 1000 mg  Tabs in AM and PM, Disp: , Rfl:  .  Mometasone Furoate 100 MCG/ACT AERO, Place 0.1 % into both eyes every morning., Disp: , Rfl:  .  rosuvastatin (CRESTOR) 10 MG tablet, Take 10 mg by mouth once a week.  , Disp: , Rfl:  .  sodium chloride (MURO 128) 2 % ophthalmic solution, Place 1 drop into the left eye every 4 (four) hours as needed for  irritation., Disp: , Rfl:  .  valACYclovir (VALTREX) 500 MG tablet, Take 500 mg by mouth 2 (two) times daily., Disp: , Rfl:   Allergies: No Known Allergies  Past Medical History, Surgical history, Social history, and Family History were reviewed and updated.  Review of Systems: As above  Physical Exam:  weight is 168 lb (76.204 kg). Her temperature is 97.8 F (36.6 C). Her blood pressure is 154/59 and her pulse is 59.   Well-developed and well-nourished white female. Head and neck exam shows no adenopathy in the neck. There is no ocular or oral lesions. She has no palpable thyroid. Lungs are clear. Cardiac exam regular rate and rhythm with no murmurs rubs or bruits. Breast exam shows right breast no masses edema or erythema. There is no right axillary adenopathy. Left breast side effect from radiation surgery. His well-healed lumpectomy scar the 7:00 position. No obvious mass is noted in the left breast. There is no left axillary adenopathy. Abdomen is soft. She has good bowel sounds. There is no fluid wave. There is no palpable liver or spleen. Back exam no tenderness over the spine ribs or hips. Extremities shows no clubbing cyanosis or edema. She has a healing surgical scar in the left upper arm. She has  good range of motion of her joints. Neurological exam no focal deficits.  Lab Results  Component Value Date   WBC 7.7 02/17/2016   HGB 15.2 02/17/2016   HCT 43.2 02/17/2016   MCV 93 02/17/2016   PLT 305 02/17/2016     Chemistry      Component Value Date/Time   NA 143 02/17/2016 1525   NA 144 08/12/2015 1419   NA 140 03/11/2015 1423   NA 143 02/11/2015 1100   K 4.7 02/17/2016 1525   K 4.2 08/12/2015 1419   K 3.6 03/11/2015 1423   K 4.0 02/11/2015 1100   CL 106 02/17/2016 1525   CL 106 03/11/2015 1423   CL 108 02/11/2015 1100   CO2 29 02/17/2016 1525   CO2 29 08/12/2015 1419   CO2 22 03/11/2015 1423   CO2 28 02/11/2015 1100   BUN 16 02/17/2016 1525   BUN 11.8 08/12/2015  1419   BUN 11 03/11/2015 1423   BUN 11 02/11/2015 1100   CREATININE 0.94 02/17/2016 1525   CREATININE 0.8 08/12/2015 1419   CREATININE 0.73 03/11/2015 1423   CREATININE 0.8 02/11/2015 1100      Component Value Date/Time   CALCIUM 11.5* 02/17/2016 1525   CALCIUM 11.1* 08/12/2015 1419   CALCIUM 10.1 03/11/2015 1423   CALCIUM 11.5* 02/11/2015 1100   ALKPHOS 90 02/17/2016 1525   ALKPHOS 74 08/12/2015 1419   ALKPHOS 63 03/11/2015 1423   ALKPHOS 60 02/11/2015 1100   AST 22 02/17/2016 1525   AST 26 08/12/2015 1419   AST 22 03/11/2015 1423   AST 25 02/11/2015 1100   ALT 28 02/17/2016 1525   ALT 32 08/12/2015 1419   ALT 32 03/11/2015 1423   ALT 32 02/11/2015 1100   BILITOT 0.3 02/17/2016 1525   BILITOT 0.52 08/12/2015 1419   BILITOT 0.6 03/11/2015 1423   BILITOT 0.70 02/11/2015 1100         Impression and Plan: [name] is 81 year old white female. She has a stage I ductal carcinoma the left breast.She has surgery for this back in June 2007. Her tumor is ER positive. She had lumpectomy and radiation. She had 5 years of Femara. She completed this in June of 2013.  I do not see any problems with recurrence. She now is out by 10 years.  I really need to see the pathology report from this melanoma. I want to make sure that she does not need any type of wide local excision. She was seen by her dermatologist. I just want to make sure that we are not to "[place]" in taking care of this.  I think that we can probably get her back now yearly. She is okay with this.  Volanda Napoleon, MD 4/20/20175:57 PM

## 2016-02-18 LAB — VITAMIN D 25 HYDROXY (VIT D DEFICIENCY, FRACTURES): VIT D 25 HYDROXY: 44.5 ng/mL (ref 30.0–100.0)

## 2016-02-24 ENCOUNTER — Encounter: Payer: Self-pay | Admitting: Nurse Practitioner

## 2016-03-08 LAB — HEPATIC FUNCTION PANEL
ALT: 24 (ref 7–35)
AST: 21 (ref 13–35)
Alkaline Phosphatase: 77 (ref 25–125)

## 2016-03-08 LAB — VITAMIN D 25 HYDROXY (VIT D DEFICIENCY, FRACTURES): Vit D, 25-Hydroxy: 48

## 2016-03-08 LAB — COMPREHENSIVE METABOLIC PANEL: Calcium: 10.4 (ref 8.7–10.7)

## 2016-03-08 LAB — CBC AND DIFFERENTIAL
HCT: 45 (ref 36–46)
Hemoglobin: 15.5 (ref 12.0–16.0)
Platelets: 26 — AB (ref 150–399)
WBC: 7

## 2016-03-08 LAB — BASIC METABOLIC PANEL
BUN: 9 (ref 4–21)
Creatinine: 0.7 (ref <=1.1)
Glucose: 109
Potassium: 4 (ref 3.4–5.3)
Sodium: 139 (ref 137–147)

## 2016-03-08 LAB — LIPID PANEL
Cholesterol: 161 (ref 0–200)
HDL: 34 — AB (ref 35–70)
LDL Cholesterol: 93
Triglycerides: 170 — AB (ref 40–160)

## 2016-03-08 LAB — HEMOGLOBIN A1C: Hemoglobin A1C: 6.1

## 2016-09-12 LAB — HM MAMMOGRAPHY

## 2016-12-12 LAB — HEMOGLOBIN A1C: Hemoglobin A1C: 6.3

## 2016-12-12 LAB — MICROALBUMIN, URINE: Microalb, Ur: 12

## 2017-02-15 ENCOUNTER — Ambulatory Visit (HOSPITAL_BASED_OUTPATIENT_CLINIC_OR_DEPARTMENT_OTHER): Payer: Medicare HMO | Admitting: Hematology & Oncology

## 2017-02-15 ENCOUNTER — Other Ambulatory Visit (HOSPITAL_BASED_OUTPATIENT_CLINIC_OR_DEPARTMENT_OTHER): Payer: Medicare HMO

## 2017-02-15 VITALS — BP 134/56 | HR 65 | Temp 97.8°F | Resp 17 | Wt 169.0 lb

## 2017-02-15 DIAGNOSIS — Z853 Personal history of malignant neoplasm of breast: Secondary | ICD-10-CM

## 2017-02-15 DIAGNOSIS — Z170 Estrogen receptor positive status [ER+]: Principal | ICD-10-CM

## 2017-02-15 DIAGNOSIS — C50911 Malignant neoplasm of unspecified site of right female breast: Secondary | ICD-10-CM

## 2017-02-15 DIAGNOSIS — C50012 Malignant neoplasm of nipple and areola, left female breast: Principal | ICD-10-CM | POA: Insufficient documentation

## 2017-02-15 DIAGNOSIS — C50011 Malignant neoplasm of nipple and areola, right female breast: Secondary | ICD-10-CM

## 2017-02-15 DIAGNOSIS — Z85820 Personal history of malignant melanoma of skin: Secondary | ICD-10-CM | POA: Diagnosis not present

## 2017-02-15 DIAGNOSIS — M810 Age-related osteoporosis without current pathological fracture: Secondary | ICD-10-CM

## 2017-02-15 LAB — CMP (CANCER CENTER ONLY)
ALBUMIN: 3.9 g/dL (ref 3.3–5.5)
ALK PHOS: 76 U/L (ref 26–84)
ALT(SGPT): 39 U/L (ref 10–47)
AST: 32 U/L (ref 11–38)
BILIRUBIN TOTAL: 0.9 mg/dL (ref 0.20–1.60)
BUN, Bld: 13 mg/dL (ref 7–22)
CALCIUM: 10.2 mg/dL (ref 8.0–10.3)
CO2: 28 mEq/L (ref 18–33)
Chloride: 104 mEq/L (ref 98–108)
Creat: 0.8 mg/dl (ref 0.6–1.2)
Glucose, Bld: 142 mg/dL — ABNORMAL HIGH (ref 73–118)
POTASSIUM: 4.4 meq/L (ref 3.3–4.7)
Sodium: 145 mEq/L (ref 128–145)
TOTAL PROTEIN: 7.5 g/dL (ref 6.4–8.1)

## 2017-02-15 LAB — CBC WITH DIFFERENTIAL (CANCER CENTER ONLY)
BASO#: 0.1 10*3/uL (ref 0.0–0.2)
BASO%: 0.6 % (ref 0.0–2.0)
EOS ABS: 0.2 10*3/uL (ref 0.0–0.5)
EOS%: 2.9 % (ref 0.0–7.0)
HEMATOCRIT: 46.2 % (ref 34.8–46.6)
HEMOGLOBIN: 16.1 g/dL — AB (ref 11.6–15.9)
LYMPH#: 2.9 10*3/uL (ref 0.9–3.3)
LYMPH%: 36 % (ref 14.0–48.0)
MCH: 32.2 pg (ref 26.0–34.0)
MCHC: 34.8 g/dL (ref 32.0–36.0)
MCV: 92 fL (ref 81–101)
MONO#: 0.7 10*3/uL (ref 0.1–0.9)
MONO%: 8 % (ref 0.0–13.0)
NEUT#: 4.3 10*3/uL (ref 1.5–6.5)
NEUT%: 52.5 % (ref 39.6–80.0)
Platelets: 252 10*3/uL (ref 145–400)
RBC: 5 10*6/uL (ref 3.70–5.32)
RDW: 13.2 % (ref 11.1–15.7)
WBC: 8.2 10*3/uL (ref 3.9–10.0)

## 2017-02-15 NOTE — Progress Notes (Signed)
Hematology and Oncology Follow Up Visit  [name] [id number] [birth year] 82 y.o. 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 is back for followup. She actually has been pretty busy lately. She was found have a melanoma in the left upper arm. She had this excised by her dermatologist.   She has had no problems with nausea or vomiting. She's had no cough.  Her blood sugars have been doing okay.  She has had no change in bowel or bladder habits. There has not been any swelling in her legs. She's had no rashes. She's had no fever. She's had no infections.  Overall, her performance status is ECOG 1.   Medications:  Current Outpatient Prescriptions:  .  aspirin 81 MG tablet, Take 81 mg by mouth daily.  , Disp: , Rfl:  .  B Complex-C (SUPER B COMPLEX) TABS, Take 1 tablet by mouth daily.  , Disp: , Rfl:  .  carboxymethylcellulose (REFRESH PLUS) 0.5 % SOLN, 1 drop 3 (three) times daily as needed., Disp: , Rfl:  .  Cholecalciferol (VITAMIN D-3) 1000 UNITS CAPS, Take by mouth every morning., Disp: , Rfl:  .  losartan (COZAAR) 50 MG tablet, Take 50 mg by mouth daily., Disp: , Rfl:  .  metFORMIN (GLUCOPHAGE) 500 MG tablet, Take 1,000 mg by mouth 2 (two) times daily with a meal. 2-- 1000 mg  Tabs in AM and PM, Disp: , Rfl:  .  Mometasone Furoate 100 MCG/ACT AERO, Place 0.1 % into both eyes every morning., Disp: , Rfl:  .  rosuvastatin (CRESTOR) 10 MG tablet, Take 10 mg by mouth once a week.  , Disp: , Rfl:  .  sodium chloride (MURO 128) 2 % ophthalmic solution, Place 1 drop into the left eye every 4 (four) hours as needed for irritation., Disp: , Rfl:  .  valACYclovir (VALTREX) 500 MG tablet, Take 500 mg by mouth 2 (two) times daily., Disp: , Rfl:   Allergies: No Known Allergies  Past Medical History, Surgical history, Social history, and Family  History were reviewed and updated.  Review of Systems: As above  Physical Exam:  weight is 169 lb (76.7 kg). Her oral temperature is 97.8 F (36.6 C). Her blood pressure is 134/56 (abnormal) and her pulse is 65. Her respiration is 17 and oxygen saturation is 97%.   Well-developed and well-nourished white female. Head and neck exam shows no adenopathy in the neck. There is no ocular or oral lesions. She has no palpable thyroid. Lungs are clear. Cardiac exam regular rate and rhythm with no murmurs rubs or bruits. Breast exam shows right breast no masses edema or erythema. There is no right axillary adenopathy. Left breast side effect from radiation surgery. His well-healed lumpectomy scar the 7:00 position. No obvious mass is noted in the left breast. There is no left axillary adenopathy. Abdomen is soft. She has good bowel sounds. There is no fluid wave. There is no palpable liver or spleen. Back exam no tenderness over the spine ribs or hips. Extremities shows no clubbing cyanosis or edema. She has a healing surgical scar in the left upper arm. She has good range of motion of her joints. Neurological exam no focal deficits.  Lab Results  Component Value Date   WBC 8.2 02/15/2017   HGB 16.1 (H) 02/15/2017  HCT 46.2 02/15/2017   MCV 92 02/15/2017   PLT 252 02/15/2017     Chemistry      Component Value Date/Time   NA 145 02/15/2017 1333   NA 144 08/12/2015 1419   K 4.4 02/15/2017 1333   K 4.2 08/12/2015 1419   CL 104 02/15/2017 1333   CO2 28 02/15/2017 1333   CO2 29 08/12/2015 1419   BUN 13 02/15/2017 1333   BUN 11.8 08/12/2015 1419   CREATININE 0.8 02/15/2017 1333   CREATININE 0.8 08/12/2015 1419      Component Value Date/Time   CALCIUM 10.2 02/15/2017 1333   CALCIUM 11.1 (H) 08/12/2015 1419   ALKPHOS 76 02/15/2017 1333   ALKPHOS 74 08/12/2015 1419   AST 32 02/15/2017 1333   AST 26 08/12/2015 1419   ALT 39 02/15/2017 1333   ALT 32 08/12/2015 1419   BILITOT 0.90 02/15/2017  1333   BILITOT 0.52 08/12/2015 1419         Impression and Plan: [name] is 82 year old white female. She has a stage I ductal carcinoma the left breast.She has surgery for this back in June 2007. Her tumor is ER positive. She had lumpectomy and radiation. She had 5 years of Femara. She completed this in June of 2013.  I do not see any problems with recurrence. She now is out by 11 years.  She is followed closely by dermatology. The melanoma thankfully should not be a problem.   I know that she will have a wonderful 82th birthday. I'm sure family will have a celebration for her.    Volanda Napoleon, MD 4/19/20182:53 PM

## 2017-02-16 LAB — VITAMIN D 25 HYDROXY (VIT D DEFICIENCY, FRACTURES): Vitamin D, 25-Hydroxy: 33.3 ng/mL (ref 30.0–100.0)

## 2017-03-12 LAB — TSH: TSH: 2.93 (ref <=5.90)

## 2017-03-12 LAB — BASIC METABOLIC PANEL
BUN: 11 (ref 4–21)
Creatinine: 0.8 (ref <=1.1)
Potassium: 4.2 (ref 3.4–5.3)
Sodium: 141 (ref 137–147)

## 2017-03-12 LAB — HEPATIC FUNCTION PANEL
ALT: 26 (ref 7–35)
AST: 23 (ref 13–35)
Alkaline Phosphatase: 65 (ref 25–125)

## 2017-03-12 LAB — LIPID PANEL
Cholesterol: 160 (ref 0–200)
HDL: 33 — AB (ref 35–70)
LDL Cholesterol: 99
Triglycerides: 181 — AB (ref 40–160)

## 2017-03-12 LAB — COMPREHENSIVE METABOLIC PANEL: Calcium: 10.2 (ref 8.7–10.7)

## 2017-03-12 LAB — FECAL OCCULT BLOOD, GUAIAC: Fecal Occult Blood: NEGATIVE

## 2017-03-12 LAB — HM DIABETES FOOT EXAM: HM Diabetic Foot Exam: NORMAL

## 2017-06-11 LAB — HEMOGLOBIN A1C: Hemoglobin A1C: 6.2

## 2017-07-19 LAB — HM DIABETES EYE EXAM

## 2017-09-17 LAB — HEMOGLOBIN A1C: Hemoglobin A1C: 6.7

## 2017-10-02 ENCOUNTER — Encounter (HOSPITAL_COMMUNITY): Payer: Self-pay | Admitting: *Deleted

## 2017-10-02 ENCOUNTER — Encounter (HOSPITAL_COMMUNITY): Payer: Self-pay | Admitting: Family Medicine

## 2017-10-02 ENCOUNTER — Ambulatory Visit (HOSPITAL_COMMUNITY)
Admission: EM | Admit: 2017-10-02 | Discharge: 2017-10-02 | Disposition: A | Discharge status: Home / Self Care | Payer: Medicare HMO | Source: Home / Self Care

## 2017-10-02 ENCOUNTER — Emergency Department (HOSPITAL_COMMUNITY): Payer: Medicare HMO

## 2017-10-02 ENCOUNTER — Other Ambulatory Visit: Payer: Self-pay

## 2017-10-02 ENCOUNTER — Emergency Department (HOSPITAL_COMMUNITY)
Admission: EM | Admit: 2017-10-02 | Discharge: 2017-10-03 | Disposition: A | Discharge status: Home / Self Care | Payer: Medicare HMO | Attending: Emergency Medicine | Admitting: Emergency Medicine

## 2017-10-02 DIAGNOSIS — Z7982 Long term (current) use of aspirin: Secondary | ICD-10-CM | POA: Insufficient documentation

## 2017-10-02 DIAGNOSIS — R55 Syncope and collapse: Secondary | ICD-10-CM | POA: Diagnosis present

## 2017-10-02 DIAGNOSIS — I10 Essential (primary) hypertension: Secondary | ICD-10-CM | POA: Diagnosis not present

## 2017-10-02 DIAGNOSIS — N390 Urinary tract infection, site not specified: Secondary | ICD-10-CM | POA: Diagnosis not present

## 2017-10-02 DIAGNOSIS — Z7984 Long term (current) use of oral hypoglycemic drugs: Secondary | ICD-10-CM | POA: Diagnosis not present

## 2017-10-02 DIAGNOSIS — E119 Type 2 diabetes mellitus without complications: Secondary | ICD-10-CM | POA: Diagnosis not present

## 2017-10-02 DIAGNOSIS — R479 Unspecified speech disturbances: Secondary | ICD-10-CM

## 2017-10-02 DIAGNOSIS — Z87891 Personal history of nicotine dependence: Secondary | ICD-10-CM | POA: Insufficient documentation

## 2017-10-02 DIAGNOSIS — Z79899 Other long term (current) drug therapy: Secondary | ICD-10-CM | POA: Diagnosis not present

## 2017-10-02 DIAGNOSIS — R42 Dizziness and giddiness: Secondary | ICD-10-CM | POA: Diagnosis not present

## 2017-10-02 HISTORY — DX: Essential (primary) hypertension: I10

## 2017-10-02 HISTORY — DX: Type 2 diabetes mellitus without complications: E11.9

## 2017-10-02 LAB — I-STAT CHEM 8, ED
BUN: 12 mg/dL (ref 6–20)
CREATININE: 0.8 mg/dL (ref 0.44–1.00)
Calcium, Ion: 1.31 mmol/L (ref 1.15–1.40)
Chloride: 105 mmol/L (ref 101–111)
Glucose, Bld: 165 mg/dL — ABNORMAL HIGH (ref 65–99)
HEMATOCRIT: 39 % (ref 36.0–46.0)
HEMOGLOBIN: 13.3 g/dL (ref 12.0–15.0)
POTASSIUM: 3.8 mmol/L (ref 3.5–5.1)
SODIUM: 139 mmol/L (ref 135–145)
TCO2: 21 mmol/L — ABNORMAL LOW (ref 22–32)

## 2017-10-02 LAB — COMPREHENSIVE METABOLIC PANEL
ALT: 22 U/L (ref 14–54)
AST: 25 U/L (ref 15–41)
Albumin: 3.5 g/dL (ref 3.5–5.0)
Alkaline Phosphatase: 73 U/L (ref 38–126)
Anion gap: 9 (ref 5–15)
BUN: 11 mg/dL (ref 6–20)
CHLORIDE: 106 mmol/L (ref 101–111)
CO2: 22 mmol/L (ref 22–32)
Calcium: 9.9 mg/dL (ref 8.9–10.3)
Creatinine, Ser: 0.9 mg/dL (ref 0.44–1.00)
GFR calc Af Amer: >60 mL/min (ref >60)
GFR calc non Af Amer: 59 mL/min — ABNORMAL LOW (ref >60)
GLUCOSE: 163 mg/dL — AB (ref 65–99)
POTASSIUM: 3.8 mmol/L (ref 3.5–5.1)
SODIUM: 137 mmol/L (ref 135–145)
Total Bilirubin: 0.5 mg/dL (ref 0.3–1.2)
Total Protein: 6.5 g/dL (ref 6.5–8.1)

## 2017-10-02 LAB — CBC
HEMATOCRIT: 39.9 % (ref 36.0–46.0)
HEMOGLOBIN: 13.7 g/dL (ref 12.0–15.0)
MCH: 30.2 pg (ref 26.0–34.0)
MCHC: 34.3 g/dL (ref 30.0–36.0)
MCV: 88.1 fL (ref 78.0–100.0)
Platelets: 250 10*3/uL (ref 150–400)
RBC: 4.53 MIL/uL (ref 3.87–5.11)
RDW: 13.3 % (ref 11.5–15.5)
WBC: 8.5 10*3/uL (ref 4.0–10.5)

## 2017-10-02 LAB — DIFFERENTIAL
Basophils Absolute: 0 10*3/uL (ref 0.0–0.1)
Basophils Relative: 0 %
EOS PCT: 2 %
Eosinophils Absolute: 0.2 10*3/uL (ref 0.0–0.7)
LYMPHS ABS: 3.4 10*3/uL (ref 0.7–4.0)
LYMPHS PCT: 40 %
MONO ABS: 0.5 10*3/uL (ref 0.1–1.0)
MONOS PCT: 6 %
NEUTROS ABS: 4.4 10*3/uL (ref 1.7–7.7)
Neutrophils Relative %: 52 %

## 2017-10-02 LAB — I-STAT TROPONIN, ED: TROPONIN I, POC: 0 ng/mL (ref 0.00–0.08)

## 2017-10-02 LAB — APTT: aPTT: 31 seconds (ref 24–36)

## 2017-10-02 LAB — PROTIME-INR
INR: 1
Prothrombin Time: 13.1 seconds (ref 11.4–15.2)

## 2017-10-02 NOTE — ED Triage Notes (Signed)
Pt reports being out to eat around 1530-1600, began feeling "not normal" states she felt unsteady, weak and had episodes of difficulty speaking. Denies any unilateral weakness during that time. Pt went to [facility], states she felt lightheaded and near syncopal on the way there. They noted bp was 96/54 at [facility]. Pt reports all symptoms have resolved and now feels back to baseline other than generalized fatigue. No neuro deficits are noted at triage.

## 2017-10-02 NOTE — ED Provider Notes (Signed)
10/02/2017 6:23 PM   DOB: 03-26-1935 / MRN: [id number]  SUBJECTIVE:  [name] is a 82 y.o. female with a history of diabetes and HTN presenting for weakness. Tells me that she was eating dinner tonight and suddenly began to feel weak and suddenly lost the ability to speak coherantly.  States that this lasted about 5-10 minutes and the husband corroborates this. She was unable to ambulate normally but made it to the car with the help of her husband.  She did begin to feel better upon driving here.  No history of CVA or MI.  She walks daily.   Husband was with her when the event started.    She has No Known Allergies.   She  has a past medical history of Diabetes mellitus without complication ([facility]) and Hypertension.    She  reports that she quit smoking about 48 years ago. Her smoking use included cigarettes. She started smoking about 62 years ago. She has a 15.00 pack-year smoking history. she has never used smokeless tobacco. She  has no sexual activity history on file. The patient  has no past surgical history on file.  Her family history is not on file.  Review of Systems  Constitutional: Negative for chills and fever.  Cardiovascular: Negative for chest pain.  Gastrointestinal: Negative for nausea.  Skin: Negative for itching and rash.  Neurological: Positive for dizziness and weakness.    OBJECTIVE:  BP (!) 96/54   Pulse (!) 57   Temp 97.7 F (36.5 C) (Oral)   Resp 18   SpO2 97%   Physical Exam  Constitutional: She is oriented to person, place, and time. She is active.  Non-toxic appearance.  Eyes: EOM are normal. Pupils are equal, round, and reactive to light.  Cardiovascular: Normal rate, regular rhythm, S1 normal, S2 normal, normal heart sounds and intact distal pulses. Exam reveals no gallop, no friction rub and no decreased pulses.  No murmur heard. Pulmonary/Chest: Effort normal. No stridor. No tachypnea. No respiratory distress. She has no wheezes. She has no rales.   Abdominal: She exhibits no distension.  Musculoskeletal: She exhibits no edema.  Neurological: She is alert and oriented to person, place, and time. She has normal strength and normal reflexes. She is not disoriented. She displays no atrophy, no tremor and normal reflexes. No cranial nerve deficit or sensory deficit. She exhibits normal muscle tone. She displays no seizure activity. Coordination and gait normal. GCS eye subscore is 4. GCS verbal subscore is 5. GCS motor subscore is 6.  Skin: Skin is warm and dry. She is not diaphoretic. No pallor.  Psychiatric: Her behavior is normal.    No results found for this or any previous visit (from the past 72 hour(s)).  No results found.  ASSESSMENT AND PLAN:  The encounter diagnosis was Speech disturbance, unspecified type.  It sounds as if she had a TIA.  She has a history of well controlled diabetes and hypertenion.  She takes ASA, lisinopril and crestor one time weekly. I worry that she had a TIA and a larger event is loaming.  I have advised that she present to the ED for further evaluation and a CT scan.    The patient is advised to call or return to clinic if she does not see an improvement in symptoms, or to seek the care of the closest emergency department if she worsens with the above plan.   Philis Fendt, MHS, PA-C 10/02/2017 6:23 PM    Carlis Abbott,  Audrie Lia, PA-C 10/02/17 1825

## 2017-10-02 NOTE — ED Notes (Signed)
Per Philis Fendt, to send pt to ER. Pt driven down to ER with husband.

## 2017-10-02 NOTE — ED Triage Notes (Signed)
Pt here for weakness and feeling like she may pass out. Reports this all started while eating dinner tonight. BP 96/54 in triage and pt reports low for her. Pt is a diabatic and took her medication while eating around 3 pm which she reports is early for her. Denies recent illness or pain anywhere.

## 2017-10-02 NOTE — Discharge Instructions (Signed)
Go directly to the ED

## 2017-10-03 LAB — URINALYSIS, ROUTINE W REFLEX MICROSCOPIC
BILIRUBIN URINE: NEGATIVE
Glucose, UA: NEGATIVE mg/dL
Ketones, ur: NEGATIVE mg/dL
NITRITE: POSITIVE — AB
PH: 6 (ref 5.0–8.0)
Protein, ur: 100 mg/dL — AB
SPECIFIC GRAVITY, URINE: 1.008 (ref 1.005–1.030)
Squamous Epithelial / LPF: NONE SEEN

## 2017-10-03 MED ORDER — LIDOCAINE HCL (PF) 1 % IJ SOLN
INTRAMUSCULAR | Status: AC
  Filled 2017-10-03: qty 5

## 2017-10-03 MED ORDER — CEFTRIAXONE SODIUM 1 G IJ SOLR: 1.0000 g | Freq: Once | INTRAMUSCULAR | Status: DC

## 2017-10-03 MED ORDER — CEFTRIAXONE SODIUM 1 G IJ SOLR
1.0000 g | Freq: Once | INTRAMUSCULAR | Status: AC
  Administered 2017-10-03: 1 g via INTRAMUSCULAR
  Filled 2017-10-03: qty 10

## 2017-10-03 MED ORDER — CEPHALEXIN 500 MG PO CAPS: 500.0000 mg | ORAL_CAPSULE | Freq: Two times a day (BID) | ORAL | 0 refills | Status: DC

## 2017-10-03 NOTE — ED Provider Notes (Signed)
[facility] Provider Note   CSN: [id number] Arrival date & time: 10/02/17  1830     History   Chief Complaint Chief Complaint  Patient presents with  . Weakness  . Near Syncope    HPI [name] is a 82 y.o. female.  Patient presents to the ER for evaluation of feeling ill.  Patient reports that around dinnertime she started to feel "not like herself".  She cannot describe what she was feeling.  She reports that she was not experiencing any pain, there was no dizziness, she was not having difficulty breathing.  She just felt unsteady.  She went to urgent care first and had a near syncopal episode with hypotension, was sent to the ER.  Patient now feeling much improved, back to her normal baseline.      Past Medical History:  Diagnosis Date  . Diabetes mellitus without complication ([place])   . Hypertension     Patient Active Problem List   Diagnosis Date Noted  . 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 No pertinent surgical history.  OB History    No data available       Home Medications    Prior to Admission medications   Medication Sig Start Date End Date Taking? Authorizing Provider  aspirin 81 MG tablet Take 81 mg by mouth daily.     Yes [provider]  B Complex-C (SUPER B COMPLEX) TABS Take 1 tablet by mouth daily.     Yes [provider]  Cholecalciferol (VITAMIN D-3) 1000 UNITS CAPS Take 1,000 Units by mouth 2 (two) times daily.    Yes [provider]  losartan (COZAAR) 50 MG tablet Take 50 mg by mouth daily.   Yes [provider]  metFORMIN (GLUCOPHAGE) 500 MG tablet Take 1,000 mg by mouth 2 (two) times daily with a meal. 2-- 1000 mg  Tabs in AM and PM   Yes [provider]  Polyethyl Glycol-Propyl Glycol (SYSTANE OP) Place 1 drop into both eyes daily as needed (dry eyes).   Yes  [provider]  rosuvastatin (CRESTOR) 10 MG tablet Take 0.5 mg by mouth 2 (two) times a week. Monday and Friday   Yes [provider]  sodium chloride (MURO 128) 2 % ophthalmic solution Place 1 drop into the left eye daily.    Yes [provider]  cephALEXin (KEFLEX) 500 MG capsule Take 1 capsule (500 mg total) by mouth 2 (two) times daily. 10/03/17   Orpah Greek, MD    Family History History reviewed. No pertinent family history.  Social History Social History   Tobacco Use  . Smoking status: Former Smoker    Packs/day: 1.00    Years: 15.00    Pack years: 15.00    Types: Cigarettes    Start date: 09/14/1955    Last attempt to quit: 07/14/1969    Years since quitting: 48.2  . Smokeless tobacco: Never Used  . Tobacco comment: quit 40 years ago  Substance Use Topics  . Alcohol use: Not on file  . Drug use: Not on file     Allergies   Patient has no known allergies.   Review of Systems Review of Systems  Respiratory: Negative for shortness of breath.   Cardiovascular: Negative for chest pain.  Neurological: Positive for light-headedness.  All other systems reviewed and are negative.  Physical Exam Updated Vital Signs BP (!) 146/67   Pulse (!) 56   Temp 97.7 F (36.5 C) (Oral)   Resp 16   Ht 5\' 2"  (1.575 m)   Wt 72.1 kg (159 lb)   SpO2 98%   BMI 29.08 kg/m   Physical Exam  Constitutional: She is oriented to person, place, and time. She appears well-developed and well-nourished. No distress.  HENT:  Head: Normocephalic and atraumatic.  Right Ear: Hearing normal.  Left Ear: Hearing normal.  Nose: Nose normal.  Mouth/Throat: Oropharynx is clear and moist and mucous membranes are normal.  Eyes: Conjunctivae and EOM are normal. Pupils are equal, round, and reactive to light.  Neck: Normal range of motion. Neck supple.  Cardiovascular: Regular rhythm, S1 normal and S2 normal. Exam reveals no gallop and no friction rub.    No murmur heard. Pulmonary/Chest: Effort normal and breath sounds normal. No respiratory distress. She exhibits no tenderness.  Abdominal: Soft. Normal appearance and bowel sounds are normal. There is no hepatosplenomegaly. There is no tenderness. There is no rebound, no guarding, no tenderness at McBurney's point and negative Murphy's sign. No hernia.  Musculoskeletal: Normal range of motion.  Neurological: She is alert and oriented to person, place, and time. She has normal strength. No cranial nerve deficit or sensory deficit. Coordination normal. GCS eye subscore is 4. GCS verbal subscore is 5. GCS motor subscore is 6.  Skin: Skin is warm, dry and intact. No rash noted. No cyanosis.  Psychiatric: She has a normal mood and affect. Her speech is normal and behavior is normal. Thought content normal.  Nursing note and vitals reviewed.    ED Treatments / Results  Labs (all labs ordered are listed, but only abnormal results are displayed) Labs Reviewed  COMPREHENSIVE METABOLIC PANEL - Abnormal; Notable for the following components:      Result Value   Glucose, Bld 163 (*)    GFR calc non Af Amer 59 (*)    All other components within normal limits  URINALYSIS, ROUTINE W REFLEX MICROSCOPIC - Abnormal; Notable for the following components:   APPearance TURBID (*)    Hgb urine dipstick LARGE (*)    Protein, ur 100 (*)    Nitrite POSITIVE (*)    Leukocytes, UA LARGE (*)    Bacteria, UA MANY (*)    Non Squamous Epithelial 0-5 (*)    All other components within normal limits  I-STAT CHEM 8, ED - Abnormal; Notable for the following components:   Glucose, Bld 165 (*)    TCO2 21 (*)    All other components within normal limits  URINE CULTURE  PROTIME-INR  APTT  CBC  DIFFERENTIAL  I-STAT TROPONIN, ED  CBG MONITORING, ED    EKG  EKG Interpretation None       Radiology Ct Head Wo Contrast  Result Date: 10/02/2017 CLINICAL DATA:  Slurred speech EXAM: CT HEAD WITHOUT CONTRAST  TECHNIQUE: Contiguous axial images were obtained from the base of the skull through the vertex without intravenous contrast. COMPARISON:  None. FINDINGS: Brain: Mild atrophic changes are noted. No findings to suggest acute hemorrhage, acute infarction or space-occupying mass lesion are noted. Vascular: No hyperdense vessel or unexpected calcification. Skull: Normal. Negative for fracture or focal lesion. Sinuses/Orbits: No acute finding. Other: None. IMPRESSION: Mild atrophic changes without acute abnormality. Electronically Signed   By: Inez Catalina M.D.   On: 10/02/2017 20:03    Procedures Procedures (including critical care time)  Medications Ordered in  ED Medications  cefTRIAXone (ROCEPHIN) injection 1 g (not administered)     Initial Impression / Assessment and Plan / ED Course  I have reviewed the triage vital signs and the nursing notes.  Pertinent labs & imaging results that were available during my care of the patient were reviewed by me and considered in my medical decision making (see chart for details).     Patient presented to the ER for "not feeling like herself".  She could not describe the symptoms.  She does, however, report that she is feeling much better currently.  Her workup shows evidence of obvious urinary tract infection which is felt to be the cause of her symptoms.  She does not appear to be septic currently, no fever, tachycardia, hypotension.  She did have a low blood pressure noted at urgent care earlier, 96/54, but by the time she arrived here in the emergency department her blood pressure was normal and has been normal to hypertensive throughout the evaluation.  The remainder of her workup including blood work, head CT were unremarkable.  Patient felt stable for discharge, administered Rocephin here in the ER and will continue Keflex at home, follow-up with PCP.  Urine culture ordered and pending.  Final Clinical Impressions(s) / ED Diagnoses   Final diagnoses:    Urinary tract infection without hematuria, site unspecified    ED Discharge Orders        Ordered    cephALEXin (KEFLEX) 500 MG capsule  2 times daily     10/03/17 0151       Orpah Greek, MD 10/03/17 [phone]

## 2017-10-03 NOTE — ED Notes (Addendum)
Pt verbalizes understanding of d/c instructions. Pt received prescriptions. Pt taken to lobby in wheelchair at d/c with all belongings.   

## 2017-10-04 LAB — URINE CULTURE

## 2017-12-10 LAB — MICROALBUMIN, URINE: Microalb, Ur: 292

## 2017-12-11 LAB — BASIC METABOLIC PANEL: Glucose: 158

## 2018-01-28 ENCOUNTER — Ambulatory Visit
Admission: RE | Admit: 2018-01-28 | Discharge: 2018-01-28 | Disposition: A | Discharge status: Home / Self Care | Payer: Medicare HMO | Source: Ambulatory Visit | Attending: Internal Medicine | Admitting: Internal Medicine

## 2018-01-28 ENCOUNTER — Other Ambulatory Visit: Payer: Self-pay | Admitting: Internal Medicine

## 2018-01-28 DIAGNOSIS — R0781 Pleurodynia: Secondary | ICD-10-CM

## 2018-02-14 ENCOUNTER — Inpatient Hospital Stay: Payer: Medicare HMO | Admitting: Hematology & Oncology

## 2018-02-14 ENCOUNTER — Inpatient Hospital Stay: Payer: Medicare HMO | Attending: Hematology & Oncology

## 2018-03-12 LAB — BASIC METABOLIC PANEL
BUN: 11 (ref 4–21)
Creatinine: 0.8 (ref <=1.1)
Glucose: 144
Potassium: 4.5 (ref 3.4–5.3)
Sodium: 140 (ref 137–147)

## 2018-03-12 LAB — LIPID PANEL
Cholesterol: 165 (ref 0–200)
HDL: 32 — AB (ref 35–70)
LDL Cholesterol: 99
Triglycerides: 169 — AB (ref 40–160)

## 2018-03-12 LAB — CBC AND DIFFERENTIAL
HCT: 45 (ref 36–46)
Hemoglobin: 15.6 (ref 12.0–16.0)
Platelets: 294 (ref 150–399)
WBC: 8.1

## 2018-03-12 LAB — TSH: TSH: 4.36 (ref <=5.90)

## 2018-03-20 LAB — HM MAMMOGRAPHY

## 2018-06-10 LAB — HEMOGLOBIN A1C: Hemoglobin A1C: 7.3

## 2018-08-01 LAB — HM DIABETES EYE EXAM

## 2018-09-18 LAB — HEMOGLOBIN A1C: Hemoglobin A1C: 8.3

## 2018-09-18 LAB — BASIC METABOLIC PANEL: Glucose: 197

## 2018-12-11 DIAGNOSIS — D485 Neoplasm of uncertain behavior of skin: Secondary | ICD-10-CM | POA: Diagnosis not present

## 2018-12-11 DIAGNOSIS — H6502 Acute serous otitis media, left ear: Secondary | ICD-10-CM | POA: Diagnosis not present

## 2018-12-11 DIAGNOSIS — J209 Acute bronchitis, unspecified: Secondary | ICD-10-CM | POA: Diagnosis not present

## 2018-12-18 DIAGNOSIS — E1165 Type 2 diabetes mellitus with hyperglycemia: Secondary | ICD-10-CM | POA: Diagnosis not present

## 2018-12-24 DIAGNOSIS — E1165 Type 2 diabetes mellitus with hyperglycemia: Secondary | ICD-10-CM | POA: Diagnosis not present

## 2018-12-24 LAB — HEMOGLOBIN A1C: Hemoglobin A1C: 9.3

## 2019-01-14 ENCOUNTER — Other Ambulatory Visit: Payer: Self-pay | Admitting: *Deleted

## 2019-01-14 DIAGNOSIS — I10 Essential (primary) hypertension: Secondary | ICD-10-CM | POA: Insufficient documentation

## 2019-01-14 DIAGNOSIS — E1159 Type 2 diabetes mellitus with other circulatory complications: Secondary | ICD-10-CM | POA: Insufficient documentation

## 2019-01-14 DIAGNOSIS — E119 Type 2 diabetes mellitus without complications: Secondary | ICD-10-CM

## 2019-01-14 NOTE — Patient Outreach (Signed)
  [facility]) Care Management Chronic Special Needs Program  01/14/2019  Name: [name] DOB: August 01, 1935  MRN: [id number]  [name] is enrolled in a chronic special needs plan for  Diabetes. A completed health risk assessment has not been received from the client and client has not responded to outreach attempts by their health care concierge.  The client's individualized care plan was developed based on available data.  Plan:  . Send unsuccessful outreach letter with a copy of individualized care plan to client . Send Neurosurgeon on HTN . Send Advance Directive packet . Send individualized care plan to provider  Chronic care management coordinator, [place] Plan,  will attempt outreach in 2-4 motnhs.   Barrington Ellison RN,CCM,CDE [place] Management Coordinator Office Phone [phone] Office Fax [phone]

## 2019-01-28 DIAGNOSIS — E1165 Type 2 diabetes mellitus with hyperglycemia: Secondary | ICD-10-CM | POA: Diagnosis not present

## 2019-02-25 DIAGNOSIS — L719 Rosacea, unspecified: Secondary | ICD-10-CM | POA: Diagnosis not present

## 2019-02-25 DIAGNOSIS — L570 Actinic keratosis: Secondary | ICD-10-CM | POA: Diagnosis not present

## 2019-02-25 DIAGNOSIS — Z85828 Personal history of other malignant neoplasm of skin: Secondary | ICD-10-CM | POA: Diagnosis not present

## 2019-03-17 DIAGNOSIS — Z1331 Encounter for screening for depression: Secondary | ICD-10-CM | POA: Diagnosis not present

## 2019-03-17 DIAGNOSIS — Z1211 Encounter for screening for malignant neoplasm of colon: Secondary | ICD-10-CM | POA: Diagnosis not present

## 2019-03-17 DIAGNOSIS — R5383 Other fatigue: Secondary | ICD-10-CM | POA: Diagnosis not present

## 2019-03-17 DIAGNOSIS — I70219 Atherosclerosis of native arteries of extremities with intermittent claudication, unspecified extremity: Secondary | ICD-10-CM | POA: Diagnosis not present

## 2019-03-17 DIAGNOSIS — R1032 Left lower quadrant pain: Secondary | ICD-10-CM | POA: Diagnosis not present

## 2019-03-17 DIAGNOSIS — E1165 Type 2 diabetes mellitus with hyperglycemia: Secondary | ICD-10-CM | POA: Diagnosis not present

## 2019-03-17 DIAGNOSIS — E785 Hyperlipidemia, unspecified: Secondary | ICD-10-CM | POA: Diagnosis not present

## 2019-03-17 DIAGNOSIS — Z1389 Encounter for screening for other disorder: Secondary | ICD-10-CM | POA: Diagnosis not present

## 2019-03-17 DIAGNOSIS — R002 Palpitations: Secondary | ICD-10-CM | POA: Diagnosis not present

## 2019-03-17 DIAGNOSIS — R42 Dizziness and giddiness: Secondary | ICD-10-CM | POA: Diagnosis not present

## 2019-03-17 DIAGNOSIS — E039 Hypothyroidism, unspecified: Secondary | ICD-10-CM | POA: Diagnosis not present

## 2019-03-17 DIAGNOSIS — Z6830 Body mass index (BMI) 30.0-30.9, adult: Secondary | ICD-10-CM | POA: Diagnosis not present

## 2019-03-17 DIAGNOSIS — Z79899 Other long term (current) drug therapy: Secondary | ICD-10-CM | POA: Diagnosis not present

## 2019-03-17 DIAGNOSIS — Z0000 Encounter for general adult medical examination without abnormal findings: Secondary | ICD-10-CM | POA: Diagnosis not present

## 2019-03-17 DIAGNOSIS — H9113 Presbycusis, bilateral: Secondary | ICD-10-CM | POA: Diagnosis not present

## 2019-03-17 DIAGNOSIS — Z131 Encounter for screening for diabetes mellitus: Secondary | ICD-10-CM | POA: Diagnosis not present

## 2019-03-17 DIAGNOSIS — D485 Neoplasm of uncertain behavior of skin: Secondary | ICD-10-CM | POA: Diagnosis not present

## 2019-03-17 DIAGNOSIS — E7800 Pure hypercholesterolemia, unspecified: Secondary | ICD-10-CM | POA: Diagnosis not present

## 2019-03-17 LAB — COMPREHENSIVE METABOLIC PANEL: Calcium: 11.8 — AB (ref 8.7–10.7)

## 2019-03-17 LAB — CBC AND DIFFERENTIAL
HCT: 15 — AB (ref 36–46)
Hemoglobin: 15 (ref 12.0–16.0)
Platelets: 317 (ref 150–399)
WBC: 6.9

## 2019-03-17 LAB — TSH: TSH: 4 (ref <=5.90)

## 2019-03-17 LAB — HEMOGLOBIN A1C: Hemoglobin A1C: 7.2

## 2019-03-17 LAB — HEPATIC FUNCTION PANEL
ALT: 27 (ref 7–35)
AST: 24 (ref 13–35)
Alkaline Phosphatase: 61 (ref 25–125)

## 2019-03-20 DIAGNOSIS — E1165 Type 2 diabetes mellitus with hyperglycemia: Secondary | ICD-10-CM | POA: Diagnosis not present

## 2019-03-26 DIAGNOSIS — Z1231 Encounter for screening mammogram for malignant neoplasm of breast: Secondary | ICD-10-CM | POA: Diagnosis not present

## 2019-03-26 DIAGNOSIS — Z853 Personal history of malignant neoplasm of breast: Secondary | ICD-10-CM | POA: Diagnosis not present

## 2019-03-26 LAB — HM MAMMOGRAPHY

## 2019-04-07 DIAGNOSIS — N3000 Acute cystitis without hematuria: Secondary | ICD-10-CM | POA: Diagnosis not present

## 2019-04-15 DIAGNOSIS — N3000 Acute cystitis without hematuria: Secondary | ICD-10-CM | POA: Diagnosis not present

## 2019-04-22 DIAGNOSIS — N3000 Acute cystitis without hematuria: Secondary | ICD-10-CM | POA: Diagnosis not present

## 2019-05-07 ENCOUNTER — Other Ambulatory Visit: Payer: Self-pay

## 2019-05-07 NOTE — Patient Outreach (Signed)
[facility]) Care Management Chronic Special Needs Program  05/07/2019  Name: [name] DOB: June 19, 1935  MRN: [id number]  [name] is enrolled in a chronic special needs plan for Diabetes. Chronic Care Management Coordinator telephoned client to review health risk assessment and to develop individualized care plan.  Introduced the chronic care management program, importance of client participation, and taking their care plan to all provider appointments and inpatient facilities.  Reviewed the transition of care process and possible referral to community care management.  Subjective: Client states that her last Hemoglobin A1C was 8.2%.  States her doctor would like it to be lower and she has gotten it down for 9.8% last year.  States she has not been told she needs to check her blood sugar and she does not have a glucometer. States she tries to walk 5 times a week on her property for 20 minutes.  States she tries to watch her portions on breads and potatoes but she does get take out often.  States she has been maintaining her weight at 159 lb for the last few years and that is her doctors goal for her.  Goals Addressed            This Visit's Progress   . Client understands the importance of follow-up with providers by attending scheduled visits   On track   . Client will verbalize knowledge of self management of Hypertension as evidences by BP reading of 140/90 or less; or as defined by provider   On track   . HEMOGLOBIN A1C < 7.0       [employer] approved meters-One Touch, Freestyle or Precision Diabetes self management actions:  Glucose monitoring per provider recommendations  Eat Healthy  Check feet daily  Visit provider every 3-6 months as directed  Hbg A1C level every 3-6 months.  Eye Exam yearly    . Maintain timely refills of diabetic medication as prescribed within the year .   On track   . Obtain annual  Lipid Profile, LDL-C   On  track   . Obtain Annual Eye (retinal)  Exam    On track   . Obtain Annual Foot Exam   On track   . Obtain annual screen for micro albuminuria (urine) , nephropathy (kidney problems)   On track   . Obtain Hemoglobin A1C at least 2 times per year   On track   . Visit Primary Care Provider or Endocrinologist at least 2 times per year    On track    Client is not meeting diabetes self management goal of hemoglobin A1C of <7% with last reading reported by client of 8.2% Discussed benefits of checking her CBGs and how it can help her meet her diabetes self management goal.  Reviewed [employer]) approved meters One Touch, Freestyle or Precision and to discuss with her provider to get Rx sent to her pharmacy. Reviewed CHO portion sizes and to make wise choices when getting take out food. Reviewed number for 24 hour nurse Line Discussed COVID19 cause, symptoms, precautions (social distancing, stay at home order, hand washing), confirmed client knows how to contact provider  Plan:  Send successful outreach letter with a copy of their individualized care plan, Send individual care plan to provider and Send educational material-Basic CHO counting, EMMI: Diabetes: How to check your blood sugar  Chronic care management coordination will outreach in:  4-6 Months     Peter Garter RN, [place], CDE  Chronic Care Management Coordinator [place] Management [phone]

## 2019-05-12 DIAGNOSIS — N3000 Acute cystitis without hematuria: Secondary | ICD-10-CM | POA: Diagnosis not present

## 2019-05-13 DIAGNOSIS — N3001 Acute cystitis with hematuria: Secondary | ICD-10-CM | POA: Diagnosis not present

## 2019-05-19 DIAGNOSIS — N3000 Acute cystitis without hematuria: Secondary | ICD-10-CM | POA: Diagnosis not present

## 2019-06-03 ENCOUNTER — Other Ambulatory Visit: Payer: Self-pay | Admitting: Internal Medicine

## 2019-06-03 DIAGNOSIS — N3000 Acute cystitis without hematuria: Secondary | ICD-10-CM | POA: Diagnosis not present

## 2019-06-03 DIAGNOSIS — N3090 Cystitis, unspecified without hematuria: Secondary | ICD-10-CM

## 2019-06-04 DIAGNOSIS — M9903 Segmental and somatic dysfunction of lumbar region: Secondary | ICD-10-CM | POA: Diagnosis not present

## 2019-06-04 DIAGNOSIS — S338XXA Sprain of other parts of lumbar spine and pelvis, initial encounter: Secondary | ICD-10-CM | POA: Diagnosis not present

## 2019-06-04 DIAGNOSIS — M9902 Segmental and somatic dysfunction of thoracic region: Secondary | ICD-10-CM | POA: Diagnosis not present

## 2019-06-04 DIAGNOSIS — S134XXA Sprain of ligaments of cervical spine, initial encounter: Secondary | ICD-10-CM | POA: Diagnosis not present

## 2019-06-04 DIAGNOSIS — M9901 Segmental and somatic dysfunction of cervical region: Secondary | ICD-10-CM | POA: Diagnosis not present

## 2019-06-04 DIAGNOSIS — S233XXA Sprain of ligaments of thoracic spine, initial encounter: Secondary | ICD-10-CM | POA: Diagnosis not present

## 2019-06-09 DIAGNOSIS — M9903 Segmental and somatic dysfunction of lumbar region: Secondary | ICD-10-CM | POA: Diagnosis not present

## 2019-06-09 DIAGNOSIS — M9901 Segmental and somatic dysfunction of cervical region: Secondary | ICD-10-CM | POA: Diagnosis not present

## 2019-06-09 DIAGNOSIS — S233XXA Sprain of ligaments of thoracic spine, initial encounter: Secondary | ICD-10-CM | POA: Diagnosis not present

## 2019-06-09 DIAGNOSIS — S338XXA Sprain of other parts of lumbar spine and pelvis, initial encounter: Secondary | ICD-10-CM | POA: Diagnosis not present

## 2019-06-09 DIAGNOSIS — M9902 Segmental and somatic dysfunction of thoracic region: Secondary | ICD-10-CM | POA: Diagnosis not present

## 2019-06-09 DIAGNOSIS — S134XXA Sprain of ligaments of cervical spine, initial encounter: Secondary | ICD-10-CM | POA: Diagnosis not present

## 2019-06-11 ENCOUNTER — Ambulatory Visit
Admission: RE | Admit: 2019-06-11 | Discharge: 2019-06-11 | Disposition: A | Discharge status: Home / Self Care | Payer: HMO | Source: Ambulatory Visit | Attending: Internal Medicine | Admitting: Internal Medicine

## 2019-06-11 DIAGNOSIS — N200 Calculus of kidney: Secondary | ICD-10-CM | POA: Diagnosis not present

## 2019-06-11 DIAGNOSIS — N3090 Cystitis, unspecified without hematuria: Secondary | ICD-10-CM

## 2019-06-12 DIAGNOSIS — N3000 Acute cystitis without hematuria: Secondary | ICD-10-CM | POA: Diagnosis not present

## 2019-06-17 DIAGNOSIS — E1165 Type 2 diabetes mellitus with hyperglycemia: Secondary | ICD-10-CM | POA: Diagnosis not present

## 2019-06-18 DIAGNOSIS — E1165 Type 2 diabetes mellitus with hyperglycemia: Secondary | ICD-10-CM | POA: Diagnosis not present

## 2019-06-18 DIAGNOSIS — N3001 Acute cystitis with hematuria: Secondary | ICD-10-CM | POA: Diagnosis not present

## 2019-06-19 DIAGNOSIS — E1165 Type 2 diabetes mellitus with hyperglycemia: Secondary | ICD-10-CM | POA: Diagnosis not present

## 2019-07-01 DIAGNOSIS — S134XXA Sprain of ligaments of cervical spine, initial encounter: Secondary | ICD-10-CM | POA: Diagnosis not present

## 2019-07-01 DIAGNOSIS — M9903 Segmental and somatic dysfunction of lumbar region: Secondary | ICD-10-CM | POA: Diagnosis not present

## 2019-07-01 DIAGNOSIS — M9902 Segmental and somatic dysfunction of thoracic region: Secondary | ICD-10-CM | POA: Diagnosis not present

## 2019-07-01 DIAGNOSIS — S233XXA Sprain of ligaments of thoracic spine, initial encounter: Secondary | ICD-10-CM | POA: Diagnosis not present

## 2019-07-01 DIAGNOSIS — M9901 Segmental and somatic dysfunction of cervical region: Secondary | ICD-10-CM | POA: Diagnosis not present

## 2019-07-01 DIAGNOSIS — S338XXA Sprain of other parts of lumbar spine and pelvis, initial encounter: Secondary | ICD-10-CM | POA: Diagnosis not present

## 2019-07-15 ENCOUNTER — Ambulatory Visit (INDEPENDENT_AMBULATORY_CARE_PROVIDER_SITE_OTHER): Payer: HMO | Admitting: Family Medicine

## 2019-07-15 ENCOUNTER — Other Ambulatory Visit: Payer: Self-pay

## 2019-07-15 ENCOUNTER — Encounter: Payer: Self-pay | Admitting: Family Medicine

## 2019-07-15 VITALS — BP 143/85 | HR 73 | Temp 98.2°F | Ht 61.0 in | Wt 163.0 lb

## 2019-07-15 DIAGNOSIS — C50012 Malignant neoplasm of nipple and areola, left female breast: Secondary | ICD-10-CM

## 2019-07-15 DIAGNOSIS — I10 Essential (primary) hypertension: Secondary | ICD-10-CM

## 2019-07-15 DIAGNOSIS — K219 Gastro-esophageal reflux disease without esophagitis: Secondary | ICD-10-CM

## 2019-07-15 DIAGNOSIS — E785 Hyperlipidemia, unspecified: Secondary | ICD-10-CM | POA: Diagnosis not present

## 2019-07-15 DIAGNOSIS — C50011 Malignant neoplasm of nipple and areola, right female breast: Secondary | ICD-10-CM | POA: Diagnosis not present

## 2019-07-15 DIAGNOSIS — E119 Type 2 diabetes mellitus without complications: Secondary | ICD-10-CM | POA: Diagnosis not present

## 2019-07-15 DIAGNOSIS — E559 Vitamin D deficiency, unspecified: Secondary | ICD-10-CM | POA: Insufficient documentation

## 2019-07-15 DIAGNOSIS — Z170 Estrogen receptor positive status [ER+]: Secondary | ICD-10-CM

## 2019-07-15 DIAGNOSIS — E1169 Type 2 diabetes mellitus with other specified complication: Secondary | ICD-10-CM | POA: Insufficient documentation

## 2019-07-15 LAB — MICROALBUMIN, URINE
A1c: >300
Albumin: 150
Creat: 10

## 2019-07-15 LAB — HEMOGLOBIN A1C: Hgb A1c MFr Bld: 6.3 — AB (ref 4.0–6.0)

## 2019-07-15 NOTE — Progress Notes (Signed)
New Patient Office Visit  Subjective:  Patient ID: [name], female    DOB: June 08, 1935  Age: 84 y.o. MRN: [id number]  CC:  Chief Complaint  Patient presents with  . New Patient (Initial Visit)  . Diabetes    HPI Audriana Signor Kawamoto presents for  Left breast CA-2007-lumpectomy and radiation-mammogram caught CA-DCIS-[facility]  Mammography [place] HTN-Cozaar-no side effects-long term medication- stable bp DM-metformin-BID, amaryl 2mg  daily -stable-Dr. Mancel Bale q 3 months-pt does not check glucose at home Kidney stone-us renal-UTI-no pain Hyperlipidemia-Crestor 3x week-1/2 pill   Past Medical History:  Diagnosis Date  . Cancer ([place])   . Diabetes mellitus without complication ([place])   . Hypertension     Past Surgical History:  Procedure Laterality Date  . BREAST SURGERY       Social History  [employer]ing company-retired  Recently re-married -spouse died 2001(children's father) 2011/02/12 remarried-spouse died February 11, 2013, remarried Feb 11, 2017 4 children-2 in [place], [place], 1 in [place] Socioeconomic History  . Marital status: Married    Spouse name: Not on file  . Number of children: Not on file  . Years of education: Not on file  . Highest education level: Not on file  Occupational History  . Occupation: retired  Scientific laboratory technician  . Financial resource strain: Not on file  . Food insecurity    Worry: Never true    Inability: Never true  . Transportation needs    Medical: No    Non-medical: No  Tobacco Use  . Smoking status: Former Smoker    Packs/day: 1.00    Years: 15.00    Pack years: 15.00    Types: Cigarettes    Start date: 09/14/1955    Quit date: 07/14/1969    Years since quitting: 50.0  . Smokeless tobacco: Never Used  . Tobacco comment: quit 40 years ago  Substance and Sexual Activity  . Alcohol use: Never    Alcohol/week: 0.0 standard drinks    Frequency: Never  . Drug use: Never  . Sexual activity: Not Currently  Lifestyle  . Physical activity    Days  per week: Not on file    Minutes per session: Not on file  . Stress: Not on file  Relationships  . Social Herbalist on phone: Not on file    Gets together: Not on file    Attends religious service: Not on file    Active member of club or organization: Not on file    Attends meetings of clubs or organizations: Not on file    Relationship status: Not on file  . Intimate partner violence    Fear of current or ex partner: Not on file    Emotionally abused: Not on file    Physically abused: Not on file    Forced sexual activity: Not on file  Other Topics Concern  . Not on file  Social History Narrative  . Not on file    ROS Review of Systems  Constitutional: Negative.   HENT: Negative.   Respiratory: Negative.   Cardiovascular: Negative.   Gastrointestinal:       GERD  Endocrine:       Vit D deficiency  Genitourinary: Negative.   Allergic/Immunologic: Negative.   Neurological: Negative.   Hematological: Negative.   Psychiatric/Behavioral: Negative.     Objective:   Today's Vitals: BP (!) 143/85 (BP Location: Left Arm, Patient Position: Sitting, Cuff Size: Normal)   Pulse 73   Temp 98.2 F (36.8 C) (Oral)  Ht 5\' 1"  (1.549 m)   Wt 163 lb (73.9 kg)   SpO2 96%   BMI 30.80 kg/m   Physical Exam Constitutional:      Appearance: Normal appearance.  HENT:     Head: Normocephalic and atraumatic.  Neck:     Musculoskeletal: Neck supple. No muscular tenderness.  Cardiovascular:     Rate and Rhythm: Normal rate and regular rhythm.     Pulses: Normal pulses.     Heart sounds: Normal heart sounds.  Pulmonary:     Effort: Pulmonary effort is normal.     Breath sounds: Normal breath sounds.  Lymphadenopathy:     Cervical: No cervical adenopathy.  Skin:    General: Skin is warm and dry.  Neurological:     General: No focal deficit present.     Mental Status: She is alert.  Psychiatric:        Mood and Affect: Mood normal.     Assessment & Plan:     Outpatient Encounter Medications as of 07/15/2019  Medication Sig  . omeprazole (PRILOSEC) 20 MG capsule Take 20 mg by mouth daily.  Marland Kitchen aspirin 81 MG tablet Take 81 mg by mouth daily.    . B Complex-C (SUPER B COMPLEX) TABS Take 1 tablet by mouth daily.    . Cholecalciferol (VITAMIN D-3) 1000 UNITS CAPS Take 1,000 Units by mouth 2 (two) times daily.   Marland Kitchen glimepiride (AMARYL) 2 MG tablet TK 1 T PO QD  . losartan (COZAAR) 50 MG tablet Take 50 mg by mouth daily.  . metFORMIN (GLUCOPHAGE) 500 MG tablet Take 1,000 mg by mouth 2 (two) times daily with a meal. 2-- 1000 mg  Tabs in AM and PM  . Polyethyl Glycol-Propyl Glycol (SYSTANE OP) Place 1 drop into both eyes daily as needed (dry eyes).  . rosuvastatin (CRESTOR) 10 MG tablet Take 0.5 mg by mouth 2 (two) times a week. Monday and Friday  . sodium chloride (MURO 128) 2 % ophthalmic solution Place 1 drop into the left eye daily.   . [DISCONTINUED] cephALEXin (KEFLEX) 500 MG capsule Take 1 capsule (500 mg total) by mouth 2 (two) times daily. (Patient not taking: Reported on 05/07/2019)   No facility-administered encounter medications on file as of 07/15/2019.    1. Essential hypertension Cozaar-stable - Microalbumin, urine - Hemoglobin A1c  2. Diabetes mellitus without complication (HCC) Amaryl, metformin 6.3% today - Microalbumin, urine - Hemoglobin A1c Dr. Katy Fitch eye exam Foot exam completed 3. Vitamin D deficiency Vit D-no recent DEXA DEXA 4. Hyperlipidemia, unspecified hyperlipidemia type crestor  5. Gastroesophageal reflux disease without esophagitis omeprazole   LISA Hannah Beat, MD

## 2019-07-15 NOTE — Patient Instructions (Addendum)
DEXA-bone density  Fasting labwork at [facility]  Continue taking Amaryl and Metformin  Diabetes Basics  Diabetes (diabetes mellitus) is a long-term (chronic) disease. It occurs when the body does not properly use sugar (glucose) that is released from food after you eat. Diabetes may be caused by one or both of these problems:  Your pancreas does not make enough of a hormone called insulin.  Your body does not react in a normal way to insulin that it makes. Insulin lets sugars (glucose) go into cells in your body. This gives you energy. If you have diabetes, sugars cannot get into cells. This causes high blood sugar (hyperglycemia). Follow these instructions at home: How is diabetes treated? You may need to take insulin or other diabetes medicines daily to keep your blood sugar in balance. Take your diabetes medicines every day as told by your doctor. List your diabetes medicines here: Diabetes medicines  Name of medicine: ___metformin twice a day___________________________ ? Amount (dose): _______1000mg ________ Time (a.m./p.m.): _______________ Notes: ___________________________________  Name of medicine: _____amaryl _________________________ ? Amount (dose): ________2mg _______ Time (a.m./p.m.): _______________ Notes: ___________________________________  Name of medicine: ______________________________ ? Amount (dose): _______________ Time (a.m./p.m.): _______________ Notes: ___________________________________ If you use insulin, you will learn how to give yourself insulin by injection. You may need to adjust the amount based on the food that you eat. List the types of insulin you use here: Insulin  Insulin type: ______________________________ ? Amount (dose): _______________ Time (a.m./p.m.): _______________ Notes: ___________________________________  Insulin type: ______________________________ ? Amount (dose): _______________ Time (a.m./p.m.): _______________ Notes:  ___________________________________  Insulin type: ______________________________ ? Amount (dose): _______________ Time (a.m./p.m.): _______________ Notes: ___________________________________  Insulin type: ______________________________ ? Amount (dose): _______________ Time (a.m./p.m.): _______________ Notes: ___________________________________  Insulin type: ______________________________ ? Amount (dose): _______________ Time (a.m./p.m.): _______________ Notes: ___________________________________ How do I manage my blood sugar?  Check your blood sugar levels using a blood glucose monitor as directed by your doctor. Your doctor will set treatment goals for you. Generally, you should have these blood sugar levels:  Before meals (preprandial): 80-130 mg/dL (4.4-7.2 mmol/L).  After meals (postprandial): below 180 mg/dL (10 mmol/L).  A1c level: less than 7%. Write down the times that you will check your blood sugar levels: Blood sugar checks  Time: _______________ Notes: ___________________________________  Time: _______________ Notes: ___________________________________  Time: _______________ Notes: ___________________________________  Time: _______________ Notes: ___________________________________  Time: _______________ Notes: ___________________________________  Time: _______________ Notes: ___________________________________  What do I need to know about low blood sugar? Low blood sugar is called hypoglycemia. This is when blood sugar is at or below 70 mg/dL (3.9 mmol/L). Symptoms may include:  Feeling: ? Hungry. ? Worried or nervous (anxious). ? Sweaty and clammy. ? Confused. ? Dizzy. ? Sleepy. ? Sick to your stomach (nauseous).  Having: ? A fast heartbeat. ? A headache. ? A change in your vision. ? Tingling or no feeling (numbness) around the mouth, lips, or tongue. ? Jerky movements that you cannot control (seizure).  Having trouble with: ? Moving  (coordination). ? Sleeping. ? Passing out (fainting). ? Getting upset easily (irritability). Treating low blood sugar To treat low blood sugar, eat or drink something sugary right away. If you can think clearly and swallow safely, follow the 15:15 rule:  Take 15 grams of a fast-acting carb (carbohydrate). Talk with your doctor about how much you should take.  Some fast-acting carbs are: ? Sugar tablets (glucose pills). Take 3-4 glucose pills. ? 6-8 pieces of hard candy. ? 4-6 oz (120-150 mL) of fruit juice. ?  4-6 oz (120-150 mL) of regular (not diet) soda. ? 1 Tbsp (15 mL) honey or sugar.  Check your blood sugar 15 minutes after you take the carb.  If your blood sugar is still at or below 70 mg/dL (3.9 mmol/L), take 15 grams of a carb again.  If your blood sugar does not go above 70 mg/dL (3.9 mmol/L) after 3 tries, get help right away.  After your blood sugar goes back to normal, eat a meal or a snack within 1 hour. Treating very low blood sugar If your blood sugar is at or below 54 mg/dL (3 mmol/L), you have very low blood sugar (severe hypoglycemia). This is an emergency. Do not wait to see if the symptoms will go away. Get medical help right away. Call your local emergency services (911 in the [place] Do not drive yourself to the hospital. Questions to ask your health care provider  Do I need to meet with a diabetes educator?  What equipment will I need to care for myself at home?  What diabetes medicines do I need? When should I take them?  How often do I need to check my blood sugar?  What number can I call if I have questions?  When is my next doctor's visit?  Where can I find a support group for people with diabetes? Where to find more information  American Diabetes Association: www.diabetes.org  American Association of Diabetes Educators: www.diabeteseducator.org/patient-resources Contact a doctor if:  Your blood sugar is at or above 240 mg/dL (13.3 mmol/L) for  2 days in a row.  You have been sick or have had a fever for 2 days or more, and you are not getting better.  You have any of these problems for more than 6 hours: ? You cannot eat or drink. ? You feel sick to your stomach (nauseous). ? You throw up (vomit). ? You have watery poop (diarrhea). Get help right away if:  Your blood sugar is lower than 54 mg/dL (3 mmol/L).  You get confused.  You have trouble: ? Thinking clearly. ? Breathing. Summary  Diabetes (diabetes mellitus) is a long-term (chronic) disease. It occurs when the body does not properly use sugar (glucose) that is released from food after digestion.  Take insulin and diabetes medicines as told.  Check your blood sugar every day, as often as told.  Keep all follow-up visits as told by your doctor. This is important. This information is not intended to replace advice given to you by your health care provider. Make sure you discuss any questions you have with your health care provider. Document Released: 01/18/2018 Document Revised: 12/06/2018 Document Reviewed: 01/18/2018 Elsevier Patient Education  [place].  Diabetes Mellitus and [place] care is an important part of your health, especially when you have diabetes. Diabetes may cause you to have problems because of poor blood flow (circulation) to your feet and legs, which can cause your skin to:  Become thinner and drier.  Break more easily.  Heal more slowly.  Peel and crack. You may also have nerve damage (neuropathy) in your legs and feet, causing decreased feeling in them. This means that you may not notice minor injuries to your feet that could lead to more serious problems. Noticing and addressing any potential problems early is the best way to prevent future foot problems. How to care for your feet Foot hygiene  Wash your feet daily with warm water and mild soap. Do not use hot water. Then,  pat your feet and the areas between your toes  until they are completely dry. Do not soak your feet as this can dry your skin.  Trim your toenails straight across. Do not dig under them or around the cuticle. File the edges of your nails with an emery board or nail file.  Apply a moisturizing lotion or petroleum jelly to the skin on your feet and to dry, brittle toenails. Use lotion that does not contain alcohol and is unscented. Do not apply lotion between your toes. Shoes and socks  Wear clean socks or stockings every day. Make sure they are not too tight. Do not wear knee-high stockings since they may decrease blood flow to your legs.  Wear shoes that fit properly and have enough cushioning. Always look in your shoes before you put them on to be sure there are no objects inside.  To break in new shoes, wear them for just a few hours a day. This prevents injuries on your feet. Wounds, scrapes, corns, and calluses  Check your feet daily for blisters, cuts, bruises, sores, and redness. If you cannot see the bottom of your feet, use a mirror or ask someone for help.  Do not cut corns or calluses or try to remove them with medicine.  If you find a minor scrape, cut, or break in the skin on your feet, keep it and the skin around it clean and dry. You may clean these areas with mild soap and water. Do not clean the area with peroxide, alcohol, or iodine.  If you have a wound, scrape, corn, or callus on your foot, look at it several times a day to make sure it is healing and not infected. Check for: ? Redness, swelling, or pain. ? Fluid or blood. ? Warmth. ? Pus or a bad smell. General instructions  Do not cross your legs. This may decrease blood flow to your feet.  Do not use heating pads or hot water bottles on your feet. They may burn your skin. If you have lost feeling in your feet or legs, you may not know this is happening until it is too late.  Protect your feet from hot and cold by wearing shoes, such as at the beach or on hot  pavement.  Schedule a complete foot exam at least once a year (annually) or more often if you have foot problems. If you have foot problems, report any cuts, sores, or bruises to your health care provider immediately. Contact a health care provider if:  You have a medical condition that increases your risk of infection and you have any cuts, sores, or bruises on your feet.  You have an injury that is not healing.  You have redness on your legs or feet.  You feel burning or tingling in your legs or feet.  You have pain or cramps in your legs and feet.  Your legs or feet are numb.  Your feet always feel cold.  You have pain around a toenail. Get help right away if:  You have a wound, scrape, corn, or callus on your foot and: ? You have pain, swelling, or redness that gets worse. ? You have fluid or blood coming from the wound, scrape, corn, or callus. ? Your wound, scrape, corn, or callus feels warm to the touch. ? You have pus or a bad smell coming from the wound, scrape, corn, or callus. ? You have a fever. ? You have a red line going up your  leg. Summary  Check your feet every day for cuts, sores, red spots, swelling, and blisters.  Moisturize feet and legs daily.  Wear shoes that fit properly and have enough cushioning.  If you have foot problems, report any cuts, sores, or bruises to your health care provider immediately.  Schedule a complete foot exam at least once a year (annually) or more often if you have foot problems. This information is not intended to replace advice given to you by your health care provider. Make sure you discuss any questions you have with your health care provider. Document Released: 10/13/2000 Document Revised: 11/28/2017 Document Reviewed: 11/17/2016 Elsevier Patient Education  2020 [employer].

## 2019-08-07 DIAGNOSIS — D3132 Benign neoplasm of left choroid: Secondary | ICD-10-CM | POA: Diagnosis not present

## 2019-08-07 DIAGNOSIS — H04123 Dry eye syndrome of bilateral lacrimal glands: Secondary | ICD-10-CM | POA: Diagnosis not present

## 2019-08-07 DIAGNOSIS — D3131 Benign neoplasm of right choroid: Secondary | ICD-10-CM | POA: Diagnosis not present

## 2019-08-07 DIAGNOSIS — E119 Type 2 diabetes mellitus without complications: Secondary | ICD-10-CM | POA: Diagnosis not present

## 2019-08-07 DIAGNOSIS — H17821 Peripheral opacity of cornea, right eye: Secondary | ICD-10-CM | POA: Diagnosis not present

## 2019-08-07 DIAGNOSIS — H18593 Other hereditary corneal dystrophies, bilateral: Secondary | ICD-10-CM | POA: Diagnosis not present

## 2019-08-07 DIAGNOSIS — Z961 Presence of intraocular lens: Secondary | ICD-10-CM | POA: Diagnosis not present

## 2019-08-07 LAB — HM DIABETES EYE EXAM

## 2019-08-12 ENCOUNTER — Other Ambulatory Visit: Payer: Self-pay | Admitting: *Deleted

## 2019-08-12 DIAGNOSIS — Z20822 Contact with and (suspected) exposure to covid-19: Secondary | ICD-10-CM

## 2019-08-14 LAB — NOVEL CORONAVIRUS, NAA: SARS-CoV-2, NAA: NOT DETECTED

## 2019-08-20 ENCOUNTER — Ambulatory Visit (INDEPENDENT_AMBULATORY_CARE_PROVIDER_SITE_OTHER): Payer: HMO | Admitting: Family Medicine

## 2019-08-20 ENCOUNTER — Other Ambulatory Visit: Payer: Self-pay | Admitting: Family Medicine

## 2019-08-20 ENCOUNTER — Other Ambulatory Visit: Payer: Self-pay

## 2019-08-20 VITALS — BP 141/74 | HR 72 | Temp 98.3°F | Ht 61.0 in | Wt 165.0 lb

## 2019-08-20 DIAGNOSIS — R319 Hematuria, unspecified: Secondary | ICD-10-CM | POA: Diagnosis not present

## 2019-08-20 DIAGNOSIS — Z87448 Personal history of other diseases of urinary system: Secondary | ICD-10-CM

## 2019-08-20 DIAGNOSIS — N390 Urinary tract infection, site not specified: Secondary | ICD-10-CM | POA: Diagnosis not present

## 2019-08-20 LAB — POCT URINALYSIS DIP (CLINITEK)
Bilirubin, UA: NEGATIVE
Glucose, UA: NEGATIVE mg/dL
Ketones, POC UA: NEGATIVE mg/dL
Nitrite, UA: POSITIVE — AB
POC PROTEIN,UA: NEGATIVE
Spec Grav, UA: 1.01 (ref 1.010–1.025)
Urobilinogen, UA: 0.2 E.U./dL
pH, UA: 6.5 (ref 5.0–8.0)

## 2019-08-20 MED ORDER — SULFAMETHOXAZOLE-TRIMETHOPRIM 400-80 MG PO TABS: 1.0000 | ORAL_TABLET | Freq: Two times a day (BID) | ORAL | 0 refills | Status: DC

## 2019-08-20 NOTE — Progress Notes (Signed)
Acute Office Visit  Subjective:    Patient ID: [name], female    DOB: Dec 15, 1934, 84 y.o.   MRN: [id number]  Chief Complaint  Patient presents with  . Urinary Tract Infection    been taking otc Phenazopyridine Hydrochloride 37m     HPI Patient is in today for UTI. Pt states in the past treated for recurrent UTI. Renal u/s showed small kidney stones-reviewed report. Pt states no pain in the back or abdomin. Pt using anti spasm medication. Pt trying to watch diet for DM-no sugary drinks and limiting carbs. No fever  Past Medical History:  Diagnosis Date  . [place]   . Diabetes mellitus without complication ([place]   . Hypertension     Past Surgical History:  Procedure Laterality Date  . BREAST SURGERY      No family history on file.  Social History   Socioeconomic History  . Marital status: Married    Spouse name: Not on file  . Number of children: Not on file  . Years of education: Not on file  . Highest education level: Not on file  Occupational History  . Occupation: retired  SScientific laboratory technician . Financial resource strain: Not on file  . Food insecurity    Worry: Never true    Inability: Never true  . Transportation needs    Medical: No    Non-medical: No  Tobacco Use  . Smoking status: Former Smoker    Packs/day: 1.00    Years: 15.00    Pack years: 15.00    Types: Cigarettes    Start date: 09/14/1955    Quit date: 07/14/1969    Years since quitting: 50.1  . Smokeless tobacco: Never Used  . Tobacco comment: quit 40 years ago  Substance and Sexual Activity  . Alcohol use: Never    Alcohol/week: 0.0 standard drinks    Frequency: Never  . Drug use: Never  . Sexual activity: Not Currently  Lifestyle  . Physical activity    Days per week: Not on file    Minutes per session: Not on file  . Stress: Not on file  Relationships  . Social c[place]bal[place] phone: Not on file    Gets together: Not on file    Attends religious service: Not  on file    Active member of club or organization: Not on file    Attends meetings of clubs or organizations: Not on file    Relationship status: Not on file  . Intimate partner violence    Fear of current or ex partner: Not on file    Emotionally abused: Not on file    Physically abused: Not on file    Forced sexual activity: Not on file  Other Topics Concern  . Not on file  Social History Narrative  . Not on file    Outpatient Medications Prior to Visit  Medication Sig Dispense Refill  . aspirin 81 MG tablet Take 81 mg by mouth daily.      . B Complex-C (SUPER B COMPLEX) TABS Take 1 tablet by mouth daily.      . Cholecalciferol (VITAMIN D-3) 1000 UNITS CAPS Take 1,000 Units by mouth 2 (two) times daily.     .Marland Kitchenglimepiride (AMARYL) 2 MG tablet TK 1 T PO QD    . losartan (COZAAR) 50 MG tablet Take 50 mg by mouth daily.    . metFORMIN (GLUCOPHAGE) 500 MG tablet Take 1,000 mg by  mouth 2 (two) times daily with a meal. 2-- 1000 mg  Tabs in AM and PM    . omeprazole (PRILOSEC) 20 MG capsule Take 20 mg by mouth daily.    Vladimir Faster Glycol-Propyl Glycol (SYSTANE OP) Place 1 drop into both eyes daily as needed (dry eyes).    . rosuvastatin (CRESTOR) 10 MG tablet Take 0.5 mg by mouth 2 (two) times a week. Monday and Friday    . sodium chloride (MURO 128) 2 % ophthalmic solution Place 1 drop into the left eye daily.      No facility-administered medications prior to visit.     No Known Allergies  Review of Systems  Constitutional: Negative for fever and malaise/fatigue.  Respiratory: Negative.   Cardiovascular: Negative.   Genitourinary: Positive for hematuria. Negative for dysuria and frequency.       Objective:    Physical Exam  Constitutional: She is oriented to person, place, and time. She appears well-developed and well-nourished.  HENT:  Head: Normocephalic and atraumatic.  Eyes: Conjunctivae are normal.  Cardiovascular: Normal rate and regular rhythm.  Pulmonary/Chest:  Effort normal and breath sounds normal.  Abdominal: Soft.  Neurological: She is oriented to person, place, and time.  Psychiatric: She has a normal mood and affect.    BP (!) 141/74 (BP Location: Left Arm, Patient Position: Sitting, Cuff Size: Normal)   Pulse 72   Temp 98.3 F (36.8 C) (Oral)   Ht '5\' 1"'  (1.549 m)   Wt 165 lb (74.8 kg)   SpO2 96%   BMI 31.18 kg/m  Wt Readings from Last 3 Encounters:  08/20/19 165 lb (74.8 kg)  07/15/19 163 lb (73.9 kg)  10/02/17 159 lb (72.1 kg)    Health Maintenance Due  Topic Date Due  . TETANUS/TDAP  03/31/1956  . DEXA SCAN  03/31/2002  . PNA vac Low Risk Adult (1 of 2 - PCV13) 03/31/2002  . INFLUENZA VACCINE  05/31/2019    There are no preventive care reminders to display for this patient.   No results found for: TSH Lab Results  Component Value Date   WBC 8.5 10/02/2017   HGB 13.3 10/02/2017   HCT 39.0 10/02/2017   MCV 88.1 10/02/2017   PLT 250 10/02/2017   Lab Results  Component Value Date   NA 139 10/02/2017   K 3.8 10/02/2017   CHLORIDE 107 08/12/2015   CO2 22 10/02/2017   GLUCOSE 165 (H) 10/02/2017   BUN 12 10/02/2017   CREATININE 10 MG/DL 07/15/2019   BILITOT 0.5 10/02/2017   ALKPHOS 73 10/02/2017   AST 25 10/02/2017   ALT 22 10/02/2017   PROT 6.5 10/02/2017   ALBUMIN 150 MG/L 07/15/2019   CALCIUM 9.9 10/02/2017   ANIONGAP 9 10/02/2017   EGFR 70 (L) 08/12/2015   Lab Results  Component Value Date   HGBA1C 6.3 (A) 07/15/2019      Assessment & Plan:   1. Hx of hematuria - POCT URINALYSIS DIP (CLINITEK)  2. Urinary tract infection with hematuria, site unspecified H/o UTI-bactrim-rx-culture-urology-multiple infections - Ambulatory referral to Urology   Sharri Loya Hannah Beat, MD

## 2019-08-20 NOTE — Patient Instructions (Signed)
Bactrim-pick up medication at pharmacy  Continue to drink plenty of water  Urology referral

## 2019-08-26 DIAGNOSIS — L719 Rosacea, unspecified: Secondary | ICD-10-CM | POA: Diagnosis not present

## 2019-08-26 DIAGNOSIS — L570 Actinic keratosis: Secondary | ICD-10-CM | POA: Diagnosis not present

## 2019-09-04 ENCOUNTER — Other Ambulatory Visit: Payer: Self-pay

## 2019-09-04 ENCOUNTER — Ambulatory Visit (INDEPENDENT_AMBULATORY_CARE_PROVIDER_SITE_OTHER): Payer: HMO | Admitting: Family Medicine

## 2019-09-04 ENCOUNTER — Encounter: Payer: Self-pay | Admitting: Family Medicine

## 2019-09-04 VITALS — BP 130/70 | HR 69 | Temp 98.5°F | Ht 61.0 in | Wt 166.4 lb

## 2019-09-04 DIAGNOSIS — Z87448 Personal history of other diseases of urinary system: Secondary | ICD-10-CM

## 2019-09-04 LAB — POCT URINALYSIS DIP (CLINITEK)
Bilirubin, UA: NEGATIVE
Glucose, UA: NEGATIVE mg/dL
Ketones, POC UA: NEGATIVE mg/dL
Nitrite, UA: NEGATIVE
Spec Grav, UA: 1.01 (ref 1.010–1.025)
Urobilinogen, UA: 0.2 E.U./dL
pH, UA: 6 (ref 5.0–8.0)

## 2019-09-04 MED ORDER — PHENAZOPYRIDINE HCL 100 MG PO TABS: 100.0000 mg | ORAL_TABLET | Freq: Three times a day (TID) | ORAL | 0 refills | Status: DC | PRN

## 2019-09-04 MED ORDER — NITROFURANTOIN MONOHYD MACRO 100 MG PO CAPS: 100.0000 mg | ORAL_CAPSULE | Freq: Two times a day (BID) | ORAL | 0 refills | Status: DC

## 2019-09-04 NOTE — Progress Notes (Signed)
Established Patient Office Visit  Subjective:  Patient ID: [name], female    DOB: 1935-09-24  Age: 84 y.o. MRN: [id number]  CC:  Chief Complaint  Patient presents with  . Urinary Tract Infection    possible    HPI [name] presents for for possible UTI-came home from the beach due to increase in pulling sensation in the uro-vaginal area. NO blood noted in the urine. No nocturia or frequency. Pt states "uncomfortable" Previously diagnosed with UTI-completed treatment. No fever, no back pain  Past Medical History:  Diagnosis Date  . Cancer ([place])   . Diabetes mellitus without complication ([place])   . Hypertension     Past Surgical History:  Procedure Laterality Date  . BREAST SURGERY      No family history on file.  Social History   Socioeconomic History  . Marital status: Married    Spouse name: Not on file  . Number of children: Not on file  . Years of education: Not on file  . Highest education level: Not on file  Occupational History  . Occupation: retired  Scientific laboratory technician  . Financial resource strain: Not on file  . Food insecurity    Worry: Never true    Inability: Never true  . Transportation needs    Medical: No    Non-medical: No  Tobacco Use  . Smoking status: Former Smoker    Packs/day: 1.00    Years: 15.00    Pack years: 15.00    Types: Cigarettes    Start date: 09/14/1955    Quit date: 07/14/1969    Years since quitting: 50.1  . Smokeless tobacco: Never Used  . Tobacco comment: quit 40 years ago  Substance and Sexual Activity  . Alcohol use: Never    Alcohol/week: 0.0 standard drinks    Frequency: Never  . Drug use: Never  . Sexual activity: Not Currently  Lifestyle  . Physical activity    Days per week: Not on file    Minutes per session: Not on file  . Stress: Not on file  Relationships  . Social Herbalist on phone: Not on file    Gets together: Not on file    Attends religious service: Not on file    Active  member of club or organization: Not on file    Attends meetings of clubs or organizations: Not on file    Relationship status: Not on file  . Intimate partner violence    Fear of current or ex partner: Not on file    Emotionally abused: Not on file    Physically abused: Not on file    Forced sexual activity: Not on file  Other Topics Concern  . Not on file  Social History Narrative  . Not on file    Outpatient Medications Prior to Visit  Medication Sig Dispense Refill  . aspirin 81 MG tablet Take 81 mg by mouth daily.      . B Complex-C (SUPER B COMPLEX) TABS Take 1 tablet by mouth daily.      . Cholecalciferol (VITAMIN D-3) 1000 UNITS CAPS Take 1,000 Units by mouth 2 (two) times daily.     Marland Kitchen glimepiride (AMARYL) 2 MG tablet TK 1 T PO QD    . losartan (COZAAR) 50 MG tablet Take 50 mg by mouth daily.    . metFORMIN (GLUCOPHAGE) 500 MG tablet Take 1,000 mg by mouth 2 (two) times daily with a meal. 2-- 1000  mg  Tabs in AM and PM    . omeprazole (PRILOSEC) 20 MG capsule Take 20 mg by mouth daily.    Vladimir Faster Glycol-Propyl Glycol (SYSTANE OP) Place 1 drop into both eyes daily as needed (dry eyes).    . rosuvastatin (CRESTOR) 10 MG tablet Take 0.5 mg by mouth 2 (two) times a week. Monday and Friday    . sodium chloride (MURO 128) 2 % ophthalmic solution Place 1 drop into the left eye daily.     Marland Kitchen sulfamethoxazole-trimethoprim (BACTRIM) 400-80 MG tablet Take 1 tablet by mouth 2 (two) times daily. 10 tablet 0   No facility-administered medications prior to visit.     No Known Allergies  ROS Review of Systems  Constitutional: Negative.   Gastrointestinal: Negative.   Genitourinary: Positive for difficulty urinating, dysuria and urgency. Negative for flank pain.      Objective:    Physical Exam  BP 130/70 (BP Location: Right Arm, Patient Position: Sitting, Cuff Size: Normal)   Pulse 69   Temp 98.5 F (36.9 C) (Oral)   Ht _0  (1.549 m)   Wt 166 lb 6.4 oz (75.5 kg)   SpO2  97%   BMI 31.44 kg/m  Wt Readings from Last 3 Encounters:  09/04/19 166 lb 6.4 oz (75.5 kg)  08/20/19 165 lb (74.8 kg)  07/15/19 163 lb (73.9 kg)     Health Maintenance Due  Topic Date Due  . TETANUS/TDAP  03/31/1956  . DEXA SCAN  03/31/2002  . PNA vac Low Risk Adult (1 of 2 - PCV13) 03/31/2002  . INFLUENZA VACCINE  05/31/2019   Lab Results  Component Value Date   WBC 8.5 10/02/2017   HGB 13.3 10/02/2017   HCT 39.0 10/02/2017   MCV 88.1 10/02/2017   PLT 250 10/02/2017   Lab Results  Component Value Date   NA 139 10/02/2017   K 3.8 10/02/2017   CHLORIDE 107 08/12/2015   CO2 22 10/02/2017   GLUCOSE 165 (H) 10/02/2017   BUN 12 10/02/2017   CREATININE 10 MG/DL 07/15/2019   BILITOT 0.5 10/02/2017   ALKPHOS 73 10/02/2017   AST 25 10/02/2017   ALT 22 10/02/2017   PROT 6.5 10/02/2017   ALBUMIN 150 MG/L 07/15/2019   CALCIUM 9.9 10/02/2017   ANIONGAP 9 10/02/2017   EGFR 70 (L) 08/12/2015   Lab Results  Component Value Date   HGBA1C 6.3 (A) 07/15/2019      Assessment & Plan:  1. Hx of hematuria - POCT URINALYSIS DIP (CLINITEK) - Urine Culture-took bactrim in the past-switch to macrobid-urology referral pending Follow-up:  Urology for recurrent UTI   LISA Hannah Beat, MD

## 2019-09-04 NOTE — Patient Instructions (Signed)
Drink plenty of water-avoid caffeine  Will notify results of culture on Monday  Tylenol for pain

## 2019-09-05 LAB — URINE CULTURE
MICRO NUMBER:: 1015697
SPECIMEN QUALITY:: ADEQUATE

## 2019-09-05 LAB — HOUSE ACCOUNT TRACKING

## 2019-09-06 LAB — URINE CULTURE
MICRO NUMBER:: 1070689
SPECIMEN QUALITY:: ADEQUATE

## 2019-09-08 DIAGNOSIS — Z87448 Personal history of other diseases of urinary system: Secondary | ICD-10-CM | POA: Insufficient documentation

## 2019-09-10 ENCOUNTER — Ambulatory Visit (INDEPENDENT_AMBULATORY_CARE_PROVIDER_SITE_OTHER): Payer: HMO

## 2019-09-10 ENCOUNTER — Other Ambulatory Visit: Payer: Self-pay

## 2019-09-10 DIAGNOSIS — N390 Urinary tract infection, site not specified: Secondary | ICD-10-CM | POA: Diagnosis not present

## 2019-09-10 DIAGNOSIS — R319 Hematuria, unspecified: Secondary | ICD-10-CM

## 2019-09-10 LAB — POCT URINALYSIS DIP (CLINITEK)
Bilirubin, UA: NEGATIVE
Glucose, UA: NEGATIVE mg/dL
Ketones, POC UA: NEGATIVE mg/dL
Nitrite, UA: NEGATIVE
POC PROTEIN,UA: NEGATIVE
Spec Grav, UA: <=1.005 — AB (ref 1.010–1.025)
Urobilinogen, UA: 0.2 E.U./dL
pH, UA: 6.5 (ref 5.0–8.0)

## 2019-09-12 LAB — URINE CULTURE
MICRO NUMBER:: 1090462
SPECIMEN QUALITY:: ADEQUATE

## 2019-09-22 LAB — ABI
Left ABI: >1
Right ABI: >1

## 2019-09-24 ENCOUNTER — Other Ambulatory Visit: Payer: Self-pay | Admitting: Family Medicine

## 2019-09-26 NOTE — Telephone Encounter (Signed)
Requested medication (s) are due for refill today: no  Requested medication (s) are on the active medication list: yes  Last refill:  09/04/2019  Future visit scheduled: no  Notes to clinic:  Review for refill   Requested Prescriptions  Pending Prescriptions Disp Refills   nitrofurantoin, macrocrystal-monohydrate, (MACROBID) 100 MG capsule [Pharmacy Med Name: NITROFURANTOIN MONO/MAC 100MG CAPS] 10 capsule 0    Sig: TAKE 1 CAPSULE(100 MG) BY MOUTH TWICE DAILY     There is no refill protocol information for this order

## 2019-09-29 NOTE — Telephone Encounter (Signed)
Looks like pt needs a recheck on urinalysis. We dont write for chronic antibiotics. When is pts urology appt?

## 2019-09-29 NOTE — Telephone Encounter (Signed)
Does the patient have a urology appt? We do not refill long term antibiotics .

## 2019-09-29 NOTE — Telephone Encounter (Signed)
Patient is seeing Dr. Tresa Moore 10/11/19 /patient is coming tomorrow for urine dipstick

## 2019-09-30 ENCOUNTER — Other Ambulatory Visit: Payer: Self-pay | Admitting: Family Medicine

## 2019-09-30 ENCOUNTER — Ambulatory Visit (INDEPENDENT_AMBULATORY_CARE_PROVIDER_SITE_OTHER): Payer: HMO

## 2019-09-30 ENCOUNTER — Other Ambulatory Visit: Payer: Self-pay

## 2019-09-30 DIAGNOSIS — R319 Hematuria, unspecified: Secondary | ICD-10-CM

## 2019-09-30 DIAGNOSIS — N390 Urinary tract infection, site not specified: Secondary | ICD-10-CM

## 2019-09-30 LAB — POCT URINALYSIS DIP (CLINITEK)
Bilirubin, UA: NEGATIVE
Glucose, UA: 100 mg/dL — AB
Ketones, POC UA: NEGATIVE mg/dL
Nitrite, UA: NEGATIVE
POC PROTEIN,UA: 30 — AB
Spec Grav, UA: 1.02 (ref 1.010–1.025)
Urobilinogen, UA: 0.2 E.U./dL
pH, UA: 7 (ref 5.0–8.0)

## 2019-09-30 MED ORDER — NITROFURANTOIN MONOHYD MACRO 100 MG PO CAPS: 100.0000 mg | ORAL_CAPSULE | Freq: Two times a day (BID) | ORAL | 0 refills | Status: DC

## 2019-10-01 ENCOUNTER — Other Ambulatory Visit: Payer: Self-pay

## 2019-10-01 NOTE — Patient Outreach (Signed)
  [facility] [facility]) Care Management Chronic Special Needs Program  10/01/2019  Name: AMBRIA ANSPAUGH DOB: Mar 23, 1935  MRN: [id number]  Ms. Jeileen Rajan is enrolled in a chronic special needs plan for Diabetes. Reviewed and updated care plan.  Subjective: Client states that her Hemoglobin A1C had gone down to 6.3% when it was checked last.  States she does not check her blood sugars as her Hemoglobin A1C is good.  Denies ever feeling like her blood sugar is low.  States she has been having a problem with frequent UTI and blood in her urine and she is on an antibiotic now.  States she is to see a urologist 10/07/19.  Denies any problems with getting and taking her medications.  States her B/P has been good when checked.  States she tries to walk 1 1/2 miles every day weather permitting.  States she is trying to watch her CHO portions and tries to eat more fruit and vegetables.  Goals Addressed            This Visit's Progress   . COMPLETED: Client understands the importance of follow-up with providers by attending scheduled visits   On track    Reports keeping appts    . Client will verbalize knowledge of self management of Hypertension as evidences by BP reading of 140/90 or less; or as defined by provider   On track    Reports B/P in range    . HEMOGLOBIN A1C < 7.0       Diabetes self management actions:  Glucose monitoring per provider recommendations  Eat Healthy  Check feet daily  Visit provider every 3-6 months as directed  Hbg A1C level every 3-6 months.  Eye Exam yearly    . Maintain timely refills of diabetic medication as prescribed within the year .   On track    Reports on difficulty with medications    . COMPLETED: Obtain annual  Lipid Profile, LDL-C       Completed 07/15/19    . COMPLETED: Obtain Annual Eye (retinal)  Exam        Completed 08/07/19    . COMPLETED: Obtain Annual Foot Exam       Completed 07/15/19    . COMPLETED: Obtain annual  screen for micro albuminuria (urine) , nephropathy (kidney problems)   On track    Completed 07/15/19    . COMPLETED: Obtain Hemoglobin A1C at least 2 times per year       Completed 12/24/18, 5/18/209/15/20    . COMPLETED: Visit Primary Care Provider or Endocrinologist at least 2 times per year        Completed 03/17/19, 07/15/19, 09/04/19     Client is meeting diabetes self management goal of hemoglobin A1C of <7% with last reading of 6.3% Reinforced to follow a low CHO low sodium diet.  Reviewed fruit portion sizes Encouraged to continue to get regular exercise Encouraged to drink adequate amounts of fluids Reviewed number for 24-hour nurse Line Reviewed COVID 19 precautions  Plan:  Send successful outreach letter with a copy of their individualized care plan, Send individual care plan to provider and Send educational material [place] 2021 calendar  Chronic care management coordinator will outreach in:  6- 9 months per tier level     [place], Jackquline [place], [place] [facility] [phone]

## 2019-10-02 LAB — URINE CULTURE
MICRO NUMBER:: 1152211
SPECIMEN QUALITY:: ADEQUATE

## 2019-10-07 ENCOUNTER — Ambulatory Visit: Payer: HMO | Admitting: Family Medicine

## 2019-10-07 DIAGNOSIS — N3020 Other chronic cystitis without hematuria: Secondary | ICD-10-CM | POA: Diagnosis not present

## 2019-10-07 DIAGNOSIS — N200 Calculus of kidney: Secondary | ICD-10-CM | POA: Diagnosis not present

## 2019-10-09 ENCOUNTER — Ambulatory Visit: Payer: HMO | Admitting: Family Medicine

## 2019-10-14 ENCOUNTER — Other Ambulatory Visit: Payer: Self-pay

## 2019-10-14 ENCOUNTER — Telehealth (INDEPENDENT_AMBULATORY_CARE_PROVIDER_SITE_OTHER): Payer: HMO | Admitting: Family Medicine

## 2019-10-14 DIAGNOSIS — E559 Vitamin D deficiency, unspecified: Secondary | ICD-10-CM | POA: Diagnosis not present

## 2019-10-14 DIAGNOSIS — I10 Essential (primary) hypertension: Secondary | ICD-10-CM | POA: Diagnosis not present

## 2019-10-14 DIAGNOSIS — N3020 Other chronic cystitis without hematuria: Secondary | ICD-10-CM | POA: Diagnosis not present

## 2019-10-14 DIAGNOSIS — E119 Type 2 diabetes mellitus without complications: Secondary | ICD-10-CM

## 2019-10-14 NOTE — Progress Notes (Signed)
Virtual Visit via Telephone Note  I connected with [name] on 10/14/19 at 10:40 AM EST by telephone and verified that I am speaking with the correct person using two identifiers.DOB/address  Location: Patient: home Provider: clinic   I discussed the limitations, risks, security and privacy concerns of performing an evaluation and management service by telephone and the availability of in person appointments. I also discussed with the patient that there may be a patient responsible charge related to this service. The patient expressed understanding and agreed to proceed.   History of Present Illness: Urology -asked pt to take macrobid at night Constipation-overnight-pt took tylenol HTN-losartan-pt is not checking bp readings at home DM-pt is not checking glucose readings at home-improved A1c with glipizide/metformin   Observations/Objective: none  Assessment and Plan: Vitamin D deficiency - Lipid panel - VITAMIN D 25 Hydroxy (Vit-D Deficiency, Fractures) 2. Essential hypertension - Comprehensive Metabolic Panel (CMET) Losartan-taking daily-consider checking bp at home 3. Diabetes mellitus without complication ([place]) - Hemoglobin A1c-consider checking glucose at home Follow Up Instructions: Fasting labwork   I discussed the assessment and treatment plan with the patient. The patient was provided an opportunity to ask questions and all were answered. The patient agreed with the plan and demonstrated an understanding of the instructions.   The patient was advised to call back or seek an in-person evaluation if the symptoms worsen or if the condition fails to improve as anticipated.  I provided 10 minutes of non-face-to-face time during this encounter.   Taliya Mcclard Hannah Beat, MD

## 2019-10-18 NOTE — Patient Instructions (Signed)
labwork recommended-fasting

## 2019-10-29 ENCOUNTER — Ambulatory Visit: Payer: HMO | Attending: Internal Medicine

## 2019-10-29 ENCOUNTER — Other Ambulatory Visit: Payer: Self-pay

## 2019-10-29 DIAGNOSIS — Z20828 Contact with and (suspected) exposure to other viral communicable diseases: Secondary | ICD-10-CM | POA: Diagnosis not present

## 2019-10-29 DIAGNOSIS — Z20822 Contact with and (suspected) exposure to covid-19: Secondary | ICD-10-CM

## 2019-10-30 LAB — NOVEL CORONAVIRUS, NAA: SARS-CoV-2, NAA: NOT DETECTED

## 2019-11-06 DIAGNOSIS — E559 Vitamin D deficiency, unspecified: Secondary | ICD-10-CM | POA: Diagnosis not present

## 2019-11-06 DIAGNOSIS — E119 Type 2 diabetes mellitus without complications: Secondary | ICD-10-CM | POA: Diagnosis not present

## 2019-11-06 DIAGNOSIS — I10 Essential (primary) hypertension: Secondary | ICD-10-CM | POA: Diagnosis not present

## 2019-11-07 ENCOUNTER — Other Ambulatory Visit: Payer: Self-pay | Admitting: Family Medicine

## 2019-11-07 LAB — COMPREHENSIVE METABOLIC PANEL
AG Ratio: 1.4 (calc) (ref 1.0–2.5)
ALT: 26 U/L (ref 6–29)
AST: 19 U/L (ref 10–35)
Albumin: 4.2 g/dL (ref 3.6–5.1)
Alkaline phosphatase (APISO): 67 U/L (ref 37–153)
BUN/Creatinine Ratio: 16 (calc) (ref 6–22)
BUN: 14 mg/dL (ref 7–25)
CO2: 27 mmol/L (ref 20–32)
Calcium: 11.4 mg/dL — ABNORMAL HIGH (ref 8.6–10.4)
Chloride: 100 mmol/L (ref 98–110)
Creat: 0.89 mg/dL — ABNORMAL HIGH (ref 0.60–0.88)
Globulin: 2.9 g/dL (calc) (ref 1.9–3.7)
Glucose, Bld: 160 mg/dL — ABNORMAL HIGH (ref 65–99)
Potassium: 4.7 mmol/L (ref 3.5–5.3)
Sodium: 136 mmol/L (ref 135–146)
Total Bilirubin: 0.6 mg/dL (ref 0.2–1.2)
Total Protein: 7.1 g/dL (ref 6.1–8.1)

## 2019-11-07 LAB — HEMOGLOBIN A1C
Hgb A1c MFr Bld: 6.6 % of total Hgb — ABNORMAL HIGH (ref <5.7)
Mean Plasma Glucose: 143 (calc)
eAG (mmol/L): 7.9 (calc)

## 2019-11-07 LAB — LIPID PANEL
Cholesterol: 147 mg/dL (ref <200)
HDL: 31 mg/dL — ABNORMAL LOW (ref >=50)
LDL Cholesterol (Calc): 86 mg/dL (calc)
Non-HDL Cholesterol (Calc): 116 mg/dL (calc) (ref <130)
Total CHOL/HDL Ratio: 4.7 (calc) (ref <5.0)
Triglycerides: 207 mg/dL — ABNORMAL HIGH (ref <150)

## 2019-11-07 LAB — VITAMIN D 25 HYDROXY (VIT D DEFICIENCY, FRACTURES): Vit D, 25-Hydroxy: 39 ng/mL (ref 30–100)

## 2019-11-10 ENCOUNTER — Encounter: Payer: Self-pay | Admitting: Family Medicine

## 2019-11-10 ENCOUNTER — Other Ambulatory Visit: Payer: Self-pay

## 2019-11-10 ENCOUNTER — Ambulatory Visit (INDEPENDENT_AMBULATORY_CARE_PROVIDER_SITE_OTHER): Payer: HMO | Admitting: Family Medicine

## 2019-11-10 VITALS — BP 118/70 | HR 91 | Temp 97.6°F | Ht 61.0 in | Wt 166.0 lb

## 2019-11-10 DIAGNOSIS — N390 Urinary tract infection, site not specified: Secondary | ICD-10-CM | POA: Diagnosis not present

## 2019-11-10 DIAGNOSIS — R319 Hematuria, unspecified: Secondary | ICD-10-CM

## 2019-11-10 LAB — POCT URINALYSIS DIP (CLINITEK)
Bilirubin, UA: NEGATIVE
Glucose, UA: 250 mg/dL — AB
Ketones, POC UA: NEGATIVE mg/dL
Nitrite, UA: NEGATIVE
POC PROTEIN,UA: NEGATIVE
Spec Grav, UA: 1.02 (ref 1.010–1.025)
Urobilinogen, UA: 0.2 E.U./dL
pH, UA: 6.5 (ref 5.0–8.0)

## 2019-11-10 MED ORDER — AMOXICILLIN 250 MG PO CAPS: 250.0000 mg | ORAL_CAPSULE | Freq: Two times a day (BID) | ORAL | 0 refills | Status: DC

## 2019-11-10 NOTE — Progress Notes (Addendum)
Established Patient Office Visit  Subjective:  Patient ID: [name], female    DOB: 1935-03-10  Age: 85 y.o. MRN: [id number]  CC:  Chief Complaint  Patient presents with  . bladder issues    HPI [name] presents for concerns about UTI after being placed on macrobid daily by urology. Pt states she took additional medication Augmentin for treatment 3 weeks ago with recurrence of UTI symptoms.  Pt states pain noted on Friday.  Pt took macrobid at night daily as suggested by urology.  Culture + enterococcus in the past. No fever. No abdominal or back pain.  Past Medical History:  Diagnosis Date  . Cancer ([place])   . Diabetes mellitus without complication ([place])   . Hypertension     Past Surgical History:  Procedure Laterality Date  . BREAST SURGERY      No family history on file.  Social History   Socioeconomic History  . Marital status: Married    Spouse name: Not on file  . Number of children: Not on file  . Years of education: Not on file  . Highest education level: Not on file  Occupational History  . Occupation: retired  Tobacco Use  . Smoking status: Former Smoker    Packs/day: 1.00    Years: 15.00    Pack years: 15.00    Types: Cigarettes    Start date: 09/14/1955    Quit date: 07/14/1969    Years since quitting: 50.3  . Smokeless tobacco: Never Used  . Tobacco comment: quit 40 years ago  Substance and Sexual Activity  . Alcohol use: Never    Alcohol/week: 0.0 standard drinks  . Drug use: Never  . Sexual activity: Not Currently  Other Topics Concern  . Not on file  Social History Narrative  . Not on file   Social Determinants of Health   Financial Resource Strain:   . Difficulty of Paying Living Expenses: Not on file  Food Insecurity: No Food Insecurity  . Worried About Charity fundraiser in the Last Year: Never true  . Ran Out of Food in the Last Year: Never true  Transportation Needs: No Transportation Needs  . Lack of  Transportation (Medical): No  . Lack of Transportation (Non-Medical): No  Physical Activity:   . Days of Exercise per Week: Not on file  . Minutes of Exercise per Session: Not on file  Stress:   . Feeling of Stress : Not on file  Social Connections:   . Frequency of Communication with Friends and Family: Not on file  . Frequency of Social Gatherings with Friends and Family: Not on file  . Attends Religious Services: Not on file  . Active Member of Clubs or Organizations: Not on file  . Attends Archivist Meetings: Not on file  . Marital Status: Not on file  Intimate Partner Violence:   . Fear of Current or Ex-Partner: Not on file  . Emotionally Abused: Not on file  . Physically Abused: Not on file  . Sexually Abused: Not on file    Outpatient Medications Prior to Visit  Medication Sig Dispense Refill  . aspirin 81 MG tablet Take 81 mg by mouth daily.      . B Complex-C (SUPER B COMPLEX) TABS Take 1 tablet by mouth daily.      . Cholecalciferol (VITAMIN D-3) 1000 UNITS CAPS Take 1,000 Units by mouth 2 (two) times daily.     Marland Kitchen glimepiride (AMARYL) 2 MG  tablet TK 1 T PO QD    . losartan (COZAAR) 50 MG tablet Take 50 mg by mouth daily.    . metFORMIN (GLUCOPHAGE) 500 MG tablet Take 1,000 mg by mouth 2 (two) times daily with a meal. 2-- 1000 mg  Tabs in AM and PM    . nitrofurantoin, macrocrystal-monohydrate, (MACROBID) 100 MG capsule Take 1 capsule (100 mg total) by mouth 2 (two) times daily. 10 capsule 0  . omeprazole (PRILOSEC) 20 MG capsule Take 20 mg by mouth daily.    . phenazopyridine (PYRIDIUM) 100 MG tablet Take 1 tablet (100 mg total) by mouth 3 (three) times daily as needed for pain. 15 tablet 0  . Polyethyl Glycol-Propyl Glycol (SYSTANE OP) Place 1 drop into both eyes daily as needed (dry eyes).    . rosuvastatin (CRESTOR) 10 MG tablet Take 0.5 mg by mouth 2 (two) times a week. Monday and Friday    . sodium chloride (MURO 128) 2 % ophthalmic solution Place 1 drop  into the left eye daily.     Marland Kitchen sulfamethoxazole-trimethoprim (BACTRIM) 400-80 MG tablet Take 1 tablet by mouth 2 (two) times daily. 10 tablet 0   No facility-administered medications prior to visit.    No Known Allergies  ROS Review of Systems  Cardiovascular: Negative.   Gastrointestinal: Negative.   Genitourinary: Positive for dysuria and urgency. Negative for hematuria.      Objective:    Physical Exam  Constitutional: She is oriented to person, place, and time. She appears well-developed and well-nourished.  Cardiovascular: Normal rate and regular rhythm.  Pulmonary/Chest: Effort normal and breath sounds normal.  Neurological: She is oriented to person, place, and time.  Psychiatric: She has a normal mood and affect.    BP 118/70 (BP Location: Left Arm, Patient Position: Sitting, Cuff Size: Normal)   Pulse 91   Temp 97.6 F (36.4 C) (Temporal)   Ht _0  (1.549 m)   Wt 166 lb (75.3 kg)   SpO2 91%   BMI 31.37 kg/m  Wt Readings from Last 3 Encounters:  11/10/19 166 lb (75.3 kg)  09/04/19 166 lb 6.4 oz (75.5 kg)  08/20/19 165 lb (74.8 kg)     Health Maintenance Due  Topic Date Due  . TETANUS/TDAP  03/31/1956  . INFLUENZA VACCINE  05/31/2019    Lab Results  Component Value Date   TSH 4.00 03/17/2019   Lab Results  Component Value Date   WBC 6.9 03/17/2019   HGB 15.0 03/17/2019   HCT 15 (A) 03/17/2019   MCV 88.1 10/02/2017   PLT 317 03/17/2019   Lab Results  Component Value Date   NA 136 11/06/2019   K 4.7 11/06/2019   CHLORIDE 107 08/12/2015   CO2 27 11/06/2019   GLUCOSE 160 (H) 11/06/2019   BUN 14 11/06/2019   CREATININE 0.89 (H) 11/06/2019   BILITOT 0.6 11/06/2019   ALKPHOS 61 03/17/2019   AST 19 11/06/2019   ALT 26 11/06/2019   PROT 7.1 11/06/2019   ALBUMIN 150 MG/L 07/15/2019   CALCIUM 11.4 (H) 11/06/2019   ANIONGAP 9 10/02/2017   EGFR 70 (L) 08/12/2015   Lab Results  Component Value Date   CHOL 147 11/06/2019   Lab Results   Component Value Date   HDL 31 (L) 11/06/2019   Lab Results  Component Value Date   LDLCALC 86 11/06/2019   Lab Results  Component Value Date   TRIG 207 (H) 11/06/2019   Lab Results  Component Value Date  CHOLHDL 4.7 11/06/2019   Lab Results  Component Value Date   HGBA1C 6.6 (H) 11/06/2019      Assessment & Plan:  1. Urinary tract infection with hematuria, site unspecified Amoxil-previously enterococcus + to ampicillin-pt with macrobid daily-now with reoccurrence of symptoms. No fever - POCT URINALYSIS DIP (CLINITEK) - Urine Culture  Follow-up:  Urology to determine meds needed to prevent infection-macrobid is not preventing UTI symptoms 11-12-19-d/w urology by telephone concerns about macrobid and enterococcus-sens to vanc and ampicillin -urology will call patient for follow up and evaluation of re-occurring UTI symptoms on antibiotic therapy.    LISA Hannah Beat, MD

## 2019-11-10 NOTE — Patient Instructions (Signed)
Will call urology about continued medication  Drink water  Take amoxil twice a day

## 2019-11-12 LAB — URINE CULTURE
MICRO NUMBER:: 10029593
Result:: NO GROWTH
SPECIMEN QUALITY:: ADEQUATE

## 2019-11-24 LAB — TSH: TSH: 3.96 mIU/L (ref 0.40–4.50)

## 2019-11-24 LAB — PTH, INTACT AND CALCIUM
Calcium: 11.7 mg/dL — ABNORMAL HIGH (ref 8.6–10.4)
PTH: 72 pg/mL — ABNORMAL HIGH (ref 14–64)

## 2019-11-27 ENCOUNTER — Other Ambulatory Visit: Payer: Self-pay | Admitting: Family Medicine

## 2019-12-02 DIAGNOSIS — R3121 Asymptomatic microscopic hematuria: Secondary | ICD-10-CM | POA: Diagnosis not present

## 2019-12-02 DIAGNOSIS — N3020 Other chronic cystitis without hematuria: Secondary | ICD-10-CM | POA: Diagnosis not present

## 2019-12-05 ENCOUNTER — Encounter: Payer: Self-pay | Admitting: Family Medicine

## 2019-12-11 ENCOUNTER — Other Ambulatory Visit: Payer: Self-pay

## 2019-12-11 ENCOUNTER — Encounter: Payer: Self-pay | Admitting: Family Medicine

## 2019-12-11 ENCOUNTER — Ambulatory Visit (INDEPENDENT_AMBULATORY_CARE_PROVIDER_SITE_OTHER): Payer: HMO | Admitting: Family Medicine

## 2019-12-11 VITALS — BP 122/64 | HR 88 | Temp 98.5°F | Wt 167.2 lb

## 2019-12-11 DIAGNOSIS — M542 Cervicalgia: Secondary | ICD-10-CM

## 2019-12-11 DIAGNOSIS — E119 Type 2 diabetes mellitus without complications: Secondary | ICD-10-CM

## 2019-12-11 NOTE — Patient Instructions (Addendum)
Tylenol 500mg  -take one every 4-6 hours prn neck and scalp pain.  Voltaren gel to neck  If intensity increases or does not resolve, ER for evaluation.  COVID-19 Vaccine Information can be found at: ShippingScam.co.uk For questions related to vaccine distribution or appointments, please email vaccine@[email or web address] .com or call [phone].

## 2019-12-11 NOTE — Progress Notes (Signed)
Established Patient Office Visit  Subjective:  Patient ID: [name], female    DOB: 08-Feb-1935  Age: 85 y.o. MRN: [id number]  CC:  Chief Complaint  Patient presents with  . Neck Pain    HPI [name] presents for neck pain that started upon waking 4 days ago. Pt states she awoke with tightness and pain in the left scalp and neck.  Pain Scale 3/10. FROM. No headache. No fever.  Point tenderness to palpation. Pt states improvement with Voltaren gel and tylenol.  Pt states completely resolved with tylenol but has periodically returned. Pt has not taken additional medication for treatment. No injury  Pt also concerned about hammer toes causing irritation with new shoes-unsure on how to care for feet with foot abnormality  Past Medical History:  Diagnosis Date  . Cancer ([place])   . Diabetes mellitus without complication ([place])   . Hypertension     Past Surgical History:  Procedure Laterality Date  . BREAST SURGERY     Social History   Socioeconomic History  . Marital status: Married    Spouse name: Not on file  . Number of children: Not on file  . Years of education: Not on file  . Highest education level: Not on file  Occupational History  . Occupation: retired  Tobacco Use  . Smoking status: Former Smoker    Packs/day: 1.00    Years: 15.00    Pack years: 15.00    Types: Cigarettes    Start date: 09/14/1955    Quit date: 07/14/1969    Years since quitting: 50.4  . Smokeless tobacco: Never Used  . Tobacco comment: quit 40 years ago  Substance and Sexual Activity  . Alcohol use: Never    Alcohol/week: 0.0 standard drinks  . Drug use: Never  . Sexual activity: Not Currently  Other Topics Concern  . Not on file  Social History Narrative  . Not on file   Social Determinants of Health   Financial Resource Strain:   . Difficulty of Paying Living Expenses: Not on file  Food Insecurity: No Food Insecurity  . Worried About Charity fundraiser in the Last  Year: Never true  . Ran Out of Food in the Last Year: Never true  Transportation Needs: No Transportation Needs  . Lack of Transportation (Medical): No  . Lack of Transportation (Non-Medical): No  Physical Activity:   . Days of Exercise per Week: Not on file  . Minutes of Exercise per Session: Not on file  Stress:   . Feeling of Stress : Not on file  Social Connections:   . Frequency of Communication with Friends and Family: Not on file  . Frequency of Social Gatherings with Friends and Family: Not on file  . Attends Religious Services: Not on file  . Active Member of Clubs or Organizations: Not on file  . Attends Archivist Meetings: Not on file  . Marital Status: Not on file  Intimate Partner Violence:   . Fear of Current or Ex-Partner: Not on file  . Emotionally Abused: Not on file  . Physically Abused: Not on file  . Sexually Abused: Not on file    Outpatient Medications Prior to Visit  Medication Sig Dispense Refill  . B Complex-C (SUPER B COMPLEX) TABS Take 1 tablet by mouth daily.      . Cholecalciferol (VITAMIN D-3) 1000 UNITS CAPS Take 1,000 Units by mouth 2 (two) times daily.     Marland Kitchen  glimepiride (AMARYL) 2 MG tablet TK 1 T PO QD    . losartan (COZAAR) 50 MG tablet Take 50 mg by mouth daily.    . metFORMIN (GLUCOPHAGE) 500 MG tablet Take 1,000 mg by mouth 2 (two) times daily with a meal. 2-- 1000 mg  Tabs in AM and PM    . nitrofurantoin, macrocrystal-monohydrate, (MACROBID) 100 MG capsule Take 1 capsule (100 mg total) by mouth 2 (two) times daily. (Patient taking differently: Take 100 mg by mouth at bedtime. ) 10 capsule 0  . omeprazole (PRILOSEC) 20 MG capsule Take 20 mg by mouth daily.    Vladimir Faster Glycol-Propyl Glycol (SYSTANE OP) Place 1 drop into both eyes daily as needed (dry eyes).    . rosuvastatin (CRESTOR) 10 MG tablet Take 0.5 mg by mouth 2 (two) times a week. Monday and Friday    . amoxicillin (AMOXIL) 250 MG capsule Take 1 capsule (250 mg total) by  mouth 2 (two) times daily. (Patient not taking: Reported on 12/11/2019) 10 capsule 0  . aspirin 81 MG tablet Take 81 mg by mouth daily.      . phenazopyridine (PYRIDIUM) 100 MG tablet Take 1 tablet (100 mg total) by mouth 3 (three) times daily as needed for pain. (Patient not taking: Reported on 12/11/2019) 15 tablet 0  . sodium chloride (MURO 128) 2 % ophthalmic solution Place 1 drop into the left eye daily.     Marland Kitchen sulfamethoxazole-trimethoprim (BACTRIM) 400-80 MG tablet Take 1 tablet by mouth 2 (two) times daily. (Patient not taking: Reported on 12/11/2019) 10 tablet 0   No facility-administered medications prior to visit.    No Known Allergies  ROS Review of Systems  HENT:       Neck pain 3/10-tylenol with no pain Voltaren with improvement in symptoms  Musculoskeletal:       Hammer toe      Objective:    Physical Exam  Constitutional: She appears well-developed and well-nourished. No distress.  HENT:  Head: Normocephalic.  Tenderness to palpation-trapezius insertion-left side.  FROM-flex /extend. LROM-rotation. No mass  Cardiovascular: Normal rate and regular rhythm.  Pulmonary/Chest: Effort normal and breath sounds normal.    BP 122/64 (BP Location: Left Arm, Patient Position: Sitting, Cuff Size: Normal)   Pulse 88   Temp 98.5 F (36.9 C) (Oral)   Wt 167 lb 3.2 oz (75.8 kg)   SpO2 98%   BMI 31.59 kg/m  Wt Readings from Last 3 Encounters:  12/11/19 167 lb 3.2 oz (75.8 kg)  11/10/19 166 lb (75.3 kg)  09/04/19 166 lb 6.4 oz (75.5 kg)    Health Maintenance Due  Topic Date Due  . TETANUS/TDAP  03/31/1956  . INFLUENZA VACCINE  05/31/2019   Lab Results  Component Value Date   TSH 3.96 11/21/2019   Lab Results  Component Value Date   WBC 6.9 03/17/2019   HGB 15.0 03/17/2019   HCT 15 (A) 03/17/2019   MCV 88.1 10/02/2017   PLT 317 03/17/2019   Lab Results  Component Value Date   NA 136 11/06/2019   K 4.7 11/06/2019   CHLORIDE 107 08/12/2015   CO2 27  11/06/2019   GLUCOSE 160 (H) 11/06/2019   BUN 14 11/06/2019   CREATININE 0.89 (H) 11/06/2019   BILITOT 0.6 11/06/2019   ALKPHOS 61 03/17/2019   AST 19 11/06/2019   ALT 26 11/06/2019   PROT 7.1 11/06/2019   ALBUMIN 150 MG/L 07/15/2019   CALCIUM 11.7 (H) 11/21/2019   ANIONGAP 9  10/02/2017   EGFR 70 (L) 08/12/2015   Lab Results  Component Value Date   CHOL 147 11/06/2019   Lab Results  Component Value Date   HDL 31 (L) 11/06/2019   Lab Results  Component Value Date   LDLCALC 86 11/06/2019   Lab Results  Component Value Date   TRIG 207 (H) 11/06/2019   Lab Results  Component Value Date   CHOLHDL 4.7 11/06/2019   Lab Results  Component Value Date   HGBA1C 6.6 (H) 11/06/2019      Assessment & Plan:  1. Diabetes mellitus without complication ([place]) Foot abnormality with irritation when wearing new shoes-need podiatry assessment - Ambulatory referral to Podiatry  2. Neck pain on left side Point tenderness-stretches, voltaren on the neck, tylenol for pain-max 6-54m /day  Follow-up:   Lillyth Spong LHannah Beat MD

## 2019-12-16 ENCOUNTER — Ambulatory Visit (INDEPENDENT_AMBULATORY_CARE_PROVIDER_SITE_OTHER): Payer: HMO | Admitting: "Endocrinology

## 2019-12-16 ENCOUNTER — Encounter: Payer: Self-pay | Admitting: "Endocrinology

## 2019-12-16 ENCOUNTER — Other Ambulatory Visit: Payer: Self-pay

## 2019-12-16 VITALS — BP 126/68 | HR 80 | Ht 61.0 in | Wt 166.8 lb

## 2019-12-16 DIAGNOSIS — E212 Other hyperparathyroidism: Secondary | ICD-10-CM | POA: Insufficient documentation

## 2019-12-16 NOTE — Progress Notes (Signed)
Endocrinology Consult Note       12/16/2019, 5:01 PM  [name] is a 85 y.o.-year-old female, referred by her  Corum, Rex Kras, MD  , for evaluation for hypercalcemia/hyperparathyroidism.   Past Medical History:  Diagnosis Date  . Cancer ([place])   . Diabetes mellitus without complication ([place])   . Hypertension     Past Surgical History:  Procedure Laterality Date  . BREAST SURGERY      Social History   Tobacco Use  . Smoking status: Former Smoker    Packs/day: 1.00    Years: 15.00    Pack years: 15.00    Types: Cigarettes    Start date: 09/14/1955    Quit date: 07/14/1969    Years since quitting: 50.4  . Smokeless tobacco: Never Used  . Tobacco comment: quit 40 years ago  Substance Use Topics  . Alcohol use: Never    Alcohol/week: 0.0 standard drinks  . Drug use: Never    Family History  Problem Relation Age of Onset  . Cancer Mother   . Hypertension Father     Outpatient Encounter Medications as of 12/16/2019  Medication Sig  . B Complex-C (SUPER B COMPLEX) TABS Take 1 tablet by mouth daily.    . Cholecalciferol (VITAMIN D-3) 1000 UNITS CAPS Take 1,000 Units by mouth 2 (two) times daily.   Marland Kitchen glimepiride (AMARYL) 2 MG tablet TK 1 T PO QD  . losartan (COZAAR) 50 MG tablet Take 50 mg by mouth daily.  . metFORMIN (GLUCOPHAGE) 500 MG tablet Take 1,000 mg by mouth 2 (two) times daily with a meal. 2-- 1000 mg  Tabs in AM and PM  . nitrofurantoin, macrocrystal-monohydrate, (MACROBID) 100 MG capsule Take 1 capsule (100 mg total) by mouth 2 (two) times daily. (Patient taking differently: Take 100 mg by mouth at bedtime. )  . omeprazole (PRILOSEC) 20 MG capsule Take 20 mg by mouth daily.  Vladimir Faster Glycol-Propyl Glycol (SYSTANE OP) Place 1 drop into both eyes daily as needed (dry eyes).  . rosuvastatin (CRESTOR) 10 MG tablet Take 0.5 mg by mouth 2 (two) times a week. Monday and Friday  . sodium chloride (MURO  128) 2 % ophthalmic solution Place 1 drop into the left eye daily.   . [DISCONTINUED] amoxicillin (AMOXIL) 250 MG capsule Take 1 capsule (250 mg total) by mouth 2 (two) times daily. (Patient not taking: Reported on 12/11/2019)  . [DISCONTINUED] aspirin 81 MG tablet Take 81 mg by mouth daily.    . [DISCONTINUED] phenazopyridine (PYRIDIUM) 100 MG tablet Take 1 tablet (100 mg total) by mouth 3 (three) times daily as needed for pain. (Patient not taking: Reported on 12/11/2019)  . [DISCONTINUED] sulfamethoxazole-trimethoprim (BACTRIM) 400-80 MG tablet Take 1 tablet by mouth 2 (two) times daily. (Patient not taking: Reported on 12/11/2019)   No facility-administered encounter medications on file as of 12/16/2019.    No Known Allergies   HPI  TERIE SCHOBEL was diagnosed with hypercalcemia in May 2020.  Patient has no previously known history of parathyroid, pituitary, adrenal dysfunctions; no family history of such dysfunctions. -Review of herreferral package of most recent labs reveals calcium of  11.7 the corresponding PTH of 72 on November 21, 2019. She does not have recent bone density to review.-She was recently found to have a 3 mm nonobstructive right sided nephrolithiasis .  No history of CKD.   she is not on HCTZ or other thiazide therapy.  She is on vitamin D supplement.  She is not on calcium supplements. she eats dairy and green, leafy, vegetables on average amounts.  she does not have a family history of hypercalcemia, pituitary tumors, thyroid cancer, or osteoporosis.  -On history, she lost 2 inches of height over the years.  I reviewed her chart and she also has a history of controlled type 2 diabetes on Metformin, recent A1c of 6.6%.    ROS:  Constitutional: no weight gain/loss, no fatigue, no subjective hyperthermia, no subjective hypothermia Eyes: no blurry vision, no xerophthalmia ENT: no sore throat, no nodules palpated in throat, no dysphagia/odynophagia, no  hoarseness Cardiovascular: no Chest Pain, no Shortness of Breath, no palpitations, no leg swelling Respiratory: no cough, no shortness of breath  Gastrointestinal: no Nausea/Vomiting/Diarhhea Musculoskeletal: no muscle/joint aches Skin: no rashes Neurological: no tremors, no numbness, no tingling, no dizziness Psychiatric: no depression, no anxiety  PE: BP 126/68   Pulse 80   Ht 5\' 1"  (1.549 m)   Wt 166 lb 12.8 oz (75.7 kg)   BMI 31.52 kg/m , Body mass index is 31.52 kg/m. Wt Readings from Last 3 Encounters:  12/16/19 166 lb 12.8 oz (75.7 kg)  12/11/19 167 lb 3.2 oz (75.8 kg)  11/10/19 166 lb (75.3 kg)    Constitutional: Her BMI is 31, not in acute distress, normal state of mind Eyes: PERRLA, EOMI, no exophthalmos ENT: moist mucous membranes, no gross thyromegaly, no gross cervical lymphadenopathy Cardiovascular: normal precordial activity, Regular Rate and Rhythm, no Murmur/Rubs/Gallops Respiratory:  adequate breathing efforts, no gross chest deformity, Clear to auscultation bilaterally Gastrointestinal: abdomen soft, Non -tender, No distension, Bowel Sounds present Musculoskeletal: Moderate kyphosis of thoracic spine, no localized bone tenderness, strength intact in all four extremities Skin: moist, warm, no rashes Neurological: no tremor with outstretched hands, Deep tendon reflexes normal in bilateral lower extremities.      CMP     Component Value Date/Time   NA 136 11/06/2019 0836   NA 140 03/12/2018 0000   NA 145 02/15/2017 1333   NA 144 08/12/2015 1419   K 4.7 11/06/2019 0836   K 4.4 02/15/2017 1333   K 4.2 08/12/2015 1419   CL 100 11/06/2019 0836   CL 104 02/15/2017 1333   CO2 27 11/06/2019 0836   CO2 28 02/15/2017 1333   CO2 29 08/12/2015 1419   GLUCOSE 160 (H) 11/06/2019 0836   GLUCOSE 142 (H) 02/15/2017 1333   BUN 14 11/06/2019 0836   BUN 11 03/12/2018 0000   BUN 13 02/15/2017 1333   BUN 11.8 08/12/2015 1419   CREATININE 0.89 (H) 11/06/2019 0836    CREATININE 0.8 08/12/2015 1419   CALCIUM 11.7 (H) 11/21/2019 0918   CALCIUM 10.2 02/15/2017 1333   CALCIUM 11.1 (H) 08/12/2015 1419   PROT 7.1 11/06/2019 0836   PROT 7.5 02/15/2017 1333   PROT 7.2 08/12/2015 1419   ALBUMIN 150 MG/L 07/15/2019 1130   ALBUMIN 3.9 08/12/2015 1419   AST 19 11/06/2019 0836   AST 32 02/15/2017 1333   AST 26 08/12/2015 1419   ALT 26 11/06/2019 0836   ALT 39 02/15/2017 1333   ALT 32 08/12/2015 1419   ALKPHOS 61 03/17/2019 0000  ALKPHOS 76 02/15/2017 1333   ALKPHOS 74 08/12/2015 1419   BILITOT 0.6 11/06/2019 0836   BILITOT 0.90 02/15/2017 1333   BILITOT 0.52 08/12/2015 1419   GFRNONAA 59 (L) 10/02/2017 1855   GFRAA >60 10/02/2017 1855     Diabetic Labs (most recent): Lab Results  Component Value Date   HGBA1C 6.6 (H) 11/06/2019   HGBA1C 6.3 (A) 07/15/2019   HGBA1C 7.2 03/17/2019     Lipid Panel ( most recent) Lipid Panel     Component Value Date/Time   CHOL 147 11/06/2019 0836   TRIG 207 (H) 11/06/2019 0836   HDL 31 (L) 11/06/2019 0836   CHOLHDL 4.7 11/06/2019 0836   LDLCALC 86 11/06/2019 0836      Lab Results  Component Value Date   TSH 3.96 11/21/2019   TSH 4.00 03/17/2019   TSH 4.36 03/12/2018   TSH 2.93 03/12/2017      Assessment: 1. Hypercalcemia / Hyperparathyroidism  Plan: Patient has had several instances of elevated calcium, with the highest level being at 11.8 in May 2020.  Was recently her calcium was found to be elevated at 11.7 associated with high PTH of 72.   - Patient also  has vitamin D deficiency on supplement, most current vitamin D was 39.  -There seems to be an early complication from hypercalcemia/hypocalcemia with 3 mm nonobstructive right thyroid nephrolithiasis.  no history of   osteoporosis,fragility fractures. No abdominal pain, no major mood disorders, no bone pain.  - I discussed with the patient about the physiology of calcium and parathyroid hormone, and possible  effects of  increased PTH/  Calcium , including kidney stones, cardiac dysrhythmias, osteoporosis, abdominal pain, etc.   - The work up so far is not sufficient to reach a conclusion for definitive therapy.  she  needs more studies to confirm and classify the parathyroid dysfunction she may have. I will proceed to obtain  repeat intact PTH/calcium, serum magnesium, serum phosphorus, as well as PTH RP to rule out malignancy related hypercalcemia.   - it will be difficult to collect 24-hour urine sample from this patient.  She is not a surgical candidate even if she is confirmed to have primary hyperparathyroidism.  -She will be considered for Sensipar therapy if she is found to have clinically significant hypercalcemia.    -She will also need DEXA scan to include the distal  33% of  radius for evaluation of cortical bone on her subsequent visits.   - Time spent with the patient: 45 minutes, of which >50% was spent in obtaining information about her symptoms, reviewing her previous labs, evaluations, and treatments, counseling her about her hypercalcemia/hyperparathyroidism, and developing a plan to confirm the diagnosis and long term treatment as necessary.  Please refer to " Patient Self Inventory" in the Media  tab for reviewed elements of pertinent patient history.  Rubie Maid Farver participated in the discussions, expressed understanding, and voiced agreement with the above plans.  All questions were answered to her satisfaction. she is encouraged to contact clinic should she have any questions or concerns prior to her return visit.  - Return in about 1 week (around 12/23/2019), or phone, for Labs Today- Non-Fasting Ok.   Glade Lloyd, MD [facility] [address] [place], [place] [address] Phone: [phone]  Fax: [phone]    This note was partially dictated with voice recognition software. Similar sounding words can be transcribed inadequately or may not  be  corrected upon review.  12/16/2019, 5:01 PM

## 2019-12-23 ENCOUNTER — Ambulatory Visit: Payer: HMO | Admitting: "Endocrinology

## 2019-12-23 LAB — PTH-RELATED PEPTIDE: PTH-Related Protein (PTH-RP): 19 pg/mL (ref 14–27)

## 2019-12-23 LAB — MAGNESIUM: Magnesium: 1.9 mg/dL (ref 1.5–2.5)

## 2019-12-23 LAB — PHOSPHORUS: Phosphorus: 3.1 mg/dL (ref 2.1–4.3)

## 2019-12-24 DIAGNOSIS — M9902 Segmental and somatic dysfunction of thoracic region: Secondary | ICD-10-CM | POA: Diagnosis not present

## 2019-12-24 DIAGNOSIS — S233XXA Sprain of ligaments of thoracic spine, initial encounter: Secondary | ICD-10-CM | POA: Diagnosis not present

## 2019-12-24 DIAGNOSIS — M47812 Spondylosis without myelopathy or radiculopathy, cervical region: Secondary | ICD-10-CM | POA: Diagnosis not present

## 2019-12-24 DIAGNOSIS — M47816 Spondylosis without myelopathy or radiculopathy, lumbar region: Secondary | ICD-10-CM | POA: Diagnosis not present

## 2019-12-24 DIAGNOSIS — M9901 Segmental and somatic dysfunction of cervical region: Secondary | ICD-10-CM | POA: Diagnosis not present

## 2019-12-24 DIAGNOSIS — M436 Torticollis: Secondary | ICD-10-CM | POA: Diagnosis not present

## 2019-12-24 DIAGNOSIS — M9903 Segmental and somatic dysfunction of lumbar region: Secondary | ICD-10-CM | POA: Diagnosis not present

## 2019-12-25 ENCOUNTER — Ambulatory Visit (INDEPENDENT_AMBULATORY_CARE_PROVIDER_SITE_OTHER): Payer: HMO | Admitting: "Endocrinology

## 2019-12-25 ENCOUNTER — Encounter: Payer: Self-pay | Admitting: "Endocrinology

## 2019-12-25 DIAGNOSIS — E212 Other hyperparathyroidism: Secondary | ICD-10-CM

## 2019-12-25 MED ORDER — CINACALCET HCL 30 MG PO TABS: 30.0000 mg | ORAL_TABLET | Freq: Every day | ORAL | 3 refills | Status: DC

## 2019-12-25 NOTE — Progress Notes (Signed)
12/25/2019, 1:08 PM                                 Endocrinology Telehealth Visit Follow up Note -During COVID -19 Pandemic  I connected with [name] on 12/25/2019   by telephone and verified that I am speaking with the correct person using two identifiers. [name], 06-25-1935. she has verbally consented to this visit. All issues noted in this document were discussed and addressed. The format was not optimal for physical exam.  [name] is a 85 y.o.-year-old female, referred by her  Corum, Rex Kras, MD  , for evaluation for hypercalcemia/hyperparathyroidism.   Past Medical History:  Diagnosis Date  . Cancer ([place])   . Diabetes mellitus without complication ([place])   . Hypertension     Past Surgical History:  Procedure Laterality Date  . BREAST SURGERY      Social History   Tobacco Use  . Smoking status: Former Smoker    Packs/day: 1.00    Years: 15.00    Pack years: 15.00    Types: Cigarettes    Start date: 09/14/1955    Quit date: 07/14/1969    Years since quitting: 50.4  . Smokeless tobacco: Never Used  . Tobacco comment: quit 40 years ago  Substance Use Topics  . Alcohol use: Never    Alcohol/week: 0.0 standard drinks  . Drug use: Never    Family History  Problem Relation Age of Onset  . Cancer Mother   . Hypertension Father     Outpatient Encounter Medications as of 12/25/2019  Medication Sig  . B Complex-C (SUPER B COMPLEX) TABS Take 1 tablet by mouth daily.    . Cholecalciferol (VITAMIN D-3) 1000 UNITS CAPS Take 1,000 Units by mouth 2 (two) times daily.   . cinacalcet (SENSIPAR) 30 MG tablet Take 1 tablet (30 mg total) by mouth daily with breakfast.  . glimepiride (AMARYL) 2 MG tablet TK 1 T PO QD  . losartan (COZAAR) 50 MG tablet Take 50 mg by mouth daily.  . metFORMIN (GLUCOPHAGE) 500 MG tablet Take 1,000 mg by mouth 2 (two) times daily with a meal. 2-- 1000 mg  Tabs in AM  and PM  . nitrofurantoin, macrocrystal-monohydrate, (MACROBID) 100 MG capsule Take 1 capsule (100 mg total) by mouth 2 (two) times daily. (Patient taking differently: Take 100 mg by mouth at bedtime. )  . omeprazole (PRILOSEC) 20 MG capsule Take 20 mg by mouth daily.  Vladimir Faster Glycol-Propyl Glycol (SYSTANE OP) Place 1 drop into both eyes daily as needed (dry eyes).  . rosuvastatin (CRESTOR) 10 MG tablet Take 0.5 mg by mouth 2 (two) times a week. Monday and Friday  . sodium chloride (MURO 128) 2 % ophthalmic solution Place 1 drop into the left eye daily.    No facility-administered encounter medications on file as of 12/25/2019.    No Known Allergies   HPI  [name] was diagnosed with hypercalcemia in May 2020.  Patient has no previously known history of parathyroid, pituitary, adrenal  dysfunctions; no family history of such dysfunctions. -Review of herreferral package of most recent labs reveals calcium of 11.7 the corresponding PTH of 72 on November 21, 2019. Her subsequent labs show PTH RP normal at 19, magnesium 1.9, phosphorus 3.1.  It was not convenient to perform 24-hour urine study in her.  She does not have recent bone density to review. -She was recently found to have a 3 mm nonobstructive right sided nephrolithiasis.   No history of CKD.   she is not on HCTZ or other thiazide therapy.  She is on vitamin D supplement.  She is not on calcium supplements. she eats dairy and green, leafy, vegetables on average amounts.  she does not have a family history of hypercalcemia, pituitary tumors, thyroid cancer, or osteoporosis.  -On history, she lost 2 inches of height over the years.  I reviewed her chart and she also has a history of controlled type 2 diabetes on Metformin, recent A1c of 6.6%.    ROS: Limited as above.  PE: There were no vitals taken for this visit., There is no height or weight on file to calculate BMI. Wt Readings from Last 3 Encounters:  12/16/19  166 lb 12.8 oz (75.7 kg)  12/11/19 167 lb 3.2 oz (75.8 kg)  11/10/19 166 lb (75.3 kg)       CMP     Component Value Date/Time   NA 136 11/06/2019 0836   NA 140 03/12/2018 0000   NA 145 02/15/2017 1333   NA 144 08/12/2015 1419   K 4.7 11/06/2019 0836   K 4.4 02/15/2017 1333   K 4.2 08/12/2015 1419   CL 100 11/06/2019 0836   CL 104 02/15/2017 1333   CO2 27 11/06/2019 0836   CO2 28 02/15/2017 1333   CO2 29 08/12/2015 1419   GLUCOSE 160 (H) 11/06/2019 0836   GLUCOSE 142 (H) 02/15/2017 1333   BUN 14 11/06/2019 0836   BUN 11 03/12/2018 0000   BUN 13 02/15/2017 1333   BUN 11.8 08/12/2015 1419   CREATININE 0.89 (H) 11/06/2019 0836   CREATININE 0.8 08/12/2015 1419   CALCIUM 11.7 (H) 11/21/2019 0918   CALCIUM 10.2 02/15/2017 1333   CALCIUM 11.1 (H) 08/12/2015 1419   PROT 7.1 11/06/2019 0836   PROT 7.5 02/15/2017 1333   PROT 7.2 08/12/2015 1419   ALBUMIN 150 MG/L 07/15/2019 1130   ALBUMIN 3.9 08/12/2015 1419   AST 19 11/06/2019 0836   AST 32 02/15/2017 1333   AST 26 08/12/2015 1419   ALT 26 11/06/2019 0836   ALT 39 02/15/2017 1333   ALT 32 08/12/2015 1419   ALKPHOS 61 03/17/2019 0000   ALKPHOS 76 02/15/2017 1333   ALKPHOS 74 08/12/2015 1419   BILITOT 0.6 11/06/2019 0836   BILITOT 0.90 02/15/2017 1333   BILITOT 0.52 08/12/2015 1419   GFRNONAA 59 (L) 10/02/2017 1855   GFRAA >60 10/02/2017 1855     Diabetic Labs (most recent): Lab Results  Component Value Date   HGBA1C 6.6 (H) 11/06/2019   HGBA1C 6.3 (A) 07/15/2019   HGBA1C 7.2 03/17/2019     Lipid Panel ( most recent) Lipid Panel     Component Value Date/Time   CHOL 147 11/06/2019 0836   TRIG 207 (H) 11/06/2019 0836   HDL 31 (L) 11/06/2019 0836   CHOLHDL 4.7 11/06/2019 0836   LDLCALC 86 11/06/2019 0836      Lab Results  Component Value Date   TSH 3.96 11/21/2019   TSH 4.00 03/17/2019  TSH 4.36 03/12/2018   TSH 2.93 03/12/2017      Assessment: 1. Hypercalcemia /  Hyperparathyroidism  Plan: Patient has had several instances of elevated calcium, with the highest level being at 11.8 in May 2020.  Was recently her calcium was found to be elevated at 11.7 associated with high PTH of 72.   - Patient also  has vitamin D deficiency on supplement, most current vitamin D was 39. -PTH RP is normal, malignancy related hypercalcemia unlikely. -There seems to be an early complication from hypercalcemia/hypocalcemia with 3 mm nonobstructive right thyroid nephrolithiasis.  no history of   osteoporosis,fragility fractures. No abdominal pain, no major mood disorders, no bone pain.  - I discussed with the patient about the physiology of calcium and parathyroid hormone, and possible  effects of  increased PTH/ Calcium , including kidney stones, cardiac dysrhythmias, osteoporosis, abdominal pain, etc.   - it will be difficult to collect 24-hour urine sample from this patient.  She is not a surgical candidate even if she is confirmed to have primary hyperparathyroidism.  -She will benefit from low-dose Sensipar.  I discussed and prescribed Sensipar 30 mg p.o. daily with plan to repeat labs in 3 months with office visit.   -She will also need DEXA scan to include the distal  33% of  radius for evaluation of cortical bone on her subsequent visits.      - Time spent on this patient care encounter:  25 minutes of which 50% was spent in  counseling and the rest reviewing  her current and  previous labs / studies and medications  doses and developing a plan for long term care. [name]  participated in the discussions, expressed understanding, and voiced agreement with the above plans.  All questions were answered to her satisfaction. she is encouraged to contact clinic should she have any questions or concerns prior to her return visit.   - Return in about 3 months (around 03/23/2020) for Follow up with Pre-visit Labs.   Glade Lloyd, MD [facility]  Group [facility] [address] [place], [place] [address] Phone: [phone]  Fax: [phone]    This note was partially dictated with voice recognition software. Similar sounding words can be transcribed inadequately or may not  be corrected upon review.  12/25/2019, 1:08 PM

## 2019-12-26 DIAGNOSIS — M9901 Segmental and somatic dysfunction of cervical region: Secondary | ICD-10-CM | POA: Diagnosis not present

## 2019-12-26 DIAGNOSIS — M47816 Spondylosis without myelopathy or radiculopathy, lumbar region: Secondary | ICD-10-CM | POA: Diagnosis not present

## 2019-12-26 DIAGNOSIS — M47812 Spondylosis without myelopathy or radiculopathy, cervical region: Secondary | ICD-10-CM | POA: Diagnosis not present

## 2019-12-26 DIAGNOSIS — S233XXA Sprain of ligaments of thoracic spine, initial encounter: Secondary | ICD-10-CM | POA: Diagnosis not present

## 2019-12-26 DIAGNOSIS — M436 Torticollis: Secondary | ICD-10-CM | POA: Diagnosis not present

## 2019-12-26 DIAGNOSIS — M9902 Segmental and somatic dysfunction of thoracic region: Secondary | ICD-10-CM | POA: Diagnosis not present

## 2019-12-26 DIAGNOSIS — M9903 Segmental and somatic dysfunction of lumbar region: Secondary | ICD-10-CM | POA: Diagnosis not present

## 2019-12-29 ENCOUNTER — Telehealth: Payer: Self-pay | Admitting: "Endocrinology

## 2019-12-29 NOTE — Telephone Encounter (Signed)
Patient said that cinacalcet (SENSIPAR) 30 MG tablet was going to be $800.00, is there a cheaper medication she can have called in?

## 2019-12-30 ENCOUNTER — Telehealth: Payer: Self-pay

## 2019-12-30 ENCOUNTER — Other Ambulatory Visit: Payer: Self-pay | Admitting: "Endocrinology

## 2019-12-30 DIAGNOSIS — M9901 Segmental and somatic dysfunction of cervical region: Secondary | ICD-10-CM | POA: Diagnosis not present

## 2019-12-30 DIAGNOSIS — M436 Torticollis: Secondary | ICD-10-CM | POA: Diagnosis not present

## 2019-12-30 DIAGNOSIS — M9903 Segmental and somatic dysfunction of lumbar region: Secondary | ICD-10-CM | POA: Diagnosis not present

## 2019-12-30 DIAGNOSIS — M47816 Spondylosis without myelopathy or radiculopathy, lumbar region: Secondary | ICD-10-CM | POA: Diagnosis not present

## 2019-12-30 DIAGNOSIS — S233XXA Sprain of ligaments of thoracic spine, initial encounter: Secondary | ICD-10-CM | POA: Diagnosis not present

## 2019-12-30 DIAGNOSIS — M47812 Spondylosis without myelopathy or radiculopathy, cervical region: Secondary | ICD-10-CM | POA: Diagnosis not present

## 2019-12-30 DIAGNOSIS — M9902 Segmental and somatic dysfunction of thoracic region: Secondary | ICD-10-CM | POA: Diagnosis not present

## 2019-12-30 MED ORDER — CINACALCET HCL 30 MG PO TABS: 30.0000 mg | ORAL_TABLET | Freq: Every day | ORAL | 3 refills | Status: DC

## 2019-12-30 NOTE — Telephone Encounter (Signed)
Tried to call pt but did not receive an answer.

## 2019-12-30 NOTE — Telephone Encounter (Signed)
Pt is calling to see if there was ever a decision on the thyroid medication

## 2019-12-30 NOTE — Telephone Encounter (Signed)
Thank you :)

## 2019-12-30 NOTE — Telephone Encounter (Signed)
Cinacalcet is generic for Sensipar and should be significantly cheaper than Sensipar. I will send a rx again.

## 2019-12-30 NOTE — Telephone Encounter (Signed)
Tried to reach pt. We did receive a form for a prior authorization, kim will work on that tomorrow, Wednesday.

## 2019-12-31 DIAGNOSIS — N3020 Other chronic cystitis without hematuria: Secondary | ICD-10-CM | POA: Diagnosis not present

## 2019-12-31 NOTE — Telephone Encounter (Signed)
She cant afford the 800.00 Thyroid medication

## 2019-12-31 NOTE — Telephone Encounter (Signed)
Medication approved and rx sent to pharmacy

## 2019-12-31 NOTE — Telephone Encounter (Signed)
Tried to call pt but did not receive an answer.

## 2020-01-06 NOTE — Telephone Encounter (Signed)
Pt said she started the medication 5 days ago and the last 3 nights she has had cramps in legs/feet. She said she is not taking it anymore.

## 2020-01-07 NOTE — Telephone Encounter (Signed)
Muscle spasm is a temporary side effect of sensipar. Since this is the only medication option she has other than surgery, I suggest she restart it by taking it every other day with enough water.

## 2020-01-08 NOTE — Telephone Encounter (Signed)
Tried to call pt but did not receive an answer. 

## 2020-01-08 NOTE — Telephone Encounter (Signed)
Discussed with pt, understanding voiced. 

## 2020-01-09 DIAGNOSIS — H903 Sensorineural hearing loss, bilateral: Secondary | ICD-10-CM | POA: Diagnosis not present

## 2020-01-27 DIAGNOSIS — M47816 Spondylosis without myelopathy or radiculopathy, lumbar region: Secondary | ICD-10-CM | POA: Diagnosis not present

## 2020-01-27 DIAGNOSIS — M436 Torticollis: Secondary | ICD-10-CM | POA: Diagnosis not present

## 2020-01-27 DIAGNOSIS — M47812 Spondylosis without myelopathy or radiculopathy, cervical region: Secondary | ICD-10-CM | POA: Diagnosis not present

## 2020-01-27 DIAGNOSIS — M9902 Segmental and somatic dysfunction of thoracic region: Secondary | ICD-10-CM | POA: Diagnosis not present

## 2020-01-27 DIAGNOSIS — M9901 Segmental and somatic dysfunction of cervical region: Secondary | ICD-10-CM | POA: Diagnosis not present

## 2020-01-27 DIAGNOSIS — S233XXA Sprain of ligaments of thoracic spine, initial encounter: Secondary | ICD-10-CM | POA: Diagnosis not present

## 2020-01-27 DIAGNOSIS — M9903 Segmental and somatic dysfunction of lumbar region: Secondary | ICD-10-CM | POA: Diagnosis not present

## 2020-01-28 DIAGNOSIS — M9901 Segmental and somatic dysfunction of cervical region: Secondary | ICD-10-CM | POA: Diagnosis not present

## 2020-01-28 DIAGNOSIS — M9903 Segmental and somatic dysfunction of lumbar region: Secondary | ICD-10-CM | POA: Diagnosis not present

## 2020-01-28 DIAGNOSIS — M47816 Spondylosis without myelopathy or radiculopathy, lumbar region: Secondary | ICD-10-CM | POA: Diagnosis not present

## 2020-01-28 DIAGNOSIS — M9902 Segmental and somatic dysfunction of thoracic region: Secondary | ICD-10-CM | POA: Diagnosis not present

## 2020-01-28 DIAGNOSIS — M436 Torticollis: Secondary | ICD-10-CM | POA: Diagnosis not present

## 2020-01-28 DIAGNOSIS — S233XXA Sprain of ligaments of thoracic spine, initial encounter: Secondary | ICD-10-CM | POA: Diagnosis not present

## 2020-01-28 DIAGNOSIS — M47812 Spondylosis without myelopathy or radiculopathy, cervical region: Secondary | ICD-10-CM | POA: Diagnosis not present

## 2020-02-18 DIAGNOSIS — L821 Other seborrheic keratosis: Secondary | ICD-10-CM | POA: Diagnosis not present

## 2020-02-18 DIAGNOSIS — Z85820 Personal history of malignant melanoma of skin: Secondary | ICD-10-CM | POA: Diagnosis not present

## 2020-02-18 DIAGNOSIS — L570 Actinic keratosis: Secondary | ICD-10-CM | POA: Diagnosis not present

## 2020-02-23 DIAGNOSIS — M47816 Spondylosis without myelopathy or radiculopathy, lumbar region: Secondary | ICD-10-CM | POA: Diagnosis not present

## 2020-02-23 DIAGNOSIS — M9901 Segmental and somatic dysfunction of cervical region: Secondary | ICD-10-CM | POA: Diagnosis not present

## 2020-02-23 DIAGNOSIS — M9902 Segmental and somatic dysfunction of thoracic region: Secondary | ICD-10-CM | POA: Diagnosis not present

## 2020-02-23 DIAGNOSIS — M9903 Segmental and somatic dysfunction of lumbar region: Secondary | ICD-10-CM | POA: Diagnosis not present

## 2020-02-23 DIAGNOSIS — M47812 Spondylosis without myelopathy or radiculopathy, cervical region: Secondary | ICD-10-CM | POA: Diagnosis not present

## 2020-02-23 DIAGNOSIS — S233XXA Sprain of ligaments of thoracic spine, initial encounter: Secondary | ICD-10-CM | POA: Diagnosis not present

## 2020-02-23 DIAGNOSIS — M436 Torticollis: Secondary | ICD-10-CM | POA: Diagnosis not present

## 2020-03-05 ENCOUNTER — Other Ambulatory Visit: Payer: Self-pay

## 2020-03-08 ENCOUNTER — Ambulatory Visit (INDEPENDENT_AMBULATORY_CARE_PROVIDER_SITE_OTHER): Payer: HMO | Admitting: Family Medicine

## 2020-03-08 ENCOUNTER — Encounter: Payer: Self-pay | Admitting: Family Medicine

## 2020-03-08 ENCOUNTER — Other Ambulatory Visit: Payer: Self-pay

## 2020-03-08 VITALS — BP 128/70 | HR 71 | Temp 97.2°F | Ht 61.0 in | Wt 169.5 lb

## 2020-03-08 DIAGNOSIS — M2041 Other hammer toe(s) (acquired), right foot: Secondary | ICD-10-CM | POA: Diagnosis not present

## 2020-03-08 DIAGNOSIS — E212 Other hyperparathyroidism: Secondary | ICD-10-CM | POA: Diagnosis not present

## 2020-03-08 DIAGNOSIS — C50011 Malignant neoplasm of nipple and areola, right female breast: Secondary | ICD-10-CM | POA: Diagnosis not present

## 2020-03-08 DIAGNOSIS — C50012 Malignant neoplasm of nipple and areola, left female breast: Secondary | ICD-10-CM

## 2020-03-08 DIAGNOSIS — Z170 Estrogen receptor positive status [ER+]: Secondary | ICD-10-CM

## 2020-03-08 DIAGNOSIS — M2042 Other hammer toe(s) (acquired), left foot: Secondary | ICD-10-CM | POA: Diagnosis not present

## 2020-03-08 DIAGNOSIS — E119 Type 2 diabetes mellitus without complications: Secondary | ICD-10-CM | POA: Diagnosis not present

## 2020-03-08 DIAGNOSIS — I10 Essential (primary) hypertension: Secondary | ICD-10-CM

## 2020-03-08 DIAGNOSIS — K219 Gastro-esophageal reflux disease without esophagitis: Secondary | ICD-10-CM

## 2020-03-08 DIAGNOSIS — Z85820 Personal history of malignant melanoma of skin: Secondary | ICD-10-CM | POA: Insufficient documentation

## 2020-03-08 DIAGNOSIS — E785 Hyperlipidemia, unspecified: Secondary | ICD-10-CM

## 2020-03-08 DIAGNOSIS — E559 Vitamin D deficiency, unspecified: Secondary | ICD-10-CM

## 2020-03-08 NOTE — Patient Instructions (Signed)
It was very nice to see you today!  Please take your Metformin once per day.  I will place a referral for you to see a specialist for your feet.  Come back in 3 months, or sooner if needed.  Take care, Dr Jerline Pain  Please try these tips to maintain a healthy lifestyle:   Eat at least 3 REAL meals and 1-2 snacks per day.  Aim for no more than 5 hours between eating.  If you eat breakfast, please do so within one hour of getting up.    Each meal should contain half fruits/vegetables, one quarter protein, and one quarter carbs (no bigger than a computer mouse)   Cut down on sweet beverages. This includes juice, soda, and sweet tea.     Drink at least 1 glass of water with each meal and aim for at least 8 glasses per day   Exercise at least 150 minutes every week.

## 2020-03-08 NOTE — Assessment & Plan Note (Signed)
She is having some diarrhea likely secondary to Metformin.  Will decrease dose to 1000 mg daily.  Also continue Amaryl 2 mg daily.  She will follow-up in 3 months to recheck A1c.

## 2020-03-08 NOTE — Assessment & Plan Note (Signed)
Continue management per dermatology.  

## 2020-03-08 NOTE — Assessment & Plan Note (Signed)
At goal.  Continue losartan 50 mg daily. 

## 2020-03-08 NOTE — Assessment & Plan Note (Signed)
Stable

## 2020-03-08 NOTE — Progress Notes (Signed)
   [name] is a 85 y.o. female who presents today for an .office visit.  She is a new patient.   Assessment/Plan:  New/Acute Problems: Hammertoe deformity Will place referral to sports med.  Chronic Problems Addressed Today: History of melanoma Continue management per dermatology.   Gastroesophageal reflux disease without esophagitis Continue omeprazole 20 mg daily.  Hyperlipidemia Continue Crestor 5 mg 3 times weekly.  Vitamin D deficiency Continue vitamin D replacement.  Essential hypertension At goal.  Continue losartan 50 mg daily.  Diabetes mellitus without complication ([place]) She is having some diarrhea likely secondary to Metformin.  Will decrease dose to 1000 mg daily.  Also continue Amaryl 2 mg daily.  She will follow-up in 3 months to recheck A1c.  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  Other hyperparathyroidism ([place]) Stable.  Continue management per endocrinology.     Subjective:  HPI:  She has had worsening bilateral toe pain due to hammertoe deformity for the past several months.  Her stable, chronic medical conditions are outlined below:   # T2DM - On metformin 1000mg  twice daily and amaryl 2mg  daily  # Essential Hypertension - On losartan 50mg  daily  # Dyslipidemia - On crestor 5mg  three times weekly  # GERD - On prilosec 20mg  daily  # Hyperparathyroidism - Follows with endocrinology - On sensipar 30mg  every other day  # History of Breast Cancer - Follows with Dr Oncology  # History of Melanoma - Follows with dermatology yearly       Objective:  Physical Exam: BP 128/70   Pulse 71   Temp (!) 97.2 F (36.2 C)   Ht 5\' 1"  (1.549 m)   Wt 169 lb 8 oz (76.9 kg)   SpO2 98%   BMI 32.03 kg/m   Gen: No acute distress, resting comfortably CV: Regular rate and rhythm with no murmurs appreciated Pulm: Normal work of breathing, clear to auscultation bilaterally with no  crackles, wheezes, or rhonchi Neuro: Grossly normal, moves all extremities MSK: Bilateral feet with hammertoe deformities. Psych: Normal affect and thought content      Torres Hardenbrook M. Jerline Pain, MD 03/08/2020 10:17 AM

## 2020-03-08 NOTE — Assessment & Plan Note (Signed)
-  Continue omeprazole 20 mg daily

## 2020-03-08 NOTE — Assessment & Plan Note (Signed)
Stable.  Continue management per endocrinology. 

## 2020-03-08 NOTE — Assessment & Plan Note (Signed)
Continue Crestor 5 mg 3 times weekly. 

## 2020-03-08 NOTE — Assessment & Plan Note (Signed)
Continue vitamin D replacement.  

## 2020-03-22 DIAGNOSIS — M9902 Segmental and somatic dysfunction of thoracic region: Secondary | ICD-10-CM | POA: Diagnosis not present

## 2020-03-22 DIAGNOSIS — M436 Torticollis: Secondary | ICD-10-CM | POA: Diagnosis not present

## 2020-03-22 DIAGNOSIS — M47812 Spondylosis without myelopathy or radiculopathy, cervical region: Secondary | ICD-10-CM | POA: Diagnosis not present

## 2020-03-22 DIAGNOSIS — M9903 Segmental and somatic dysfunction of lumbar region: Secondary | ICD-10-CM | POA: Diagnosis not present

## 2020-03-22 DIAGNOSIS — M47816 Spondylosis without myelopathy or radiculopathy, lumbar region: Secondary | ICD-10-CM | POA: Diagnosis not present

## 2020-03-22 DIAGNOSIS — S233XXA Sprain of ligaments of thoracic spine, initial encounter: Secondary | ICD-10-CM | POA: Diagnosis not present

## 2020-03-22 DIAGNOSIS — M9901 Segmental and somatic dysfunction of cervical region: Secondary | ICD-10-CM | POA: Diagnosis not present

## 2020-03-24 ENCOUNTER — Ambulatory Visit: Payer: HMO | Admitting: "Endocrinology

## 2020-03-24 DIAGNOSIS — E212 Other hyperparathyroidism: Secondary | ICD-10-CM | POA: Diagnosis not present

## 2020-03-25 LAB — COMPLETE METABOLIC PANEL WITH GFR
AG Ratio: 1.5 (calc) (ref 1.0–2.5)
ALT: 21 U/L (ref 6–29)
AST: 17 U/L (ref 10–35)
Albumin: 4.1 g/dL (ref 3.6–5.1)
Alkaline phosphatase (APISO): 73 U/L (ref 37–153)
BUN: 11 mg/dL (ref 7–25)
CO2: 28 mmol/L (ref 20–32)
Calcium: 10.2 mg/dL (ref 8.6–10.4)
Chloride: 102 mmol/L (ref 98–110)
Creat: 0.82 mg/dL (ref 0.60–0.88)
GFR, Est African American: 77 mL/min/{1.73_m2} (ref >=60)
GFR, Est Non African American: 67 mL/min/{1.73_m2} (ref >=60)
Globulin: 2.8 g/dL (calc) (ref 1.9–3.7)
Glucose, Bld: 217 mg/dL — ABNORMAL HIGH (ref 65–139)
Potassium: 4.2 mmol/L (ref 3.5–5.3)
Sodium: 137 mmol/L (ref 135–146)
Total Bilirubin: 0.6 mg/dL (ref 0.2–1.2)
Total Protein: 6.9 g/dL (ref 6.1–8.1)

## 2020-03-25 LAB — PTH, INTACT AND CALCIUM
Calcium: 10.2 mg/dL (ref 8.6–10.4)
PTH: 72 pg/mL — ABNORMAL HIGH (ref 14–64)

## 2020-03-26 DIAGNOSIS — Z1231 Encounter for screening mammogram for malignant neoplasm of breast: Secondary | ICD-10-CM | POA: Diagnosis not present

## 2020-03-26 LAB — HM MAMMOGRAPHY

## 2020-03-30 ENCOUNTER — Other Ambulatory Visit: Payer: Self-pay

## 2020-03-30 ENCOUNTER — Encounter: Payer: Self-pay | Admitting: "Endocrinology

## 2020-03-30 ENCOUNTER — Ambulatory Visit: Payer: HMO | Admitting: "Endocrinology

## 2020-03-30 VITALS — BP 133/74 | HR 71 | Ht 61.0 in | Wt 169.0 lb

## 2020-03-30 DIAGNOSIS — E212 Other hyperparathyroidism: Secondary | ICD-10-CM | POA: Diagnosis not present

## 2020-03-30 MED ORDER — CINACALCET HCL 30 MG PO TABS: 30.0000 mg | ORAL_TABLET | ORAL | 1 refills | Status: DC

## 2020-03-30 NOTE — Progress Notes (Signed)
03/30/2020, 1:18 PM       Endocrinology follow-up note   [name] is a 85 y.o.-year-old female, referred by her  [name], MD  , for evaluation for hypercalcemia/hyperparathyroidism.   Past Medical History:  Diagnosis Date  . Arthritis   . Cancer ([place])   . Diabetes mellitus without complication ([place])   . GERD (gastroesophageal reflux disease)   . Hyperlipidemia   . Hypertension   . Thyroid disease     Past Surgical History:  Procedure Laterality Date  . BREAST SURGERY    . MELANOMA EXCISION    . TONSILLECTOMY      Social History   Tobacco Use  . Smoking status: Former Smoker    Packs/day: 1.00    Years: 15.00    Pack years: 15.00    Types: Cigarettes    Start date: 09/14/1955    Quit date: 07/14/1969    Years since quitting: 50.7  . Smokeless tobacco: Never Used  . Tobacco comment: quit 40 years ago  Substance Use Topics  . Alcohol use: Never    Alcohol/week: 0.0 standard drinks  . Drug use: Never    Family History  Problem Relation Age of Onset  . Cancer Mother   . Hypertension Father     Outpatient Encounter Medications as of 03/30/2020  Medication Sig  . B Complex-C (SUPER B COMPLEX) TABS Take 1 tablet by mouth daily.    . Cholecalciferol (VITAMIN D-3) 1000 UNITS CAPS Take 1,000 Units by mouth 2 (two) times daily.   . cinacalcet (SENSIPAR) 30 MG tablet Take 1 tablet (30 mg total) by mouth every other day. 1 tablet every other day  . glimepiride (AMARYL) 2 MG tablet TK 1 T PO QD  . losartan (COZAAR) 50 MG tablet Take 50 mg by mouth daily.  . metFORMIN (GLUCOPHAGE) 500 MG tablet Take 1,000 mg by mouth 2 (two) times daily with a meal.   . omeprazole (PRILOSEC) 20 MG capsule Take 20 mg by mouth daily.  Vladimir Faster Glycol-Propyl Glycol (SYSTANE OP) Place 1 drop into both eyes daily as needed (dry eyes).  . rosuvastatin (CRESTOR) 10 MG tablet Take 0.5 mg by mouth 3 (three) times a  week. Monday and Friday  . [DISCONTINUED] cinacalcet (SENSIPAR) 30 MG tablet Take 1 tablet (30 mg total) by mouth daily with breakfast. (Patient taking differently: Take 30 mg by mouth daily with breakfast. 1 tablet every other day)   No facility-administered encounter medications on file as of 03/30/2020.    No Known Allergies   HPI  [name] was diagnosed with hypercalcemia in May 2020.   She was recently diagnosed with hypercalcemia related to early mild primary hyperparathyroidism.  She was started on low-dose Sensipar because she was not a surgical candidate.  She took Sensipar 30 mg p.o. every other day.  Previsit labs show improvement in her calcium to 10.2 from 11.7.  It was not convenient to perform 24-hour urine study in her.  She does not have recent bone density to review-an order is in place for her. -She was recently found to have a  3 mm nonobstructive right sided nephrolithiasis.   No history of CKD.   she is not on HCTZ or other thiazide therapy.  She is on vitamin D supplement.  She is not on calcium supplements. she eats dairy and green, leafy, vegetables on average amounts.  she does not have a family history of hypercalcemia, pituitary tumors, thyroid cancer, or osteoporosis.  -On history, she lost 2 inches of height over the years.  I reviewed her chart and she also has a history of controlled type 2 diabetes on Metformin, recent A1c of 6.6%.    ROS: Limited as above.  PE: BP 133/74   Pulse 71   Ht 5\' 1"  (1.549 m)   Wt 169 lb (76.7 kg)   BMI 31.93 kg/m , Body mass index is 31.93 kg/m. Wt Readings from Last 3 Encounters:  03/30/20 169 lb (76.7 kg)  03/08/20 169 lb 8 oz (76.9 kg)  12/16/19 166 lb 12.8 oz (75.7 kg)       CMP     Component Value Date/Time   NA 137 03/24/2020 1122   NA 140 03/12/2018 0000   NA 145 02/15/2017 1333   NA 144 08/12/2015 1419   K 4.2 03/24/2020 1122   K 4.4 02/15/2017 1333   K 4.2 08/12/2015 1419   CL 102  03/24/2020 1122   CL 104 02/15/2017 1333   CO2 28 03/24/2020 1122   CO2 28 02/15/2017 1333   CO2 29 08/12/2015 1419   GLUCOSE 217 (H) 03/24/2020 1122   GLUCOSE 142 (H) 02/15/2017 1333   BUN 11 03/24/2020 1122   BUN 11 03/12/2018 0000   BUN 13 02/15/2017 1333   BUN 11.8 08/12/2015 1419   CREATININE 0.82 03/24/2020 1122   CREATININE 0.8 08/12/2015 1419   CALCIUM 10.2 03/24/2020 1122   CALCIUM 10.2 03/24/2020 1122   CALCIUM 10.2 02/15/2017 1333   CALCIUM 11.1 (H) 08/12/2015 1419   PROT 6.9 03/24/2020 1122   PROT 7.5 02/15/2017 1333   PROT 7.2 08/12/2015 1419   ALBUMIN 150 MG/L 07/15/2019 1130   ALBUMIN 3.9 08/12/2015 1419   AST 17 03/24/2020 1122   AST 32 02/15/2017 1333   AST 26 08/12/2015 1419   ALT 21 03/24/2020 1122   ALT 39 02/15/2017 1333   ALT 32 08/12/2015 1419   ALKPHOS 61 03/17/2019 0000   ALKPHOS 76 02/15/2017 1333   ALKPHOS 74 08/12/2015 1419   BILITOT 0.6 03/24/2020 1122   BILITOT 0.90 02/15/2017 1333   BILITOT 0.52 08/12/2015 1419   GFRNONAA 67 03/24/2020 1122   GFRAA 77 03/24/2020 1122     Diabetic Labs (most recent): Lab Results  Component Value Date   HGBA1C 6.6 (H) 11/06/2019   HGBA1C 6.3 (A) 07/15/2019   HGBA1C 7.2 03/17/2019     Lipid Panel ( most recent) Lipid Panel     Component Value Date/Time   CHOL 147 11/06/2019 0836   TRIG 207 (H) 11/06/2019 0836   HDL 31 (L) 11/06/2019 0836   CHOLHDL 4.7 11/06/2019 0836   LDLCALC 86 11/06/2019 0836      Lab Results  Component Value Date   TSH 3.96 11/21/2019   TSH 4.00 03/17/2019   TSH 4.36 03/12/2018   TSH 2.93 03/12/2017      Assessment: 1. Hypercalcemia / Hyperparathyroidism  Plan: Patient has had several instances of elevated calcium, with the highest level being at 11.8 in May 2020.  After she was started on Sensipar, her calcium stabilized at 10.2.  Previsit labs  show high PTH of 72.   - Patient also  has vitamin D deficiency on supplement, most current vitamin D was 39. -PTH RP  is normal, malignancy related hypercalcemia unlikely. -There seems to be an early complication from hypercalcemia/hypocalcemia with 3 mm nonobstructive right thyroid nephrolithiasis.  no history of   osteoporosis,fragility fractures. No abdominal pain, no major mood disorders, no bone pain-she is encouraged to obtain her bone density.  - I discussed with the patient about the physiology of calcium and parathyroid hormone, and possible  effects of  increased PTH/ Calcium , including kidney stones, cardiac dysrhythmias, osteoporosis, abdominal pain, etc.   -She will continue to benefit from low-dose Sensipar therapy.  I discussed and readjusted her Sensipar 30 mg p.o. every other day with breakfast.    -She will also need DEXA scan to include the distal  33% of  radius for evaluation of cortical bone on her subsequent visits.  She is advised to maintain close follow-up with her PCP.    - Time spent on this patient care encounter:  20 minutes of which 50% was spent in  counseling and the rest reviewing  her current and  previous labs / studies and medications  doses and developing a plan for long term care. Rubie Maid Akram  participated in the discussions, expressed understanding, and voiced agreement with the above plans.  All questions were answered to her satisfaction. she is encouraged to contact clinic should she have any questions or concerns prior to her return visit.    - Return in about 4 months (around 07/30/2020) for F/U with Pre-visit Labs.   Glade Lloyd, MD [facility] [address] [place],  [address] Phone: [phone]  Fax: [phone]    This note was partially dictated with voice recognition software. Similar sounding words can be transcribed inadequately or may not  be corrected upon review.  03/30/2020, 1:18 PM

## 2020-03-31 ENCOUNTER — Encounter: Payer: Self-pay | Admitting: Family Medicine

## 2020-04-05 ENCOUNTER — Ambulatory Visit: Payer: Self-pay

## 2020-04-17 ENCOUNTER — Other Ambulatory Visit: Payer: Self-pay | Admitting: Family Medicine

## 2020-04-17 DIAGNOSIS — M810 Age-related osteoporosis without current pathological fracture: Secondary | ICD-10-CM

## 2020-04-17 DIAGNOSIS — C50919 Malignant neoplasm of unspecified site of unspecified female breast: Secondary | ICD-10-CM

## 2020-04-19 NOTE — Telephone Encounter (Signed)
Pt called following up on prescription refill. Please advise.   Pharm[facility]:   [facility] in [place].   Pt states she has 2 days left of Losartan. Please advise.

## 2020-04-27 ENCOUNTER — Other Ambulatory Visit: Payer: Self-pay

## 2020-04-27 DIAGNOSIS — M436 Torticollis: Secondary | ICD-10-CM | POA: Diagnosis not present

## 2020-04-27 DIAGNOSIS — M47812 Spondylosis without myelopathy or radiculopathy, cervical region: Secondary | ICD-10-CM | POA: Diagnosis not present

## 2020-04-27 DIAGNOSIS — M9901 Segmental and somatic dysfunction of cervical region: Secondary | ICD-10-CM | POA: Diagnosis not present

## 2020-04-27 DIAGNOSIS — S233XXA Sprain of ligaments of thoracic spine, initial encounter: Secondary | ICD-10-CM | POA: Diagnosis not present

## 2020-04-27 DIAGNOSIS — M9902 Segmental and somatic dysfunction of thoracic region: Secondary | ICD-10-CM | POA: Diagnosis not present

## 2020-04-27 DIAGNOSIS — M9903 Segmental and somatic dysfunction of lumbar region: Secondary | ICD-10-CM | POA: Diagnosis not present

## 2020-04-27 DIAGNOSIS — M47816 Spondylosis without myelopathy or radiculopathy, lumbar region: Secondary | ICD-10-CM | POA: Diagnosis not present

## 2020-04-27 NOTE — Patient Outreach (Signed)
  [place] [facility]) Care Management Chronic Special Needs Program  04/27/2020  Name: [name] DOB: 03-Jul-1935  MRN: [id number]  [name] is enrolled in a chronic special needs plan for Diabetes. Client called with no answer No answer and HIPAA compliant message left. 1st attempt Plan for 2nd outreach call in 1-2 weeks Chronic care management coordinator will attempt outreach in 1-2 weeks.   Peter Garter RN, Jack[facility]ul[facility] [place], CDE Chronic Care Management Coordinator [facility] Care Management [phone]

## 2020-05-10 ENCOUNTER — Other Ambulatory Visit: Payer: Self-pay

## 2020-05-10 NOTE — Patient Outreach (Signed)
°  [place] [facility]) Care Management Chronic Special Needs Program    05/10/2020  Name: [name], DOB: [birth year]  MRN: [id number]   Ms. [name] is enrolled in a chronic special needs plan for Diabetes. Client called to review and update individualized care plan.  HIPAA verified Client requests that [facility] call back in a month after her doctors appointment on August 11 as she feels it would be more beneficial to talk with [facility] after she has had her most recent lab work. Plan to outreach in one to months per clients request.  Peter Garter RN, [facility], [place] Management [phone]

## 2020-05-25 DIAGNOSIS — M9903 Segmental and somatic dysfunction of lumbar region: Secondary | ICD-10-CM | POA: Diagnosis not present

## 2020-05-25 DIAGNOSIS — M9902 Segmental and somatic dysfunction of thoracic region: Secondary | ICD-10-CM | POA: Diagnosis not present

## 2020-05-25 DIAGNOSIS — M47812 Spondylosis without myelopathy or radiculopathy, cervical region: Secondary | ICD-10-CM | POA: Diagnosis not present

## 2020-05-25 DIAGNOSIS — M47816 Spondylosis without myelopathy or radiculopathy, lumbar region: Secondary | ICD-10-CM | POA: Diagnosis not present

## 2020-05-25 DIAGNOSIS — S233XXA Sprain of ligaments of thoracic spine, initial encounter: Secondary | ICD-10-CM | POA: Diagnosis not present

## 2020-05-25 DIAGNOSIS — M9901 Segmental and somatic dysfunction of cervical region: Secondary | ICD-10-CM | POA: Diagnosis not present

## 2020-05-25 DIAGNOSIS — M436 Torticollis: Secondary | ICD-10-CM | POA: Diagnosis not present

## 2020-05-31 ENCOUNTER — Ambulatory Visit: Payer: HMO

## 2020-06-07 ENCOUNTER — Ambulatory Visit (INDEPENDENT_AMBULATORY_CARE_PROVIDER_SITE_OTHER): Payer: HMO

## 2020-06-07 DIAGNOSIS — Z0000 Encounter for general adult medical examination without abnormal findings: Secondary | ICD-10-CM | POA: Diagnosis not present

## 2020-06-07 NOTE — Patient Instructions (Addendum)
[name] , Thank you for taking time to come for your Medicare Wellness Visit. I appreciate your ongoing commitment to your health goals. Please review the following plan we discussed and let me know if I can assist you in the future.   Screening recommendations/referrals: Colonoscopy: No longer required Mammogram: No longer required Bone Density: No longer required Recommended yearly ophthalmology/optometry visit for glaucoma screening and checkup Recommended yearly dental visit for hygiene and checkup  Vaccinations: Influenza vaccine: Up to date Pneumococcal vaccine: Up to date Tdap vaccine: Up to date Shingles vaccine: Shingrix discussed. Please contact your pharmacy for coverage information.    Covid-19:Pt will bring in copy at 06/09/20 visit  Advanced directives: Please bring a copy of your health care power of attorney and living will to the office at your convenience.  Conditions/risks identified: Lose weight  Next appointment: Follow up in one year for your annual wellness visit    Preventive Care 65 Years and Older, Female Preventive care refers to lifestyle choices and visits with your health care provider that can promote health and wellness. What does preventive care include?  A yearly physical exam. This is also called an annual well check.  Dental exams once or twice a year.  Routine eye exams. Ask your health care provider how often you should have your eyes checked.  Personal lifestyle choices, including:  Daily care of your teeth and gums.  Regular physical activity.  Eating a healthy diet.  Avoiding tobacco and drug use.  Limiting alcohol use.  Practicing safe sex.  Taking low-dose aspirin every day.  Taking vitamin and mineral supplements as recommended by your health care provider. What happens during an annual well check? The services and screenings [name] by your health care provider during your annual well check will depend on your age,  overall health, lifestyle risk factors, and family history of disease. Counseling  Your health care provider may ask you questions about your:  Alcohol use.  Tobacco use.  Drug use.  Emotional well-being.  Home and relationship well-being.  Sexual activity.  Eating habits.  History of falls.  Memory and ability to understand (cognition).  Work and work Statistician.  Reproductive health. Screening  You may have the following tests or measurements:  Height, weight, and BMI.  Blood pressure.  Lipid and cholesterol levels. These may be checked every 5 years, or more frequently if you are over 91 years old.  Skin check.  Lung cancer screening. You may have this screening every year starting at age 62 if you have a 30-pack-year history of smoking and currently smoke or have quit within the past 15 years.  Fecal occult blood test (FOBT) of the stool. You may have this test every year starting at age 68.  Flexible sigmoidoscopy or colonoscopy. You may have a sigmoidoscopy every 5 years or a colonoscopy every 10 years starting at age 60.  Hepatitis C blood test.  Hepatitis B blood test.  Sexually transmitted disease (STD) testing.  Diabetes screening. This is [name] by checking your blood sugar (glucose) after you have not eaten for a while (fasting). You may have this [name] every 1-3 years.  Bone density scan. This is [name] to screen for osteoporosis. You may have this [name] starting at age 23.  Mammogram. This may be [name] every 1-2 years. Talk to your health care provider about how often you should have regular mammograms. Talk with your health care provider about your test results, treatment options, and if necessary, the  need for more tests. Vaccines  Your health care provider may recommend certain vaccines, such as:  Influenza vaccine. This is recommended every year.  Tetanus, diphtheria, and acellular pertussis (Tdap, Td) vaccine. You may need a Td booster every 10  years.  Zoster vaccine. You may need this after age 60.  Pneumococcal 13-valent conjugate (PCV13) vaccine. One dose is recommended after age 61.  Pneumococcal polysaccharide (PPSV23) vaccine. One dose is recommended after age 12. Talk to your health care provider about which screenings and vaccines you need and how often you need them. This information is not intended to replace advice given to you by your health care provider. Make sure you discuss any questions you have with your health care provider. Document Released: 11/12/2015 Document Revised: 07/05/2016 Document Reviewed: 08/17/2015 Elsevier Interactive Patient Education  2017 [place] Prevention in the Home Falls can cause injuries. They can happen to people of all ages. There are many things you can do to make your home safe and to help prevent falls. What can I do on the outside of my home?  Regularly fix the edges of walkways and driveways and fix any cracks.  Remove anything that might make you trip as you walk through a door, such as a raised step or threshold.  Trim any bushes or trees on the path to your home.  Use bright outdoor lighting.  Clear any walking paths of anything that might make someone trip, such as rocks or tools.  Regularly check to see if handrails are loose or broken. Make sure that both sides of any steps have handrails.  Any raised decks and porches should have guardrails on the edges.  Have any leaves, snow, or ice cleared regularly.  Use sand or salt on walking paths during winter.  Clean up any spills in your garage right away. This includes oil or grease spills. What can I do in the bathroom?  Use night lights.  Install grab bars by the toilet and in the tub and shower. Do not use towel bars as grab bars.  Use non-skid mats or decals in the tub or shower.  If you need to sit down in the shower, use a plastic, non-slip stool.  Keep the floor dry. Clean up any water that  spills on the floor as soon as it happens.  Remove soap buildup in the tub or shower regularly.  Attach bath mats securely with double-sided non-slip rug tape.  Do not have throw rugs and other things on the floor that can make you trip. What can I do in the bedroom?  Use night lights.  Make sure that you have a light by your bed that is easy to reach.  Do not use any sheets or blankets that are too big for your bed. They should not hang down onto the floor.  Have a firm chair that has side arms. You can use this for support while you get dressed.  Do not have throw rugs and other things on the floor that can make you trip. What can I do in the kitchen?  Clean up any spills right away.  Avoid walking on wet floors.  Keep items that you use a lot in easy-to-reach places.  If you need to reach something above you, use a strong step stool that has a grab bar.  Keep electrical cords out of the way.  Do not use floor polish or wax that makes floors slippery. If you must use wax,  use non-skid floor wax.  Do not have throw rugs and other things on the floor that can make you trip. What can I do with my stairs?  Do not leave any items on the stairs.  Make sure that there are handrails on both sides of the stairs and use them. Fix handrails that are broken or loose. Make sure that handrails are as long as the stairways.  Check any carpeting to make sure that it is firmly attached to the stairs. Fix any carpet that is loose or worn.  Avoid having throw rugs at the top or bottom of the stairs. If you do have throw rugs, attach them to the floor with carpet tape.  Make sure that you have a light switch at the top of the stairs and the bottom of the stairs. If you do not have them, ask someone to add them for you. What else can I do to help prevent falls?  Wear shoes that:  Do not have high heels.  Have rubber bottoms.  Are comfortable and fit you well.  Are closed at the  toe. Do not wear sandals.  If you use a stepladder:  Make sure that it is fully opened. Do not climb a closed stepladder.  Make sure that both sides of the stepladder are locked into place.  Ask someone to hold it for you, if possible.  Clearly mark and make sure that you can see:  Any grab bars or handrails.  First and last steps.  Where the edge of each step is.  Use tools that help you move around (mobility aids) if they are needed. These include:  Canes.  Walkers.  Scooters.  Crutches.  Turn on the lights when you go into a dark area. Replace any light bulbs as soon as they burn out.  Set up your furniture so you have a clear path. Avoid moving your furniture around.  If any of your floors are uneven, fix them.  If there are any pets around you, be aware of where they are.  Review your medicines with your doctor. Some medicines can make you feel dizzy. This can increase your chance of falling. Ask your doctor what other things that you can do to help prevent falls. This information is not intended to replace advice given to you by your health care provider. Make sure you discuss any questions you have with your health care provider. Document Released: 08/12/2009 Document Revised: 03/23/2016 Document Reviewed: 11/20/2014 Elsevier Interactive Patient Education  2017 [employer].

## 2020-06-07 NOTE — Progress Notes (Signed)
Virtual Visit via Telephone Note  I connected with  Koula Venier Cosma on 06/07/20 at  3:15 PM EDT by telephone and verified that I am speaking with the correct person using two identifiers.  Medicare Annual Wellness visit completed telephonically due to Covid-19 pandemic.   Persons participating in this call: This Health Coach, patient and Husband  Location: Patient: Home Provider: Office   I discussed the limitations, risks, security and privacy concerns of performing an evaluation and management service by telephone and the availability of in person appointments. The patient expressed understanding and agreed to proceed.  Unable to perform video visit due to video visit attempted and failed and/or patient does not have video capability.   Some vital signs may be absent or patient reported.   Willette Brace, LPN    Subjective:   JANAE BONSER is a 85 y.o. female who presents for an Initial Medicare Annual Wellness Visit.  Review of Systems     Cardiac Risk Factors include: advanced age (>11men, >58 women);diabetes mellitus;dyslipidemia;hypertension;obesity (BMI >30kg/m2)     Objective:    There were no vitals filed for this visit. There is no height or weight on file to calculate BMI.  Advanced Directives 06/07/2020 05/07/2019 02/17/2016 08/12/2015 02/11/2015  Does Patient Have a Medical Advance Directive? Yes Yes Yes Yes Yes  Type of Advance Directive Living will [place];Living will [place];Living will [place] -  Does patient want to make changes to medical advance directive? - No - Patient declined No - Patient declined - -  Copy of [place] in Chart? - - No - copy requested No - copy requested No - copy requested    Current Medications (verified) Outpatient Encounter Medications as of 06/07/2020  Medication Sig  . B Complex-C (SUPER B COMPLEX) TABS Take 1 tablet by mouth daily.    .  Cholecalciferol (VITAMIN D-3) 1000 UNITS CAPS Take 1,000 Units by mouth 2 (two) times daily.   . cinacalcet (SENSIPAR) 30 MG tablet Take 1 tablet (30 mg total) by mouth every other day. 1 tablet every other day  . glimepiride (AMARYL) 2 MG tablet TK 1 T PO QD  . losartan (COZAAR) 50 MG tablet TAKE 1 TABLET BY MOUTH EVERY DAY  . metFORMIN (GLUCOPHAGE) 500 MG tablet TAKE 2 TABLETS BY MOUTH TWICE DAILY  . omeprazole (PRILOSEC) 20 MG capsule Take 20 mg by mouth daily.  Vladimir Faster Glycol-Propyl Glycol (SYSTANE OP) Place 1 drop into both eyes daily as needed (dry eyes).  . rosuvastatin (CRESTOR) 10 MG tablet Take 0.5 mg by mouth 3 (three) times a week. Monday and Friday   No facility-administered encounter medications on file as of 06/07/2020.    Allergies (verified) Patient has no known allergies.   History: Past Medical History:  Diagnosis Date  . Arthritis   . Cancer ([place])   . Diabetes mellitus without complication ([place])   . GERD (gastroesophageal reflux disease)   . Hyperlipidemia   . Hypertension   . Thyroid disease    Past Surgical History:  Procedure Laterality Date  . BREAST SURGERY    . MELANOMA EXCISION    . TONSILLECTOMY     Family History  Problem Relation Age of Onset  . Cancer Mother   . Hypertension Father    Social History   Socioeconomic History  . Marital status: Married    Spouse name: Not on file  . Number of children: Not on  file  . Years of education: Not on file  . Highest education level: Not on file  Occupational History  . Occupation: retired  Tobacco Use  . Smoking status: Former Smoker    Packs/day: 1.00    Years: 15.00    Pack years: 15.00    Types: Cigarettes    Start date: 09/14/1955    Quit date: 07/14/1969    Years since quitting: 50.9  . Smokeless tobacco: Never Used  . Tobacco comment: quit 40 years ago  Substance and Sexual Activity  . Alcohol use: Never    Alcohol/week: 0.0 standard drinks  . Drug use: Never  . Sexual  activity: Not Currently  Other Topics Concern  . Not on file  Social History Narrative  . Not on file   Social Determinants of Health   Financial Resource Strain: Low Risk   . Difficulty of Paying Living Expenses: Not hard at all  Food Insecurity: No Food Insecurity  . Worried About Charity fundraiser in the Last Year: Never true  . Ran Out of Food in the Last Year: Never true  Transportation Needs: No Transportation Needs  . Lack of Transportation (Medical): No  . Lack of Transportation (Non-Medical): No  Physical Activity: Insufficiently Active  . Days of Exercise per Week: 3 days  . Minutes of Exercise per Session: 30 min  Stress: No Stress Concern Present  . Feeling of Stress : Not at all  Social Connections: Unknown  . Frequency of Communication with Friends and Family: More than three times a week  . Frequency of Social Gatherings with Friends and Family: Twice a week  . Attends Religious Services: Not on file  . Active Member of Clubs or Organizations: Yes  . Attends Archivist Meetings: Not on file  . Marital Status: Married    Tobacco Counseling Counseling given: Not Answered Comment: quit 40 years ago   Clinical Intake:  Pre-visit preparation completed: Yes  Pain : No/denies pain     BMI - recorded: 31.95 Nutritional Status: BMI > 30  Obese Diabetes: Yes CBG done?: No Did pt. bring in CBG monitor from home?: No  How often do you need to have someone help you when you read instructions, pamphlets, or other written materials from your doctor or pharmacy?: 1 - Never  Diabetic?Yes  Interpreter Needed?: No  Information entered by :: Charlott Rakes, LPN   Activities of Daily Living In your present state of health, do you have any difficulty performing the following activities: 06/07/2020  Hearing? N  Vision? N  Difficulty concentrating or making decisions? N  Walking or climbing stairs? N  Dressing or bathing? N  Doing errands, shopping? N   Preparing Food and eating ? N  Using the Toilet? N  In the past six months, have you accidently leaked urine? N  Do you have problems with loss of bowel control? N  Managing your Medications? N  Managing your Finances? N  Housekeeping or managing your Housekeeping? N  Some recent data might be hidden    Patient Care Team: [name], MD as PCP - General (Family Medicine) Dimitri Ped, RN as [facility], [facility]  Indicate any recent Medical Services you may have received from other than [facility] providers in the past year (date may be approximate).     Assessment:   This is a routine wellness examination for Kendra.  Hearing/Vision screen  Hearing Screening   125Hz   250Hz  500Hz  1000Hz  2000Hz  3000Hz  4000Hz  6000Hz  8000Hz   Right ear:           Left ear:           Comments: Pt started wearing hearing but not using at this time  Vision Screening Comments: Dr Katy Fitch follows up annually with exams  Dietary issues and exercise activities discussed: Current Exercise Habits: Home exercise routine, Time (Minutes): 30, Frequency (Times/Week): 3, Weekly Exercise (Minutes/Week): 90  Goals    . Client will verbalize knowledge of self management of Hypertension as evidences by BP reading of 140/90 or less; or as defined by provider     Reports B/P in range    . HEMOGLOBIN A1C < 7.0     Diabetes self management actions:  Glucose monitoring per provider recommendations  Eat Healthy  Check feet daily  Visit provider every 3-6 months as directed  Hbg A1C level every 3-6 months.  Eye Exam yearly    . Maintain timely refills of diabetic medication as prescribed within the year .     Reports on difficulty with medications    . Patient Stated     To lose more weight       Depression Screen PHQ 2/9 Scores 06/07/2020 03/08/2020 11/10/2019 10/01/2019 08/20/2019 07/15/2019 05/07/2019  PHQ - 2 Score 0 0 0 0 0 0 0  Exception  Documentation - - Medical reason - - - -    Fall Risk Fall Risk  06/07/2020 03/08/2020 11/10/2019 08/20/2019 07/15/2019  Falls in the past year? 0 0 0 0 0  Number falls in past yr: 0 - 0 0 0  Injury with Fall? 0 - 0 0 0  Risk for fall due to : Impaired vision - - - -  Follow up Falls prevention discussed - - - -    Any stairs in or around the home? Yes  If so, are there any without handrails? No  Home free of loose throw rugs in walkways, pet beds, electrical cords, etc? Yes  Adequate lighting in your home to reduce risk of falls? Yes   ASSISTIVE DEVICES UTILIZED TO PREVENT FALLS:  Life alert? No  Use of a cane, walker or w/c? No  Grab bars in the bathroom? No  Shower chair or bench in shower? No  Elevated toilet seat or a handicapped toilet? Yes   TIMED UP AND GO:  Was the test performed? No .    Cognitive Function:        Immunizations Immunization History  Administered Date(s) Administered  . Fluad Quad(high Dose 65+) 08/12/2019  . Influenza Split 06/28/2010  . Influenza,inj,Quad PF,6+ Mos 08/12/2015  . Influenza-Unspecified 09/11/2016, 08/14/2017, 08/12/2018  . Pneumococcal Conjugate-13 07/08/2014  . Pneumococcal Polysaccharide-23 03/05/2014  . Tdap 03/15/2011    TDAP status: Up to date Flu Vaccine status: Up to date Pneumococcal vaccine status: Up to date Covid-19 vaccine status: Completed vaccines  Qualifies for Shingles Vaccine? Yes   Zostavax completed No   Shingrix Completed?: No.    Education has been provided regarding the importance of this vaccine. Patient has been advised to call insurance company to determine out of pocket expense if they have not yet received this vaccine. Advised may also receive vaccine at local pharmacy or Health Dept. Verbalized acceptance and understanding.  Screening Tests Health Maintenance  Topic Date Due  . COVID-19 Vaccine (1) Never done  . HEMOGLOBIN A1C  05/05/2020  . INFLUENZA VACCINE  05/30/2020  . FOOT EXAM   07/14/2020  .  OPHTHALMOLOGY EXAM  08/06/2020  . TETANUS/TDAP  03/14/2021  . DEXA SCAN  Completed  . PNA vac Low Risk Adult  Completed    Health Maintenance  Health Maintenance Due  Topic Date Due  . COVID-19 Vaccine (1) Never done  . HEMOGLOBIN A1C  05/05/2020  . INFLUENZA VACCINE  05/30/2020    Colorectal cancer screening: No longer required.  Mammogram status: No longer required.  Bone Density status: Completed 07/15/19. Results reflect: Bone density results: OSTEOPOROSIS. Repeat every 2 years.    Additional Screening:    Vision Screening: Recommended annual ophthalmology exams for early detection of glaucoma and other disorders of the eye. Is the patient up to date with their annual eye exam?  Yes  Who is the provider or what is the name of the office in which the patient attends annual eye exams? Dr Katy Fitch    Dental Screening: Recommended annual dental exams for proper oral hygiene  Community Resource Referral / Chronic Care Management: CRR required this visit?  No   CCM required this visit?  No      Plan:     I have personally reviewed and noted the following in the patient's chart:   . Medical and social history . Use of alcohol, tobacco or illicit drugs  . Current medications and supplements . Functional ability and status . Nutritional status . Physical activity . Advanced directives . List of other physicians . Hospitalizations, surgeries, and ER visits in previous 12 months . Vitals . Screenings to include cognitive, depression, and falls . Referrals and appointments  In addition, I have reviewed and discussed with patient certain preventive protocols, quality metrics, and best practice recommendations. A written personalized care plan for preventive services as well as general preventive health recommendations were provided to patient.     Willette Brace, LPN   05/04/1947   Nurse Notes: None

## 2020-06-09 ENCOUNTER — Ambulatory Visit (INDEPENDENT_AMBULATORY_CARE_PROVIDER_SITE_OTHER): Payer: HMO | Admitting: Family Medicine

## 2020-06-09 ENCOUNTER — Other Ambulatory Visit: Payer: Self-pay

## 2020-06-09 VITALS — BP 140/72 | HR 64 | Temp 98.2°F | Ht 61.0 in | Wt 166.2 lb

## 2020-06-09 DIAGNOSIS — C50919 Malignant neoplasm of unspecified site of unspecified female breast: Secondary | ICD-10-CM

## 2020-06-09 DIAGNOSIS — E119 Type 2 diabetes mellitus without complications: Secondary | ICD-10-CM | POA: Diagnosis not present

## 2020-06-09 DIAGNOSIS — E785 Hyperlipidemia, unspecified: Secondary | ICD-10-CM

## 2020-06-09 DIAGNOSIS — I10 Essential (primary) hypertension: Secondary | ICD-10-CM

## 2020-06-09 LAB — POCT GLYCOSYLATED HEMOGLOBIN (HGB A1C): Hemoglobin A1C: 8.8 % — AB (ref 4.0–5.6)

## 2020-06-09 MED ORDER — OMEPRAZOLE 20 MG PO CPDR: 20.0000 mg | DELAYED_RELEASE_CAPSULE | Freq: Every day | ORAL | 3 refills | Status: DC

## 2020-06-09 MED ORDER — JANUMET XR 50-1000 MG PO TB24: 1.0000 | ORAL_TABLET | Freq: Every day | ORAL | 5 refills | Status: DC

## 2020-06-09 MED ORDER — GLIMEPIRIDE 2 MG PO TABS: 2.0000 mg | ORAL_TABLET | Freq: Every day | ORAL | 3 refills | Status: DC

## 2020-06-09 MED ORDER — ROSUVASTATIN CALCIUM 10 MG PO TABS: ORAL_TABLET | ORAL | 1 refills | Status: DC

## 2020-06-09 NOTE — Patient Instructions (Signed)
It was very nice to see you today!  Your A1c has went up a little bit since last time.  We will start a new medication called Janumet.  Please take 1 pill once daily.  This will place your previous Metformin.  Please come back to see me in 3 months.  Please come back to see me sooner if needed.  Take care, Dr Jerline Pain  Please try these tips to maintain a healthy lifestyle:   Eat at least 3 REAL meals and 1-2 snacks per day.  Aim for no more than 5 hours between eating.  If you eat breakfast, please do so within one hour of getting up.    Each meal should contain half fruits/vegetables, one quarter protein, and one quarter carbs (no bigger than a computer mouse)   Cut down on sweet beverages. This includes juice, soda, and sweet tea.     Drink at least 1 glass of water with each meal and aim for at least 8 glasses per day   Exercise at least 150 minutes every week.

## 2020-06-09 NOTE — Assessment & Plan Note (Signed)
A1c elevated 8.8.  Likely due to dietary indiscretions and decreased dose of Metformin.  Will start Janumet 50-1000 once daily.  Continue Amaryl 2 mg daily.  Follow-up in 3 months to recheck A1c.

## 2020-06-09 NOTE — Progress Notes (Signed)
   [name] is a 85 y.o. female who presents today for an office visit.  Assessment/Plan:  Chronic Problems Addressed Today: Hyperlipidemia Continue Crestor 5 mg 3 times weekly.  Essential hypertension At goal.  Continue losartan 50 mg daily.  Diabetes mellitus without complication (HCC) H4L elevated 8.8.  Likely due to dietary indiscretions and decreased dose of Metformin.  Will start Janumet 50-1000 once daily.  Continue Amaryl 2 mg daily.  Follow-up in 3 months to recheck A1c.     Subjective:  HPI:  See A/p.         Objective:  Physical Exam: BP 140/72   Pulse 64   Temp 98.2 F (36.8 C)   Ht 5\' 1"  (1.549 m)   Wt 166 lb 3.2 oz (75.4 kg)   SpO2 98%   BMI 31.40 kg/m   Gen: No acute distress, resting comfortably CV: Regular rate and rhythm with no murmurs appreciated Pulm: Normal work of breathing, clear to auscultation bilaterally with no crackles, wheezes, or rhonchi Neuro: Grossly normal, moves all extremities Psych: Normal affect and thought content      Aundray Cartlidge M. Jerline Pain, MD 06/09/2020 10:55 AM

## 2020-06-09 NOTE — Assessment & Plan Note (Signed)
At goal.  Continue losartan 50 mg daily. 

## 2020-06-09 NOTE — Assessment & Plan Note (Signed)
Continue Crestor 5 mg 3 times weekly.

## 2020-06-11 ENCOUNTER — Other Ambulatory Visit: Payer: Self-pay

## 2020-06-11 NOTE — Patient Outreach (Signed)
  [place] [facility]) Care Management Chronic Special Needs Program  06/11/2020   Name: AVANA KREISER DOB: 02/17/1935  MRN: [id number]  Ms. Zeanna Sunde is enrolled in a chronic special needs plan for Diabetes. Client called with no answer No answer and HIPAA compliant message left. Plan for rd outreach call in 1-2  weeks Chronic care management coordinator will attempt outreach in 1-2 weeks .   Peter Garter RN, Jackquline Denmark, CDE Chronic Care Management Coordinator [facility] Management [phone]

## 2020-06-16 ENCOUNTER — Ambulatory Visit: Payer: Self-pay

## 2020-06-23 ENCOUNTER — Other Ambulatory Visit: Payer: Self-pay

## 2020-06-23 NOTE — Patient Outreach (Signed)
[facility]) Care Management Chronic Special Needs Program  06/23/2020  Name: TRISTIAN BOUSKA DOB: 1935-03-27  MRN: [id number]  Ms. Sylvan Lahm is enrolled in a chronic special needs plan for Diabetes. Reviewed and updated care plan.  Subjective: Client states that she has been doing good.  States that she saw her doctor on 06/09/20 and her Hemoglobin A1C had gone over 8%.  States that she has been eating more sweets.  States that he changed her medications and she is now on Janumet XR.  States she does not check her blood sugars as he told her she did not need to check them.  States she is now watching what she eats better and eating fewer sweets at night.  States she is walking at least 3 times a week.  States her B/P is good when it is checked.  States she would like to lose some weight.  States she has had both of her COVID shots  Goals Addressed              This Visit's Progress   .  Client will verbalize knowledge of self management of Hypertension as evidences by BP reading of 140/90 or less; or as defined by provider   On track     Reports B/P in range when checked Plan to check B/P regularly Take B/P medications as ordered Plan to follow a low salt diet  Increase activity as tolerated Sent EMMI: Controlling your blood pressure through lifestyle     .  Diabetes Patient stated goal to lose 5-10 lbs in the next 12 months (pt-stated)        Plan to eat low salt and heart healthy meals full of fruits, vegetables, whole grains, lean protein and limit fat, and sugars. Reviewed nutrition counseling benefit provided by [employer]. Will refer to [employer] Health Coach to assist with dietary management of diabetes.    Marland Kitchen  HEMOGLOBIN A1C < 7        Last Hemoglobin A1C 8.8% on 06/09/20 Reinforced to follow a low carbohydrate low salt diet and to watch portion sizes Reviewed use and possible side effects of diabetes medications  Reviewed signs  and symptoms of hypoglycemia and actions to take Snack options: peanut butter on toast, cheese + cheese, crackers or fruit + nuts Follow Plate Method: (create a balanced plate): . 1/2 plate vegetables (broccoli, cauliflower, carrots, brussels sprouts, turnip greens), Aim for 1 cup cooked or 2 cups raw . 1/4 starch (ex- pasta, rice, roll, bread), Aim for 1/2-1 cup, Choose whole grains whenever possible . 1/4 protein (ex- chicken, beef, pork & all beans), Aim for 1 palm size or 1/2 cup, choose lean protein whenever possible and limit processed meats . +1 healthy fat (ex- olive oil, avocado, nuts, seeds, dressing, mayonnaise), Aim for 2-3 teaspoons    .  COMPLETED: Maintain timely refills of diabetic medication as prescribed within the year .   On track     Reports on difficulty with medications Maintaining timely refills of medications per dispense report Reinforced importance of getting medications refilled on time Goal completed 06/23/20    .  COMPLETED: Obtain annual  Lipid Profile, LDL-C   On track     Completed 11/06/19 LDL 86 The goal for LDL is less than 70 mg/dL as you are at high risk for complications Try to avoid saturated fats, trans-fats and eat more fiber Plan to take statin as ordered  Goal completed 06/23/20    .  Obtain Annual Eye (retinal)  Exam    On track     Completed 08/07/19 Plan to keep scheduled eye exam on 08/11/20 Plan to have a dilated eye exam every year    .  Obtain Annual Foot Exam   On track     Completed 07/15/19 Check your skin and feet every day for cuts, bruises, redness, blisters, or sores. Report to provider any problems with your feet Schedule a foot exam with your health care provider once every year    .  Obtain annual screen for micro albuminuria (urine) , nephropathy (kidney problems)   On track     Completed 07/15/19 It is important for your doctor to check your urine for protein at least every year    .  COMPLETED: Obtain Hemoglobin A1C at least 2  times per year   On track     Completed 11/06/19, 06/09/20 It is important to have your Hemoglobin A1C checked every 6 months if you are at goal and every 3 months if you are not at goal Goal completed 06/23/20    .  COMPLETED: Visit Primary Care Provider or Endocrinologist at least 2 times per year    On track     Primary care provider 03/08/20, 06/09/20 Annual Wellness visit 06/07/20 Goal completed 06/23/20       Plan:  Send successful outreach letter with a copy of their individualized care plan, Send individual care plan to provider and Send educational material  Client will be outreached by a [employer] RNCM in 12 months per tier level  Will refer to:  [employer] [place] RN, [facility], [place] Management [phone]

## 2020-07-21 ENCOUNTER — Telehealth: Payer: Self-pay | Admitting: Family Medicine

## 2020-07-21 NOTE — Telephone Encounter (Signed)
Please advise 

## 2020-07-21 NOTE — Telephone Encounter (Signed)
Pt called asking if Dr. Jerline Pain would prescribe her a glucometer so she can check her blood sugar at home. Please advise.

## 2020-07-23 NOTE — Telephone Encounter (Signed)
Ok to send in rx for glucometer

## 2020-07-24 ENCOUNTER — Other Ambulatory Visit: Payer: Self-pay | Admitting: Family Medicine

## 2020-07-24 ENCOUNTER — Other Ambulatory Visit: Payer: Self-pay

## 2020-07-24 DIAGNOSIS — M810 Age-related osteoporosis without current pathological fracture: Secondary | ICD-10-CM

## 2020-07-24 DIAGNOSIS — C50919 Malignant neoplasm of unspecified site of unspecified female breast: Secondary | ICD-10-CM

## 2020-07-27 DIAGNOSIS — E212 Other hyperparathyroidism: Secondary | ICD-10-CM | POA: Diagnosis not present

## 2020-07-27 NOTE — Telephone Encounter (Signed)
Glucometer sent in on 07/24/20.

## 2020-07-28 LAB — MAGNESIUM: Magnesium: 1.9 mg/dL (ref 1.5–2.5)

## 2020-07-28 LAB — PTH, INTACT AND CALCIUM
Calcium: 11.4 mg/dL — ABNORMAL HIGH (ref 8.6–10.4)
PTH: 31 pg/mL (ref 14–64)

## 2020-07-28 LAB — PHOSPHORUS: Phosphorus: 4 mg/dL (ref 2.1–4.3)

## 2020-07-29 ENCOUNTER — Encounter: Payer: Self-pay | Admitting: Family Medicine

## 2020-07-29 ENCOUNTER — Ambulatory Visit (INDEPENDENT_AMBULATORY_CARE_PROVIDER_SITE_OTHER): Payer: HMO | Admitting: Family Medicine

## 2020-07-29 ENCOUNTER — Other Ambulatory Visit: Payer: Self-pay | Admitting: Family Medicine

## 2020-07-29 ENCOUNTER — Other Ambulatory Visit: Payer: Self-pay

## 2020-07-29 ENCOUNTER — Telehealth: Payer: Self-pay

## 2020-07-29 ENCOUNTER — Other Ambulatory Visit: Payer: Self-pay | Admitting: *Deleted

## 2020-07-29 VITALS — BP 125/77 | HR 75 | Temp 98.1°F | Ht 61.0 in | Wt 166.2 lb

## 2020-07-29 DIAGNOSIS — E559 Vitamin D deficiency, unspecified: Secondary | ICD-10-CM | POA: Diagnosis not present

## 2020-07-29 DIAGNOSIS — R300 Dysuria: Secondary | ICD-10-CM | POA: Diagnosis not present

## 2020-07-29 DIAGNOSIS — Z23 Encounter for immunization: Secondary | ICD-10-CM

## 2020-07-29 DIAGNOSIS — E119 Type 2 diabetes mellitus without complications: Secondary | ICD-10-CM

## 2020-07-29 LAB — POCT URINALYSIS DIPSTICK
Bilirubin, UA: NEGATIVE
Glucose, UA: POSITIVE — AB
Ketones, UA: NEGATIVE
Nitrite, UA: NEGATIVE
Protein, UA: POSITIVE — AB
Spec Grav, UA: 1.015 (ref 1.010–1.025)
Urobilinogen, UA: NEGATIVE E.U./dL — AB
pH, UA: 6 (ref 5.0–8.0)

## 2020-07-29 MED ORDER — NITROFURANTOIN MONOHYD MACRO 100 MG PO CAPS: 100.0000 mg | ORAL_CAPSULE | Freq: Two times a day (BID) | ORAL | 0 refills | Status: DC

## 2020-07-29 MED ORDER — BLOOD GLUCOSE MONITOR KIT: PACK | 0 refills | Status: DC

## 2020-07-29 MED ORDER — BLOOD GLUCOSE MONITOR KIT
PACK | 0 refills | Status: DC
Start: 2020-07-29 — End: 2020-07-29

## 2020-07-29 NOTE — Progress Notes (Signed)
   [name] is a 85 y.o. female who presents today for an office visit.  Assessment/Plan:  New/Acute Problems: UTI History and UA consistent with UTI.  Will start [place].  Encourage good oral hydration.  Will check urine culture.  No red flags or signs of systemic illness today.  Chronic Problems Addressed Today: Vitamin D deficiency Discussed vitamin D dosing with patient.  Will check vitamin D level next blood draw.  Diabetes mellitus without complication (HCC) Continue Janumet 50-1000 once daily.  She will follow-up for A1c check in a couple of months.  We will need to check B12 level as well.  Flu vaccine given today.    Subjective:  HPI:  Patient here with UTI symptoms.  Started 2 days ago.  Feels similar to prior UTIs.  She has had pain and discomfort.  Also increased frequency and urgency.  Tried taking Azo with no improvement.  No fevers or chills.  No nausea or vomiting.  No back pain.       Objective:  Physical Exam: BP 125/77   Pulse 75   Temp 98.1 F (36.7 C) (Temporal)   Ht 5\' 1"  (1.549 m)   Wt 166 lb 3.2 oz (75.4 kg)   SpO2 97%   BMI 31.40 kg/m   Gen: No acute distress, resting comfortably Neuro: Grossly normal, moves all extremities Psych: Normal affect and thought content      Caryn Gienger M. Jerline Pain, MD 07/29/2020 11:58 AM

## 2020-07-29 NOTE — Telephone Encounter (Signed)
Pt called asking for Dr. Jerline Pain to send in prescription for glucometer. Please advise.

## 2020-07-29 NOTE — Telephone Encounter (Signed)
Rx was resend

## 2020-07-29 NOTE — Assessment & Plan Note (Signed)
Continue Janumet 50-1000 once daily.  She will follow-up for A1c check in a couple of months.  We will need to check B12 level as well.

## 2020-07-29 NOTE — Patient Instructions (Signed)
It was nice to see you!  You have a urinary tract infection. Please start the antibiotic.  We will check a urine culture to make sure you do not have a resistant bacteria. We will call you if we need to change your medications.   Please make sure you are drinking plenty of fluids over the next few days.  If your symptoms do not improve over the next 5-7 days, or if they worsen, please let us know. Please also let us know if you have worsening back pain, fevers, chills, or body aches.   Take care, Dr Ece Cumberland  

## 2020-07-29 NOTE — Assessment & Plan Note (Signed)
Discussed vitamin D dosing with patient.  Will check vitamin D level next blood draw.

## 2020-07-30 LAB — URINE CULTURE
MICRO NUMBER:: 11015540
SPECIMEN QUALITY:: ADEQUATE

## 2020-08-02 NOTE — Progress Notes (Signed)
Please inform patient of the following:  Urine culture was inconclusive. Would like for her to finish her antibiotics and let us know if her symptoms are not improving.  [name]. Jerline Pain, MD 08/02/2020 8:13 AM

## 2020-08-03 ENCOUNTER — Encounter: Payer: Self-pay | Admitting: "Endocrinology

## 2020-08-03 ENCOUNTER — Other Ambulatory Visit: Payer: Self-pay

## 2020-08-03 ENCOUNTER — Ambulatory Visit (INDEPENDENT_AMBULATORY_CARE_PROVIDER_SITE_OTHER): Payer: HMO | Admitting: "Endocrinology

## 2020-08-03 VITALS — BP 122/76 | HR 85 | Ht 61.0 in | Wt 167.0 lb

## 2020-08-03 DIAGNOSIS — E212 Other hyperparathyroidism: Secondary | ICD-10-CM | POA: Diagnosis not present

## 2020-08-03 MED ORDER — CINACALCET HCL 30 MG PO TABS
30.0000 mg | ORAL_TABLET | Freq: Every day | ORAL | 3 refills | Status: DC
Start: 2020-08-03 — End: 2020-11-03

## 2020-08-03 NOTE — Progress Notes (Signed)
08/03/2020, 11:36 AM       Endocrinology follow-up note   [name] is a 85 y.o.-year-old female, here for follow-up of hypercalcemia related to hyperparathyroidism. PMD: her  Brenda Barrack, MD    Past Medical History:  Diagnosis Date  . Arthritis   . Cancer ([place])   . Diabetes mellitus without complication ([place])   . GERD (gastroesophageal reflux disease)   . Hyperlipidemia   . Hypertension   . Thyroid disease     Past Surgical History:  Procedure Laterality Date  . BREAST SURGERY    . MELANOMA EXCISION    . TONSILLECTOMY      Social History   Tobacco Use  . Smoking status: Former Smoker    Packs/day: 1.00    Years: 15.00    Pack years: 15.00    Types: Cigarettes    Start date: 09/14/1955    Quit date: 07/14/1969    Years since quitting: 51.0  . Smokeless tobacco: Never Used  . Tobacco comment: quit 40 years ago  Substance Use Topics  . Alcohol use: Never    Alcohol/week: 0.0 standard drinks  . Drug use: Never    Family History  Problem Relation Age of Onset  . Cancer Mother   . Hypertension Father     Outpatient Encounter Medications as of 08/03/2020  Medication Sig  . B Complex-C (SUPER B COMPLEX) TABS Take 1 tablet by mouth daily.    . blood glucose meter kit and supplies KIT Dispense based on patient and insurance preference. Use up to four times daily as directed. (FOR ICD-9 250.00, 250.01).  . Cholecalciferol (VITAMIN D-3) 1000 UNITS CAPS Take 1,000 Units by mouth 2 (two) times daily.   . cinacalcet (SENSIPAR) 30 MG tablet Take 1 tablet (30 mg total) by mouth daily with breakfast.  . glimepiride (AMARYL) 2 MG tablet Take 1 tablet (2 mg total) by mouth daily with breakfast.  . losartan (COZAAR) 50 MG tablet TAKE 1 TABLET BY MOUTH EVERY DAY  . nitrofurantoin, macrocrystal-monohydrate, (MACROBID) 100 MG capsule Take 1 capsule (100 mg total) by mouth 2 (two) times daily.  Marland Kitchen  omeprazole (PRILOSEC) 20 MG capsule Take 1 capsule (20 mg total) by mouth daily.  Glory Rosebush VERIO test strip CHECK FOUR TIMES DAILY  . Polyethyl Glycol-Propyl Glycol (SYSTANE OP) Place 1 drop into both eyes daily as needed (dry eyes).  . rosuvastatin (CRESTOR) 10 MG tablet Three times weekly, MWF  . SitaGLIPtin-MetFORMIN HCl (JANUMET XR) 50-1000 MG TB24 Take 1 tablet by mouth daily.  Marland Kitchen tetrahydrozoline 0.05 % ophthalmic solution 2 (two) times daily.   . [DISCONTINUED] cinacalcet (SENSIPAR) 30 MG tablet Take 1 tablet (30 mg total) by mouth every other day. 1 tablet every other day   No facility-administered encounter medications on file as of 08/03/2020.    No Known Allergies   HPI  [name] was diagnosed with hypercalcemia in May 2020.   She was recently was determined to be related to early mild primary hyperparathyroidism.  Since she will not a surgical candidate, she was started on Sensipar 30 mg p.o. every other  day.  After initially responding with calcium of 10.2, prior to this visit her labs show calcium up to 11.4 while PTH dropped to 31.   She reports having cramps as a side effect of Sensipar when she was taking it daily. She does not have recent bone density to review-an order is in place for her.  She still did not complete the study before this visit. -She was recently found to have a 3 mm nonobstructive right sided nephrolithiasis.   No history of CKD.   she is not on HCTZ or other thiazide therapy.  She is on vitamin D supplement.  She is not on calcium supplements. she eats dairy and green, leafy, vegetables on average amounts.  she does not have a family history of hypercalcemia, pituitary tumors, thyroid cancer, or osteoporosis.  -On history, she lost 2 inches of height over the years.  I reviewed her chart and she also has a history of controlled type 2 diabetes on Metformin, recent A1c of 6.6%.    ROS: Limited as above.  PE: BP 122/76   Pulse 85   Ht  5' 1" (1.549 m)   Wt 167 lb (75.8 kg)   BMI 31.55 kg/m , Body mass index is 31.55 kg/m. Wt Readings from Last 3 Encounters:  08/03/20 167 lb (75.8 kg)  07/29/20 166 lb 3.2 oz (75.4 kg)  06/09/20 166 lb 3.2 oz (75.4 kg)       CMP     Component Value Date/Time   NA 137 03/24/2020 1122   NA 140 03/12/2018 0000   NA 145 02/15/2017 1333   NA 144 08/12/2015 1419   K 4.2 03/24/2020 1122   K 4.4 02/15/2017 1333   K 4.2 08/12/2015 1419   CL 102 03/24/2020 1122   CL 104 02/15/2017 1333   CO2 28 03/24/2020 1122   CO2 28 02/15/2017 1333   CO2 29 08/12/2015 1419   GLUCOSE 217 (H) 03/24/2020 1122   GLUCOSE 142 (H) 02/15/2017 1333   BUN 11 03/24/2020 1122   BUN 11 03/12/2018 0000   BUN 13 02/15/2017 1333   BUN 11.8 08/12/2015 1419   CREATININE 0.82 03/24/2020 1122   CREATININE 0.8 08/12/2015 1419   CALCIUM 11.4 (H) 07/27/2020 1441   CALCIUM 10.2 02/15/2017 1333   CALCIUM 11.1 (H) 08/12/2015 1419   PROT 6.9 03/24/2020 1122   PROT 7.5 02/15/2017 1333   PROT 7.2 08/12/2015 1419   ALBUMIN 150 MG/L 07/15/2019 1130   ALBUMIN 3.9 08/12/2015 1419   AST 17 03/24/2020 1122   AST 32 02/15/2017 1333   AST 26 08/12/2015 1419   ALT 21 03/24/2020 1122   ALT 39 02/15/2017 1333   ALT 32 08/12/2015 1419   ALKPHOS 61 03/17/2019 0000   ALKPHOS 76 02/15/2017 1333   ALKPHOS 74 08/12/2015 1419   BILITOT 0.6 03/24/2020 1122   BILITOT 0.90 02/15/2017 1333   BILITOT 0.52 08/12/2015 1419   GFRNONAA 67 03/24/2020 1122   GFRAA 77 03/24/2020 1122     Diabetic Labs (most recent): Lab Results  Component Value Date   HGBA1C 8.8 (A) 06/09/2020   HGBA1C 6.6 (H) 11/06/2019   HGBA1C 6.3 (A) 07/15/2019     Lipid Panel ( most recent) Lipid Panel     Component Value Date/Time   CHOL 147 11/06/2019 0836   TRIG 207 (H) 11/06/2019 0836   HDL 31 (L) 11/06/2019 0836   CHOLHDL 4.7 11/06/2019 0836   LDLCALC 86 11/06/2019 0836  Lab Results  Component Value Date   TSH 3.96 11/21/2019   TSH  4.00 03/17/2019   TSH 4.36 03/12/2018   TSH 2.93 03/12/2017      Assessment: 1. Hypercalcemia / Hyperparathyroidism  Plan: Patient has had several instances of elevated calcium, with the highest level being at 11.8 in May 2020.  Her previsit labs show increased calcium of 11.4 from 10.4 during her last visit.  Her PTH is 31 improving from 72.   - Patient also  has vitamin D deficiency on supplement, most current vitamin D was 39. -PTH RP is normal, malignancy related hypercalcemia unlikely. -There seems to be an early complication from hypercalcemia/hypocalcemia with 3 mm nonobstructive right thyroid nephrolithiasis.  no history of   osteoporosis,fragility fractures. No abdominal pain, no major mood disorders, no bone pain-she is encouraged to obtain her bone density.  -She is not a surgical candidate and wishes to avoid surgery.  She will need a higher dose of Sensipar to control hypercalcemia.  She is hesitant, however willing to try Sensipar 30 mg p.o. daily, side effects and precautions discussed with her.     -She will  need DEXA scan to include the distal  33% of  radius for evaluation of cortical bone on her subsequent visits.  She is advised to maintain close follow-up with her PCP.    - Time spent on this patient care encounter:  20 minutes of which 50% was spent in  counseling and the rest reviewing  her current and  previous labs / studies and medications  doses and developing a plan for long term care. Rubie Maid Olvey  participated in the discussions, expressed understanding, and voiced agreement with the above plans.  All questions were answered to her satisfaction. she is encouraged to contact clinic should she have any questions or concerns prior to her return visit.  - Return in about 3 months (around 11/03/2020) for F/U with Pre-visit Labs.   Glade Lloyd, MD [facility] [address] [place], [place]  [address] Phone: [phone]  Fax: [phone]    This note was partially dictated with voice recognition software. Similar sounding words can be transcribed inadequately or may not  be corrected upon review.  08/03/2020, 11:36 AM

## 2020-08-11 ENCOUNTER — Encounter: Payer: Self-pay | Admitting: Family Medicine

## 2020-08-11 DIAGNOSIS — H353131 Nonexudative age-related macular degeneration, bilateral, early dry stage: Secondary | ICD-10-CM | POA: Diagnosis not present

## 2020-08-11 DIAGNOSIS — H18593 Other hereditary corneal dystrophies, bilateral: Secondary | ICD-10-CM | POA: Diagnosis not present

## 2020-08-11 DIAGNOSIS — Z961 Presence of intraocular lens: Secondary | ICD-10-CM | POA: Diagnosis not present

## 2020-08-11 DIAGNOSIS — E119 Type 2 diabetes mellitus without complications: Secondary | ICD-10-CM | POA: Diagnosis not present

## 2020-08-11 DIAGNOSIS — H04123 Dry eye syndrome of bilateral lacrimal glands: Secondary | ICD-10-CM | POA: Diagnosis not present

## 2020-08-11 DIAGNOSIS — D3132 Benign neoplasm of left choroid: Secondary | ICD-10-CM | POA: Diagnosis not present

## 2020-08-11 DIAGNOSIS — D3131 Benign neoplasm of right choroid: Secondary | ICD-10-CM | POA: Diagnosis not present

## 2020-08-11 LAB — HM DIABETES EYE EXAM

## 2020-09-04 ENCOUNTER — Other Ambulatory Visit: Payer: Self-pay | Admitting: Family Medicine

## 2020-09-04 DIAGNOSIS — E785 Hyperlipidemia, unspecified: Secondary | ICD-10-CM

## 2020-09-04 DIAGNOSIS — C50919 Malignant neoplasm of unspecified site of unspecified female breast: Secondary | ICD-10-CM

## 2020-09-08 ENCOUNTER — Other Ambulatory Visit: Payer: Self-pay

## 2020-09-08 NOTE — Patient Outreach (Signed)
  [facility]) Care Management Chronic Special Needs Program    09/08/2020  Name: IRIEL NASON, DOB: December 18, 1934  MRN: [id number]   Ms. Nialah Saravia is enrolled in a chronic special needs plan for Diabetes.  [facility] will continue to provide services for this client through 10/29/2020. The [employer] care management team will assume care 10/30/2020.  Peter Garter RN, Jackquline Denmark, CDE Chronic Care Management Coordinator [facility] Management [phone]

## 2020-09-09 ENCOUNTER — Ambulatory Visit: Payer: HMO | Admitting: Family Medicine

## 2020-09-14 ENCOUNTER — Encounter: Payer: Self-pay | Admitting: Family Medicine

## 2020-09-14 ENCOUNTER — Other Ambulatory Visit: Payer: Self-pay

## 2020-09-14 ENCOUNTER — Ambulatory Visit (INDEPENDENT_AMBULATORY_CARE_PROVIDER_SITE_OTHER): Payer: HMO | Admitting: Family Medicine

## 2020-09-14 VITALS — BP 132/70 | HR 71 | Temp 98.0°F | Ht 61.0 in | Wt 167.2 lb

## 2020-09-14 DIAGNOSIS — N390 Urinary tract infection, site not specified: Secondary | ICD-10-CM

## 2020-09-14 DIAGNOSIS — R319 Hematuria, unspecified: Secondary | ICD-10-CM

## 2020-09-14 DIAGNOSIS — J310 Chronic rhinitis: Secondary | ICD-10-CM | POA: Diagnosis not present

## 2020-09-14 DIAGNOSIS — I10 Essential (primary) hypertension: Secondary | ICD-10-CM | POA: Diagnosis not present

## 2020-09-14 DIAGNOSIS — E119 Type 2 diabetes mellitus without complications: Secondary | ICD-10-CM | POA: Diagnosis not present

## 2020-09-14 DIAGNOSIS — R300 Dysuria: Secondary | ICD-10-CM

## 2020-09-14 LAB — POCT URINALYSIS DIPSTICK
Bilirubin, UA: NEGATIVE
Glucose, UA: POSITIVE — AB
Ketones, UA: NEGATIVE
Nitrite, UA: NEGATIVE
Protein, UA: NEGATIVE
Spec Grav, UA: 1.015 (ref 1.010–1.025)
Urobilinogen, UA: 0.2 E.U./dL
pH, UA: 6 (ref 5.0–8.0)

## 2020-09-14 LAB — POCT GLYCOSYLATED HEMOGLOBIN (HGB A1C): Hemoglobin A1C: 7.8 % — AB (ref 4.0–5.6)

## 2020-09-14 MED ORDER — AZELASTINE HCL 0.1 % NA SOLN: 2.0000 | Freq: Two times a day (BID) | NASAL | 12 refills | Status: DC

## 2020-09-14 MED ORDER — NITROFURANTOIN MONOHYD MACRO 100 MG PO CAPS: 100.0000 mg | ORAL_CAPSULE | Freq: Two times a day (BID) | ORAL | 0 refills | Status: DC

## 2020-09-14 NOTE — Assessment & Plan Note (Addendum)
A1c improved to 7.8 which is acceptable due to age.  Will continue janumet 50-1000 once daily. Recheck A1c in 3-6 months.

## 2020-09-14 NOTE — Assessment & Plan Note (Addendum)
Has seen ENT and underwent laryngoscopy with no abnormalties. Will start astelin. She will let me know if not improving.

## 2020-09-14 NOTE — Progress Notes (Signed)
   [name] is a 85 y.o. female who presents today for an office visit.  Assessment/Plan:  New/Acute Problems: Recurrent UTI / Hematuria UA consistent with UTI.  Persistent hematuria noted as well.  Will check urine culture.  Empirically start [place].  Given recurrent UTI with hematuria will place referral to Urogyn. .  Chronic Problems Addressed Today: Diabetes mellitus without complication (HCC) M4B improved to 7.8 which is acceptable due to age.  Will continue janumet 50-1000 once daily. Recheck A1c in 3-6 months.   Essential hypertension At goal. Continue losartan 50mg  daily.   Rhinitis Has seen ENT and underwent laryngoscopy with no abnormalties. Will start astelin. She will let me know if not improving.      Subjective:  HPI:  Patient is here for follow-up.  Please see A/P for status of chronic conditions.  She is concerned she may have another UTI.  She has had more burning with urination for the past 3 days and lower abdominal pain.  No gross hematuria.  No fevers or chills.  No nausea or vomiting.  This is her fourth UTI in the past year.       Objective:  Physical Exam: BP 132/70   Pulse 71   Temp 98 F (36.7 C) (Temporal)   Ht 5\' 1"  (1.549 m)   Wt 167 lb 3.2 oz (75.8 kg)   SpO2 98%   BMI 31.59 kg/m   Gen: No acute distress, resting comfortably CV: Regular rate and rhythm with no murmurs appreciated Pulm: Normal work of breathing, clear to auscultation bilaterally with no crackles, wheezes, or rhonchi Neuro: Grossly normal, moves all extremities Psych: Normal affect and thought content      Anyae Griffith M. Jerline Pain, MD 09/14/2020 10:22 AM

## 2020-09-14 NOTE — Patient Instructions (Signed)
It was very nice to see you today!  Please try the Astelin.  You have another UTI.  Please start the [place].  I will place a referral for you to see the urologist.  Your blood sugar looks good.  Please continue your current medication.  I will see you back in 3 months.  Please come back to see me sooner if needed.  Take care, Dr Jerline Pain  Please try these tips to maintain a healthy lifestyle:   Eat at least 3 REAL meals and 1-2 snacks per day.  Aim for no more than 5 hours between eating.  If you eat breakfast, please do so within one hour of getting up.    Each meal should contain half fruits/vegetables, one quarter protein, and one quarter carbs (no bigger than a computer mouse)   Cut down on sweet beverages. This includes juice, soda, and sweet tea.     Drink at least 1 glass of water with each meal and aim for at least 8 glasses per day   Exercise at least 150 minutes every week.

## 2020-09-14 NOTE — Addendum Note (Signed)
Addended by: Betti Cruz on: 09/14/2020 11:38 AM   Modules accepted: Orders

## 2020-09-14 NOTE — Assessment & Plan Note (Signed)
At goal.  Continue losartan 50 mg daily. 

## 2020-09-15 LAB — URINE CULTURE
MICRO NUMBER:: 11209359
SPECIMEN QUALITY:: ADEQUATE

## 2020-09-16 NOTE — Progress Notes (Signed)
Please inform patient of the following:  Urine culture negative but still recommend that she follow up with urology as we discussed at her office visit.

## 2020-09-20 DIAGNOSIS — M9903 Segmental and somatic dysfunction of lumbar region: Secondary | ICD-10-CM | POA: Diagnosis not present

## 2020-09-20 DIAGNOSIS — M436 Torticollis: Secondary | ICD-10-CM | POA: Diagnosis not present

## 2020-09-20 DIAGNOSIS — M9901 Segmental and somatic dysfunction of cervical region: Secondary | ICD-10-CM | POA: Diagnosis not present

## 2020-09-20 DIAGNOSIS — M47812 Spondylosis without myelopathy or radiculopathy, cervical region: Secondary | ICD-10-CM | POA: Diagnosis not present

## 2020-09-20 DIAGNOSIS — M47816 Spondylosis without myelopathy or radiculopathy, lumbar region: Secondary | ICD-10-CM | POA: Diagnosis not present

## 2020-09-20 DIAGNOSIS — S233XXA Sprain of ligaments of thoracic spine, initial encounter: Secondary | ICD-10-CM | POA: Diagnosis not present

## 2020-09-20 DIAGNOSIS — M9902 Segmental and somatic dysfunction of thoracic region: Secondary | ICD-10-CM | POA: Diagnosis not present

## 2020-09-21 ENCOUNTER — Telehealth: Payer: Self-pay

## 2020-09-21 NOTE — Telephone Encounter (Signed)
Called and spoke with pt and gave below message. 

## 2020-09-21 NOTE — Telephone Encounter (Signed)
That is ok.  Algis Greenhouse. Jerline Pain, MD 09/21/2020 1:00 PM

## 2020-09-21 NOTE — Telephone Encounter (Signed)
Patient is calling in stating that she is taking B Complex-C (SUPER B COMPLEX) TABS, and has found a new vitamin that has 200 MG worth of calcium and is wondering if it would be okay to take and replace it.

## 2020-09-21 NOTE — Telephone Encounter (Signed)
See below

## 2020-09-29 ENCOUNTER — Telehealth: Payer: Self-pay

## 2020-09-29 NOTE — Telephone Encounter (Signed)
Patient would like to change her b- complex to centrum for women and it has 200 mg of calcium in it and patient is taking medication for her thyroid and would like to know if its okay to change

## 2020-09-29 NOTE — Telephone Encounter (Signed)
See below

## 2020-09-30 NOTE — Telephone Encounter (Signed)
That change is OK.   Algis Greenhouse. Jerline Pain, MD 09/30/2020 9:09 AM

## 2020-10-01 NOTE — Telephone Encounter (Signed)
Called and spoke with pt and below message was given. 

## 2020-10-03 ENCOUNTER — Other Ambulatory Visit: Payer: Self-pay | Admitting: Family Medicine

## 2020-10-07 ENCOUNTER — Telehealth: Payer: Self-pay

## 2020-10-07 NOTE — Telephone Encounter (Signed)
Referral has been faxed again.

## 2020-10-07 NOTE — Telephone Encounter (Signed)
Pt states she had not heard anything about her referral. Pt called [facility] herself and they stated they had not gotten anything from our office. She went ahead and set up an appt with a Dr. Tresa Moore for next week. She is requesting a referral be sent for that Dr.

## 2020-10-12 DIAGNOSIS — R310 Gross hematuria: Secondary | ICD-10-CM | POA: Diagnosis not present

## 2020-10-12 DIAGNOSIS — R300 Dysuria: Secondary | ICD-10-CM | POA: Diagnosis not present

## 2020-10-19 ENCOUNTER — Telehealth: Payer: Self-pay

## 2020-10-19 NOTE — Telephone Encounter (Signed)
Pt states she is still having bladder pain. She is asking if pain medication could be sent in to hold her over til the 28th when she gets more testing done.

## 2020-10-20 MED ORDER — PHENAZOPYRIDINE HCL 200 MG PO TABS: 200.0000 mg | ORAL_TABLET | Freq: Three times a day (TID) | ORAL | 0 refills | Status: DC | PRN

## 2020-10-20 NOTE — Telephone Encounter (Signed)
Please advise 

## 2020-10-20 NOTE — Addendum Note (Signed)
Addended by: [name] on: 10/20/2020 10:28 AM   Modules accepted: Orders

## 2020-10-26 DIAGNOSIS — N132 Hydronephrosis with renal and ureteral calculous obstruction: Secondary | ICD-10-CM | POA: Diagnosis not present

## 2020-10-26 DIAGNOSIS — K5730 Diverticulosis of large intestine without perforation or abscess without bleeding: Secondary | ICD-10-CM | POA: Diagnosis not present

## 2020-10-26 DIAGNOSIS — R310 Gross hematuria: Secondary | ICD-10-CM | POA: Diagnosis not present

## 2020-10-26 DIAGNOSIS — Z853 Personal history of malignant neoplasm of breast: Secondary | ICD-10-CM | POA: Diagnosis not present

## 2020-10-27 DIAGNOSIS — E212 Other hyperparathyroidism: Secondary | ICD-10-CM | POA: Diagnosis not present

## 2020-10-28 LAB — PTH, INTACT AND CALCIUM
Calcium: 10.4 mg/dL — ABNORMAL HIGH (ref 8.7–10.3)
PTH: 19 pg/mL (ref 15–65)

## 2020-11-03 ENCOUNTER — Encounter: Payer: Self-pay | Admitting: "Endocrinology

## 2020-11-03 ENCOUNTER — Other Ambulatory Visit: Payer: Self-pay

## 2020-11-03 ENCOUNTER — Ambulatory Visit (INDEPENDENT_AMBULATORY_CARE_PROVIDER_SITE_OTHER): Payer: HMO | Admitting: "Endocrinology

## 2020-11-03 VITALS — BP 160/83 | HR 67 | Ht 61.0 in | Wt 169.2 lb

## 2020-11-03 DIAGNOSIS — E212 Other hyperparathyroidism: Secondary | ICD-10-CM

## 2020-11-03 MED ORDER — CINACALCET HCL 30 MG PO TABS: 30.0000 mg | ORAL_TABLET | Freq: Every day | ORAL | 3 refills | Status: DC

## 2020-11-03 NOTE — Progress Notes (Signed)
11/03/2020, 12:46 PM       Endocrinology follow-up note   [name] is a 86 y.o.-year-old female, here for follow-up of hypercalcemia related to hyperparathyroidism. PMD: her  [name], MD    Past Medical History:  Diagnosis Date  . Arthritis   . Cancer ([place])   . Diabetes mellitus without complication ([place])   . GERD (gastroesophageal reflux disease)   . Hyperlipidemia   . Hypertension   . Thyroid disease     Past Surgical History:  Procedure Laterality Date  . BREAST SURGERY    . MELANOMA EXCISION    . TONSILLECTOMY      Social History   Tobacco Use  . Smoking status: Former Smoker    Packs/day: 1.00    Years: 15.00    Pack years: 15.00    Types: Cigarettes    Start date: 09/14/1955    Quit date: 07/14/1969    Years since quitting: 51.3  . Smokeless tobacco: Never Used  . Tobacco comment: quit 40 years ago  Substance Use Topics  . Alcohol use: Never    Alcohol/week: 0.0 standard drinks  . Drug use: Never    Family History  Problem Relation Age of Onset  . Cancer Mother   . Hypertension Father     Outpatient Encounter Medications as of 11/03/2020  Medication Sig  . Multiple Vitamins-Minerals (CENTRUM SILVER 50+WOMEN PO) Take 1 tablet by mouth daily in the afternoon.  . sodium chloride (MURO 128) 2 % ophthalmic solution Place 1 drop into both eyes 2 (two) times daily.  Marland Kitchen azelastine (ASTELIN) 0.1 % nasal spray Place 2 sprays into both nostrils 2 (two) times daily.  . B Complex-C (SUPER B COMPLEX) TABS Take 1 tablet by mouth daily.   (Patient not taking: Reported on 11/03/2020)  . blood glucose meter kit and supplies KIT Dispense based on patient and insurance preference. Use up to four times daily as directed. (FOR ICD-9 250.00, 250.01).  . Cholecalciferol (VITAMIN D-3) 1000 UNITS CAPS Take 2,000 Units by mouth 2 (two) times daily.  . cinacalcet (SENSIPAR) 30 MG tablet Take 1 tablet  (30 mg total) by mouth daily with breakfast.  . glimepiride (AMARYL) 2 MG tablet Take 1 tablet (2 mg total) by mouth daily with breakfast.  . losartan (COZAAR) 50 MG tablet TAKE 1 TABLET BY MOUTH EVERY DAY  . nitrofurantoin, macrocrystal-monohydrate, (MACROBID) 100 MG capsule Take 1 capsule (100 mg total) by mouth 2 (two) times daily.  Marland Kitchen omeprazole (PRILOSEC) 20 MG capsule Take 1 capsule (20 mg total) by mouth daily.  Glory Rosebush VERIO test strip CHECK FOUR TIMES DAILY  . phenazopyridine (PYRIDIUM) 200 MG tablet Take 1 tablet (200 mg total) by mouth 3 (three) times daily as needed for pain.  Vladimir Faster Glycol-Propyl Glycol (SYSTANE OP) Place 1 drop into both eyes daily as needed (dry eyes). (Patient not taking: Reported on 11/03/2020)  . rosuvastatin (CRESTOR) 10 MG tablet TAKE 1 TABLET THREE TIMES WEEKLY MONDAY WEDNESDAY AND FRIDAY  . SitaGLIPtin-MetFORMIN HCl (JANUMET XR) 50-1000 MG TB24 Take 1 tablet by mouth daily.  Marland Kitchen tetrahydrozoline 0.05 %  ophthalmic solution 2 (two) times daily.   . [DISCONTINUED] cinacalcet (SENSIPAR) 30 MG tablet Take 1 tablet (30 mg total) by mouth daily with breakfast.   No facility-administered encounter medications on file as of 11/03/2020.    No Known Allergies   HPI  [name] was diagnosed with hypercalcemia in May 2020.   Prior to her last visit, her work-up helped determine the hypercalcemia to be secondary to early mild primary hyperparathyroidism.     Since she will not a surgical candidate, she was started on Sensipar. She is currently on Sensipar 30 mg p.o. daily at breakfast.  She continues to tolerate this medication.  Her previsit labs show improved calcium at 10.4 from 11.4, PTH at 19 dropping from 72.  She has no new complaints today. She does not have recent bone density to review-an order is in place for her.  She still did not complete the study before this visit. -She was recently found to have a 3 mm nonobstructive right sided  nephrolithiasis.   No history of CKD.   she is not on HCTZ or other thiazide therapy.  She is on vitamin D supplement.  She is not on calcium supplements. she eats dairy and green, leafy, vegetables on average amounts.  she does not have a family history of hypercalcemia, pituitary tumors, thyroid cancer, or osteoporosis.  -On history, she lost 2 inches of height over the years.  I reviewed her chart and she also has a history of controlled type 2 diabetes on Metformin, recent A1c of 6.6%.    ROS: Limited as above.  PE: BP (!) 160/83   Pulse 67   Ht '5\' 1"'  (1.549 m)   Wt 169 lb 3.2 oz (76.7 kg)   BMI 31.97 kg/m , Body mass index is 31.97 kg/m. Wt Readings from Last 3 Encounters:  11/03/20 169 lb 3.2 oz (76.7 kg)  09/14/20 167 lb 3.2 oz (75.8 kg)  08/03/20 167 lb (75.8 kg)       CMP     Component Value Date/Time   NA 137 03/24/2020 1122   NA 140 03/12/2018 0000   NA 145 02/15/2017 1333   NA 144 08/12/2015 1419   K 4.2 03/24/2020 1122   K 4.4 02/15/2017 1333   K 4.2 08/12/2015 1419   CL 102 03/24/2020 1122   CL 104 02/15/2017 1333   CO2 28 03/24/2020 1122   CO2 28 02/15/2017 1333   CO2 29 08/12/2015 1419   GLUCOSE 217 (H) 03/24/2020 1122   GLUCOSE 142 (H) 02/15/2017 1333   BUN 11 03/24/2020 1122   BUN 11 03/12/2018 0000   BUN 13 02/15/2017 1333   BUN 11.8 08/12/2015 1419   CREATININE 0.82 03/24/2020 1122   CREATININE 0.8 08/12/2015 1419   CALCIUM 10.4 (H) 10/27/2020 1100   CALCIUM 10.2 02/15/2017 1333   CALCIUM 11.1 (H) 08/12/2015 1419   PROT 6.9 03/24/2020 1122   PROT 7.5 02/15/2017 1333   PROT 7.2 08/12/2015 1419   ALBUMIN 150 MG/L 07/15/2019 1130   ALBUMIN 3.9 08/12/2015 1419   AST 17 03/24/2020 1122   AST 32 02/15/2017 1333   AST 26 08/12/2015 1419   ALT 21 03/24/2020 1122   ALT 39 02/15/2017 1333   ALT 32 08/12/2015 1419   ALKPHOS 61 03/17/2019 0000   ALKPHOS 76 02/15/2017 1333   ALKPHOS 74 08/12/2015 1419   BILITOT 0.6 03/24/2020 1122    BILITOT 0.90 02/15/2017 1333   BILITOT 0.52 08/12/2015 1419  GFRNONAA 67 03/24/2020 1122   GFRAA 77 03/24/2020 1122     Diabetic Labs (most recent): Lab Results  Component Value Date   HGBA1C 7.8 (A) 09/14/2020   HGBA1C 8.8 (A) 06/09/2020   HGBA1C 6.6 (H) 11/06/2019     Lipid Panel ( most recent) Lipid Panel     Component Value Date/Time   CHOL 147 11/06/2019 0836   TRIG 207 (H) 11/06/2019 0836   HDL 31 (L) 11/06/2019 0836   CHOLHDL 4.7 11/06/2019 0836   LDLCALC 86 11/06/2019 0836      Lab Results  Component Value Date   TSH 3.96 11/21/2019   TSH 4.00 03/17/2019   TSH 4.36 03/12/2018   TSH 2.93 03/12/2017      Assessment: 1. Hypercalcemia / Hyperparathyroidism  Plan: See notes from prior visits.  She is responding to low-dose Sensipar with calcium 10.4, PTH 19 improving from 72.   She will continue to benefit from this low-dose intervention with Sensipar.  She agrees to continue Sensipar 30 mg p.o. daily at breakfast.  - Patient also  has vitamin D deficiency on supplement, most current vitamin D was 39. -PTH RP is normal, malignancy related hypercalcemia unlikely. -There seems to be an early complication from hypercalcemia/hypocalcemia with 3 mm nonobstructive right thyroid nephrolithiasis.  no history of   osteoporosis,fragility fractures. No abdominal pain, no major mood disorders, no bone pain-she is encouraged to obtain her bone density.  -She is not a surgical candidate and wishes to avoid surgery.      -She will  need DEXA scan to include the distal  33% of  radius for evaluation of cortical bone on her subsequent visits.  She is advised to maintain close follow-up with her PCP.     - Time spent on this patient care encounter:  20 minutes of which 50% was spent in  counseling and the rest reviewing  her current and  previous labs / studies and medications  doses and developing a plan for long term care. Rubie Maid Colwell  participated in the  discussions, expressed understanding, and voiced agreement with the above plans.  All questions were answered to her satisfaction. she is encouraged to contact clinic should she have any questions or concerns prior to her return visit.   - Return in about 4 months (around 03/03/2021) for F/U with Pre-visit Labs.   Glade Lloyd, MD [facility] [address] [place], [place] [address] Phone: [phone]  Fax: [phone]    This note was partially dictated with voice recognition software. Similar sounding words can be transcribed inadequately or may not  be corrected upon review.  11/03/2020, 12:46 PM

## 2020-11-05 ENCOUNTER — Other Ambulatory Visit: Payer: Self-pay

## 2020-11-09 DIAGNOSIS — R300 Dysuria: Secondary | ICD-10-CM | POA: Diagnosis not present

## 2020-11-09 DIAGNOSIS — R310 Gross hematuria: Secondary | ICD-10-CM | POA: Diagnosis not present

## 2020-11-09 DIAGNOSIS — N200 Calculus of kidney: Secondary | ICD-10-CM | POA: Diagnosis not present

## 2020-12-17 ENCOUNTER — Ambulatory Visit (INDEPENDENT_AMBULATORY_CARE_PROVIDER_SITE_OTHER): Payer: HMO | Admitting: Family Medicine

## 2020-12-17 ENCOUNTER — Other Ambulatory Visit: Payer: Self-pay

## 2020-12-17 VITALS — BP 133/76 | HR 70 | Temp 97.6°F | Ht 61.0 in | Wt 168.8 lb

## 2020-12-17 DIAGNOSIS — E119 Type 2 diabetes mellitus without complications: Secondary | ICD-10-CM | POA: Diagnosis not present

## 2020-12-17 DIAGNOSIS — I10 Essential (primary) hypertension: Secondary | ICD-10-CM | POA: Diagnosis not present

## 2020-12-17 DIAGNOSIS — N200 Calculus of kidney: Secondary | ICD-10-CM | POA: Insufficient documentation

## 2020-12-17 LAB — POCT GLYCOSYLATED HEMOGLOBIN (HGB A1C): Hemoglobin A1C: 8.3 % — AB (ref 4.0–5.6)

## 2020-12-17 MED ORDER — PHENAZOPYRIDINE HCL 200 MG PO TABS
200.0000 mg | ORAL_TABLET | Freq: Three times a day (TID) | ORAL | 0 refills | Status: DC | PRN
Start: 2020-12-17 — End: 2021-06-15

## 2020-12-17 NOTE — Assessment & Plan Note (Signed)
At goal.  Continue losartan 50 mg daily. 

## 2020-12-17 NOTE — Assessment & Plan Note (Signed)
Had lengthy discussion with patient regarding her recent diagnosis of bilateral nephrolithiasis. Reviewed her 2020 ultrasound in detail. Appears that she had a 3 cm and 2 cm stone at that time. She does not currently have any symptoms. She will be following up with urology soon. We will refill her Pyridium today as this is worked well in the past. Discussed reasons to return to care or seek emergent care.

## 2020-12-17 NOTE — Assessment & Plan Note (Signed)
A1c slightly up to 8.3. This is acceptable given her age. We will continue current regimen of Amaryl 2 mg daily and Janumet 50-1000 once daily. Recheck A1c in 3 months. If continues to go up we will need to increase dose of Janumet.

## 2020-12-17 NOTE — Progress Notes (Signed)
   [name] is a 86 y.o. female who presents today for an office visit.  Assessment/Plan:  Chronic Problems Addressed Today: Nephrolithiasis Had lengthy discussion with patient regarding her recent diagnosis of bilateral nephrolithiasis. Reviewed her 2020 ultrasound in detail. Appears that she had a 3 cm and 2 cm stone at that time. She does not currently have any symptoms. She will be following up with urology soon. We will refill her Pyridium today as this is worked well in the past. Discussed reasons to return to care or seek emergent care.  Essential hypertension At goal. Continue losartan 50 mg daily.  Diabetes mellitus without complication (HCC) R9X slightly up to 8.3. This is acceptable given her age. We will continue current regimen of Amaryl 2 mg daily and Janumet 50-1000 once daily. Recheck A1c in 3 months. If continues to go up we will need to increase dose of Janumet.     Subjective:  HPI:  Patient here for follow-up. Since her last visit she has followed up with urology. She was having issues with recurrent UTIs for the past years. Her urologist performed a scan which I do not have access to. Was told that she has bilateral kidney stones that will require surgery for removal. She is not aware of the size of the kidney stones but was told that she would need to be admitted to the hospital and undergo three surgeries. Symptoms are currently manageable. Occasionally has intense bladder spasms with urinary urgency. No current flank pain. No nausea or vomiting.  See A/P for status of other chronic conditions.       Objective:  Physical Exam: BP 133/76   Pulse 70   Temp 97.6 F (36.4 C) (Temporal)   Ht 5\' 1"  (1.549 m)   Wt 168 lb 12.8 oz (76.6 kg)   SpO2 97%   BMI 31.89 kg/m   Gen: No acute distress, resting comfortably Neuro: Grossly normal, moves all extremities Psych: Normal affect and thought content   Time Spent: 50 minutes of total time was spent on the  date of the encounter performing the following actions: chart review prior to seeing the patient, obtaining history, performing a medically necessary exam, reviewing her ultrasound from 2020 in detail with patient, counseling on the treatment plan, placing orders, and documenting in our EHR.       Algis Greenhouse. Jerline Pain, MD 12/17/2020 10:24 AM

## 2020-12-17 NOTE — Patient Instructions (Signed)
It was very nice to see you today!  Your A1c went up a little bit but it is still okay where it is.  We will not make any medication changes today.  I would like to see you back in about 3 months to recheck your blood sugar.  I will refill the medication to help with your urinary tract pain.  Take care, Dr Jerline Pain  Please try these tips to maintain a healthy lifestyle:   Eat at least 3 REAL meals and 1-2 snacks per day.  Aim for no more than 5 hours between eating.  If you eat breakfast, please do so within one hour of getting up.    Each meal should contain half fruits/vegetables, one quarter protein, and one quarter carbs (no bigger than a computer mouse)   Cut down on sweet beverages. This includes juice, soda, and sweet tea.     Drink at least 1 glass of water with each meal and aim for at least 8 glasses per day   Exercise at least 150 minutes every week.

## 2020-12-20 ENCOUNTER — Other Ambulatory Visit: Payer: Self-pay | Admitting: Family Medicine

## 2021-01-24 DIAGNOSIS — D485 Neoplasm of uncertain behavior of skin: Secondary | ICD-10-CM | POA: Diagnosis not present

## 2021-01-24 DIAGNOSIS — L720 Epidermal cyst: Secondary | ICD-10-CM | POA: Diagnosis not present

## 2021-02-09 ENCOUNTER — Other Ambulatory Visit: Payer: Self-pay | Admitting: Family Medicine

## 2021-02-10 DIAGNOSIS — L720 Epidermal cyst: Secondary | ICD-10-CM | POA: Diagnosis not present

## 2021-02-10 DIAGNOSIS — D485 Neoplasm of uncertain behavior of skin: Secondary | ICD-10-CM | POA: Diagnosis not present

## 2021-02-23 DIAGNOSIS — E212 Other hyperparathyroidism: Secondary | ICD-10-CM | POA: Diagnosis not present

## 2021-02-24 LAB — PTH, INTACT AND CALCIUM
Calcium: 10 mg/dL (ref 8.7–10.3)
PTH: 62 pg/mL (ref 15–65)

## 2021-03-02 ENCOUNTER — Ambulatory Visit: Payer: HMO | Admitting: "Endocrinology

## 2021-03-03 ENCOUNTER — Ambulatory Visit: Payer: HMO | Admitting: "Endocrinology

## 2021-03-04 ENCOUNTER — Other Ambulatory Visit: Payer: Self-pay

## 2021-03-04 ENCOUNTER — Encounter: Payer: Self-pay | Admitting: "Endocrinology

## 2021-03-04 ENCOUNTER — Ambulatory Visit: Payer: HMO | Admitting: "Endocrinology

## 2021-03-04 VITALS — BP 136/78 | HR 80 | Ht 61.0 in | Wt 167.2 lb

## 2021-03-04 DIAGNOSIS — E212 Other hyperparathyroidism: Secondary | ICD-10-CM | POA: Diagnosis not present

## 2021-03-04 MED ORDER — CINACALCET HCL 30 MG PO TABS: 30.0000 mg | ORAL_TABLET | ORAL | 1 refills | Status: DC

## 2021-03-04 NOTE — Progress Notes (Signed)
03/04/2021, 12:54 PM       Endocrinology follow-up note   [name] is a 86 y.o.-year-old female, here for follow-up of hypercalcemia related to hyperparathyroidism. PMD: her  Brenda Barrack, MD    Past Medical History:  Diagnosis Date  . Arthritis   . Cancer ([place])   . Diabetes mellitus without complication ([place])   . GERD (gastroesophageal reflux disease)   . Hyperlipidemia   . Hypertension   . Thyroid disease     Past Surgical History:  Procedure Laterality Date  . BREAST SURGERY    . MELANOMA EXCISION    . TONSILLECTOMY      Social History   Tobacco Use  . Smoking status: Former Smoker    Packs/day: 1.00    Years: 15.00    Pack years: 15.00    Types: Cigarettes    Start date: 09/14/1955    Quit date: 07/14/1969    Years since quitting: 51.6  . Smokeless tobacco: Never Used  . Tobacco comment: quit 40 years ago  Substance Use Topics  . Alcohol use: Never    Alcohol/week: 0.0 standard drinks  . Drug use: Never    Family History  Problem Relation Age of Onset  . Cancer Mother   . Hypertension Father     Outpatient Encounter Medications as of 03/04/2021  Medication Sig  . B Complex-C (SUPER B COMPLEX) TABS Take 1 tablet by mouth daily.  . blood glucose meter kit and supplies KIT Dispense based on patient and insurance preference. Use up to four times daily as directed. (FOR ICD-9 250.00, 250.01).  . Cholecalciferol (VITAMIN D-3) 1000 UNITS CAPS Take 2,000 Units by mouth 2 (two) times daily.  . cinacalcet (SENSIPAR) 30 MG tablet Take 1 tablet (30 mg total) by mouth every other day.  Marland Kitchen glimepiride (AMARYL) 2 MG tablet Take 1 tablet (2 mg total) by mouth daily with breakfast.  . losartan (COZAAR) 50 MG tablet TAKE 1 TABLET BY MOUTH EVERY DAY  . Multiple Vitamins-Minerals (CENTRUM SILVER 50+WOMEN PO) Take 1 tablet by mouth daily in the afternoon.  Marland Kitchen omeprazole (PRILOSEC) 20 MG capsule TAKE  1 CAPSULE(20 MG) BY MOUTH DAILY  . ONETOUCH VERIO test strip CHECK FOUR TIMES DAILY  . phenazopyridine (PYRIDIUM) 200 MG tablet Take 1 tablet (200 mg total) by mouth 3 (three) times daily as needed for pain.  Vladimir Faster Glycol-Propyl Glycol (SYSTANE OP) Place 1 drop into both eyes daily as needed (dry eyes).  . rosuvastatin (CRESTOR) 10 MG tablet TAKE 1 TABLET THREE TIMES WEEKLY MONDAY WEDNESDAY AND FRIDAY  . SitaGLIPtin-MetFORMIN HCl (JANUMET XR) 50-1000 MG TB24 Take 1 tablet by mouth daily.  . sodium chloride (MURO 128) 2 % ophthalmic solution Place 1 drop into both eyes 2 (two) times daily.  Marland Kitchen tetrahydrozoline 0.05 % ophthalmic solution 2 (two) times daily.   . [DISCONTINUED] cinacalcet (SENSIPAR) 30 MG tablet Take 1 tablet (30 mg total) by mouth daily with breakfast.   No facility-administered encounter medications on file as of 03/04/2021.    No Known Allergies   HPI  [name] was diagnosed with hypercalcemia  in May 2020.   Prior to her last visit, her work-up helped determine the hypercalcemia to be secondary to early mild primary hyperparathyroidism.     Since she was  not a surgical candidate, she was started on Sensipar. She is currently on Sensipar 30 mg p.o. daily every day with breakfast.  She continues to tolerate this medication.  Her previsit labs show calcium controlled at 10, PTH at 62.  Is a general improvement in her calcium from 11.4, PTH from 72.  She has no new complaints today.   She is known to have recurrent nephrolithiasis.  No history of CKD.   she is not on HCTZ or other thiazide therapy.  She is on vitamin D supplement.  She is not on calcium supplements. she eats dairy and green, leafy, vegetables on average amounts.  she does not have a family history of hypercalcemia, pituitary tumors, thyroid cancer, or osteoporosis.  -On history, she lost 2 inches of height over the years.  I reviewed her chart and she also has a history of controlled type 2  diabetes on Metformin, recent A1c of 6.6%.    ROS: Limited as above.  PE: BP 136/78   Pulse 80   Ht '5\' 1"'  (1.549 m)   Wt 167 lb 3.2 oz (75.8 kg)   BMI 31.59 kg/m , Body mass index is 31.59 kg/m. Wt Readings from Last 3 Encounters:  03/04/21 167 lb 3.2 oz (75.8 kg)  12/17/20 168 lb 12.8 oz (76.6 kg)  11/03/20 169 lb 3.2 oz (76.7 kg)       CMP     Component Value Date/Time   NA 137 03/24/2020 1122   NA 140 03/12/2018 0000   NA 145 02/15/2017 1333   NA 144 08/12/2015 1419   K 4.2 03/24/2020 1122   K 4.4 02/15/2017 1333   K 4.2 08/12/2015 1419   CL 102 03/24/2020 1122   CL 104 02/15/2017 1333   CO2 28 03/24/2020 1122   CO2 28 02/15/2017 1333   CO2 29 08/12/2015 1419   GLUCOSE 217 (H) 03/24/2020 1122   GLUCOSE 142 (H) 02/15/2017 1333   BUN 11 03/24/2020 1122   BUN 11 03/12/2018 0000   BUN 13 02/15/2017 1333   BUN 11.8 08/12/2015 1419   CREATININE 0.82 03/24/2020 1122   CREATININE 0.8 08/12/2015 1419   CALCIUM 10.0 02/23/2021 0818   CALCIUM 10.2 02/15/2017 1333   CALCIUM 11.1 (H) 08/12/2015 1419   PROT 6.9 03/24/2020 1122   PROT 7.5 02/15/2017 1333   PROT 7.2 08/12/2015 1419   ALBUMIN 150 MG/L 07/15/2019 1130   ALBUMIN 3.9 08/12/2015 1419   AST 17 03/24/2020 1122   AST 32 02/15/2017 1333   AST 26 08/12/2015 1419   ALT 21 03/24/2020 1122   ALT 39 02/15/2017 1333   ALT 32 08/12/2015 1419   ALKPHOS 61 03/17/2019 0000   ALKPHOS 76 02/15/2017 1333   ALKPHOS 74 08/12/2015 1419   BILITOT 0.6 03/24/2020 1122   BILITOT 0.90 02/15/2017 1333   BILITOT 0.52 08/12/2015 1419   GFRNONAA 67 03/24/2020 1122   GFRAA 77 03/24/2020 1122     Diabetic Labs (most recent): Lab Results  Component Value Date   HGBA1C 8.3 (A) 12/17/2020   HGBA1C 7.8 (A) 09/14/2020   HGBA1C 8.8 (A) 06/09/2020     Lipid Panel ( most recent) Lipid Panel     Component Value Date/Time   CHOL 147 11/06/2019 0836   TRIG 207 (H) 11/06/2019 1610  HDL 31 (L) 11/06/2019 0836   CHOLHDL 4.7  11/06/2019 0836   LDLCALC 86 11/06/2019 0836      Lab Results  Component Value Date   TSH 3.96 11/21/2019   TSH 4.00 03/17/2019   TSH 4.36 03/12/2018   TSH 2.93 03/12/2017      Assessment: 1. Hypercalcemia / Hyperparathyroidism  Plan: See notes from prior visits.  She is responding to low-dose Sensipar with stabilization of her calcium/PTH.    She will continue to benefit from this low-dose intervention with Sensipar.  She is allowed to lower her Sensipar 30 mg to every other day until next measurement of PTH/calcium in 4 months.  - Patient also  has vitamin D deficiency on supplement, most current vitamin D was 39. -PTH RP is normal, malignancy related hypercalcemia unlikely. -There seems to be a complication from hypercalcemia/hypocalcemia with 65m nonobstructive right sided nephrolithiasis.  no history of   osteoporosis,fragility fractures. No abdominal pain, no major mood disorders, no bone pain-she is encouraged to obtain her bone density.  -She is not a surgical candidate and wishes to avoid surgery.      -She does not have recent bone density to review.  She will  need DEXA scan to include the distal  33% of  radius for evaluation of cortical bone on her subsequent visits.  She is advised to maintain close follow-up with her PCP.   I spent 25 minutes in the care of the patient today including review of labs from Thyroid Function, CMP, and other relevant labs ; imaging/biopsy records (current and previous including abstractions from other facilities); face-to-face time discussing  her lab results and symptoms, medications doses, her options of short and long term treatment based on the latest standards of care / guidelines;   and documenting the encounter.  ERubie MaidPresnell  participated in the discussions, expressed understanding, and voiced agreement with the above plans.  All questions were answered to her satisfaction. she is encouraged to contact clinic should she have  any questions or concerns prior to her return visit.   - Return in about 4 months (around 07/05/2021) for NV with Gabrella Stroh, F/U with Pre-visit Labs.   GGlade Lloyd MD [facility] Center[address] [place] [address]Phone: [phone] Fax: [phone]   This note was partially dictated with voice recognition software. Similar sounding words can be transcribed inadequately or may not  be corrected upon review.  03/04/2021, 12:54 PM

## 2021-03-08 ENCOUNTER — Telehealth: Payer: Self-pay | Admitting: Family Medicine

## 2021-03-08 NOTE — Chronic Care Management (AMB) (Signed)
  Chronic Care Management   Note  03/08/2021 Name: DAWNNA GRITZ MRN: [id number] DOB: Jul 01, 1935  Rubie Maid Belford is a 86 y.o. year old female who is a primary care patient of [name], MD. I reached out to Lourena Simmonds by phone today in response to a referral sent by Ms. Rubie Maid Muratore's PCP, [name], MD.   Ms. Payes was given information about Chronic Care Management services today including:  1. CCM service includes personalized support from designated clinical staff supervised by her physician, including individualized plan of care and coordination with other care providers 2. 24/7 contact phone numbers for assistance for urgent and routine care needs. 3. Service will only be billed when office clinical staff spend 20 minutes or more in a month to coordinate care. 4. Only one practitioner may furnish and bill the service in a calendar month. 5. The patient may stop CCM services at any time (effective at the end of the month) by phone call to the office staff.   Patient agreed to services and verbal consent obtained.   Follow up plan:   Lauretta Grill [employer] Scheduler

## 2021-03-18 ENCOUNTER — Ambulatory Visit (INDEPENDENT_AMBULATORY_CARE_PROVIDER_SITE_OTHER): Payer: HMO | Admitting: Family Medicine

## 2021-03-18 ENCOUNTER — Other Ambulatory Visit: Payer: Self-pay

## 2021-03-18 ENCOUNTER — Encounter: Payer: Self-pay | Admitting: Family Medicine

## 2021-03-18 VITALS — BP 124/70 | HR 73 | Temp 97.2°F | Ht 60.0 in | Wt 165.2 lb

## 2021-03-18 DIAGNOSIS — E119 Type 2 diabetes mellitus without complications: Secondary | ICD-10-CM

## 2021-03-18 DIAGNOSIS — I10 Essential (primary) hypertension: Secondary | ICD-10-CM | POA: Diagnosis not present

## 2021-03-18 DIAGNOSIS — C50011 Malignant neoplasm of nipple and areola, right female breast: Secondary | ICD-10-CM | POA: Diagnosis not present

## 2021-03-18 DIAGNOSIS — Z170 Estrogen receptor positive status [ER+]: Secondary | ICD-10-CM | POA: Diagnosis not present

## 2021-03-18 DIAGNOSIS — E785 Hyperlipidemia, unspecified: Secondary | ICD-10-CM | POA: Diagnosis not present

## 2021-03-18 DIAGNOSIS — E559 Vitamin D deficiency, unspecified: Secondary | ICD-10-CM | POA: Diagnosis not present

## 2021-03-18 DIAGNOSIS — C50012 Malignant neoplasm of nipple and areola, left female breast: Secondary | ICD-10-CM | POA: Diagnosis not present

## 2021-03-18 LAB — CBC
HCT: 43.6 % (ref 36.0–46.0)
Hemoglobin: 15.1 g/dL — ABNORMAL HIGH (ref 12.0–15.0)
MCHC: 34.6 g/dL (ref 30.0–36.0)
MCV: 89.1 fl (ref 78.0–100.0)
Platelets: 258 10*3/uL (ref 150.0–400.0)
RBC: 4.9 Mil/uL (ref 3.87–5.11)
RDW: 13.7 % (ref 11.5–15.5)
WBC: 7.5 10*3/uL (ref 4.0–10.5)

## 2021-03-18 LAB — COMPREHENSIVE METABOLIC PANEL
ALT: 24 U/L (ref 0–35)
AST: 20 U/L (ref 0–37)
Albumin: 4.4 g/dL (ref 3.5–5.2)
Alkaline Phosphatase: 89 U/L (ref 39–117)
BUN: 12 mg/dL (ref 6–23)
CO2: 25 mEq/L (ref 19–32)
Calcium: 10.9 mg/dL — ABNORMAL HIGH (ref 8.4–10.5)
Chloride: 102 mEq/L (ref 96–112)
Creatinine, Ser: 0.94 mg/dL (ref 0.40–1.20)
GFR: 55.93 mL/min — ABNORMAL LOW (ref >60.00)
Glucose, Bld: 281 mg/dL — ABNORMAL HIGH (ref 70–99)
Potassium: 4.8 mEq/L (ref 3.5–5.1)
Sodium: 137 mEq/L (ref 135–145)
Total Bilirubin: 0.7 mg/dL (ref 0.2–1.2)
Total Protein: 7.7 g/dL (ref 6.0–8.3)

## 2021-03-18 LAB — LIPID PANEL
Cholesterol: 159 mg/dL (ref 0–200)
HDL: 30.1 mg/dL — ABNORMAL LOW (ref >39.00)
NonHDL: 129.39
Total CHOL/HDL Ratio: 5
Triglycerides: 368 mg/dL — ABNORMAL HIGH (ref 0.0–149.0)
VLDL: 73.6 mg/dL — ABNORMAL HIGH (ref 0.0–40.0)

## 2021-03-18 LAB — VITAMIN D 25 HYDROXY (VIT D DEFICIENCY, FRACTURES): VITD: 54.12 ng/mL (ref 30.00–100.00)

## 2021-03-18 LAB — LDL CHOLESTEROL, DIRECT: Direct LDL: 91 mg/dL

## 2021-03-18 LAB — TSH: TSH: 5.27 u[IU]/mL — ABNORMAL HIGH (ref 0.35–4.50)

## 2021-03-18 LAB — POCT GLYCOSYLATED HEMOGLOBIN (HGB A1C): Hemoglobin A1C: 8.5 % — AB (ref 4.0–5.6)

## 2021-03-18 LAB — VITAMIN B12: Vitamin B-12: 365 pg/mL (ref 211–911)

## 2021-03-18 MED ORDER — JANUMET XR 50-1000 MG PO TB24: 1.0000 | ORAL_TABLET | Freq: Two times a day (BID) | ORAL | 3 refills | Status: DC

## 2021-03-18 NOTE — Assessment & Plan Note (Signed)
On crestor 10 mg 3 times weekly.  We will check lipids today.

## 2021-03-18 NOTE — Patient Instructions (Signed)
It was very nice to see you today!  Please take 2 of your Janumet.  I will see you back in 3 months.  Please come back to see me sooner if needed.  Take care, Dr Jerline Pain  PLEASE NOTE:  If you had any lab tests please let us know if you have not heard back within a few days. You may see your results on mychart before we have a chance to review them but we will give you a call once they are reviewed by [name]. If we ordered any referrals today, please let us know if you have not heard from their office within the next week.   Please try these tips to maintain a healthy lifestyle:   Eat at least 3 REAL meals and 1-2 snacks per day.  Aim for no more than 5 hours between eating.  If you eat breakfast, please do so within one hour of getting up.    Each meal should contain half fruits/vegetables, one quarter protein, and one quarter carbs (no bigger than a computer mouse)   Cut down on sweet beverages. This includes juice, soda, and sweet tea.     Drink at least 1 glass of water with each meal and aim for at least 8 glasses per day   Exercise at least 150 minutes every week.

## 2021-03-18 NOTE — Assessment & Plan Note (Signed)
Check vitamin D. 

## 2021-03-18 NOTE — Assessment & Plan Note (Signed)
Stable

## 2021-03-18 NOTE — Assessment & Plan Note (Signed)
At goal.  Continue losartan 50 mg daily. 

## 2021-03-18 NOTE — Progress Notes (Signed)
   [name] is a 86 y.o. female who presents today for an office visit.  Assessment/Plan:  Chronic Problems Addressed Today: Hyperlipidemia On crestor 10 mg 3 times weekly.  We will check lipids today.  Vitamin D deficiency Check vitamin D.  Essential hypertension At goal.  Continue losartan 50 mg daily.  Diabetes mellitus without complication (HCC) E7N 8.5.  We will increase her Janumet to 50-1000 twice daily.  She will recheck again in 3 months.  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     Subjective:  HPI:  See A/p.       Objective:  Physical Exam: BP 124/70   Pulse 73   Temp (!) 97.2 F (36.2 C)   Ht 5' (1.524 m)   Wt 165 lb 3.2 oz (74.9 kg)   SpO2 98%   BMI 32.26 kg/m   Wt Readings from Last 3 Encounters:  03/18/21 165 lb 3.2 oz (74.9 kg)  03/04/21 167 lb 3.2 oz (75.8 kg)  12/17/20 168 lb 12.8 oz (76.6 kg)  Gen: No acute distress, resting comfortably CV: Regular rate and rhythm with no murmurs appreciated Pulm: Normal work of breathing, clear to auscultation bilaterally with no crackles, wheezes, or rhonchi Neuro: Grossly normal, moves all extremities Psych: Normal affect and thought content      Jasmon Mattice M. Jerline Pain, MD 03/18/2021 10:43 AM

## 2021-03-18 NOTE — Assessment & Plan Note (Signed)
A1c 8.5.  We will increase her Janumet to 50-1000 twice daily.  She will recheck again in 3 months.

## 2021-03-21 NOTE — Progress Notes (Signed)
Please inform patient of the following:  Her thyroid is off just a little bit.  This could also explain some of her symptoms.  Would like for her to come back in a couple weeks to recheck TSH, free T4, and free T3.  Rest of her blood work was all stable.  Would like to see her back in 3 months to recheck her A1c.

## 2021-03-22 ENCOUNTER — Other Ambulatory Visit: Payer: Self-pay | Admitting: *Deleted

## 2021-03-22 DIAGNOSIS — E038 Other specified hypothyroidism: Secondary | ICD-10-CM

## 2021-03-24 ENCOUNTER — Other Ambulatory Visit: Payer: Self-pay | Admitting: *Deleted

## 2021-04-01 DIAGNOSIS — Z1231 Encounter for screening mammogram for malignant neoplasm of breast: Secondary | ICD-10-CM | POA: Diagnosis not present

## 2021-04-01 DIAGNOSIS — Z853 Personal history of malignant neoplasm of breast: Secondary | ICD-10-CM | POA: Diagnosis not present

## 2021-04-01 LAB — HM MAMMOGRAPHY

## 2021-04-14 ENCOUNTER — Telehealth: Payer: Self-pay

## 2021-04-14 NOTE — Chronic Care Management (AMB) (Signed)
Chronic Care Management Pharmacy Assistant   Name: [name]  MRN: [id number] DOB: 1935-01-13  Reason for Encounter: Chart Prep/IQ  Recent office visits:  03/18/21- Brenda Chyle, MD- seen for chronic conditions, increased sitagliptin-metformin 50-1000 mg from one tab daily to one tab twice daily,labs ordered,  follow up 3 months  12/17/20- Brenda Chyle, MD- seen for chronic conditions, no medication changes, follow up 3 months   Recent consult visits:  03/04/21- Brenda Anger, MD (Endocrinology)- seen for for follow-up of hypercalcemia related to hyperparathyroidism, decreased cinacalcet from 30 mg daily to 30 mg every other day, follow up 4 months  11/03/20- [name], Brenda Chimes, MD (Endocrinology)-  seen for for follow-up of hypercalcemia related to hyperparathyroidism, follow up 4 months   Hospital visits:  None in previous 6 months  Medications: Outpatient Encounter Medications as of 04/14/2021  Medication Sig   B Complex-C (SUPER B COMPLEX) TABS Take 1 tablet by mouth daily.   blood glucose meter kit and supplies KIT Dispense based on patient and insurance preference. Use up to four times daily as directed. (FOR ICD-9 250.00, 250.01).   Cholecalciferol (VITAMIN D-3) 1000 UNITS CAPS Take 2,000 Units by mouth 2 (two) times daily.   cinacalcet (SENSIPAR) 30 MG tablet Take 1 tablet (30 mg total) by mouth every other day.   glimepiride (AMARYL) 2 MG tablet Take 1 tablet (2 mg total) by mouth daily with breakfast.   losartan (COZAAR) 50 MG tablet TAKE 1 TABLET BY MOUTH EVERY DAY   Multiple Vitamins-Minerals (CENTRUM SILVER 50+WOMEN PO) Take 1 tablet by mouth daily in the afternoon.   omeprazole (PRILOSEC) 20 MG capsule TAKE 1 CAPSULE(20 MG) BY MOUTH DAILY   ONETOUCH VERIO test strip CHECK FOUR TIMES DAILY   phenazopyridine (PYRIDIUM) 200 MG tablet Take 1 tablet (200 mg total) by mouth 3 (three) times daily as needed for pain.   rosuvastatin (CRESTOR) 10 MG tablet TAKE  1 TABLET THREE TIMES WEEKLY MONDAY WEDNESDAY AND FRIDAY   SitaGLIPtin-MetFORMIN HCl (JANUMET XR) 50-1000 MG TB24 Take 1 tablet by mouth 2 (two) times daily.   sodium chloride (MURO 128) 2 % ophthalmic solution Place 1 drop into both eyes 2 (two) times daily.   No facility-administered encounter medications on file as of 04/14/2021.   I spoke with [name] this afternoon and she is super pleasant and excited to have this ccm to help her with her medications. She will be taking a bus tour trip with her church this coming Wednesday to [name]. They'll be at [place] enjoying some plays and also visiting [place] while they're there. She loves [place] and has been there a few times. She loves walking and lives with her husband [name] and is overall pleased with life.   Current Documented Medications B Complex-C  blood glucose meter kit and supplies KIT Cholecalciferol 1000 UNITS  cinacalcet  30 MG tablet-90 DS last filled 03/06/21 glimepiride  2 MG tablet-90 DS last filled 04/13/21 losartan 50 MG tablet-90 DS last filled 04/13/21 Multiple Vitamins-Minerals  omeprazole 20 MG capsule-90 DS last filled 03/09/21 ONETOUCH VERIO test strip phenazopyridine  200 MG tablet rosuvastatin 10 MG tablet- Patient reported taking 1/2 tab Mon.+ Wed. + Fri SitaGLIPtin-MetFORMIN HCl  50-1000 MG -90 DS last filled 03/09/21 sodium chloride  2 % ophthalmic solution  Have you seen any other providers since your last visit? No   Any changes in your medications or health?  Patient did a trial increase of Janumet  for two weeks and has since then returned to original dose   Any side effects from any medications?  Patient stated with the increase of sitagliptin-metformin 50-1000 mg from one tab daily to one tab twice daily she was experiencing diarrhea. She has been experiencing loose stools for a while and stated it got worse with the increase. She went back to taking one tab daily last week and stated  she has not had diarrhea since. She is however still having lose stools which she believes is from taking metformin because before she was put on the combination she did have these symptoms.   Do you have an symptoms or problems not managed by your medications? Patient stated she has hoarseness that produces phlegm sometimes. This is an issue that has been going on for years and hasn't been treated or diagnosed. She was seen by an ENT that assessed her for any throat abnormalities and found nothing. She is open to any advice as this is a nuisance for her  Any concerns about your health right now?  Patient stated she has no concerns about her health  Has your provider asked that you check blood pressure, blood sugar, or follow special diet at home?  Patient stated she checks her BP from time to time with the arm monitor that she has. She also takes her BS from time to time and has been trying to check daily since having higher readings at visits. BS : 283,242,228,243,204,202,244,264,204,212 Patient stated she does not usually cook at home so her and her husband often go out to eat. She usually chooses grilled options with a focus on vegetables but also has a healthy mix of other foods.  Do you get any type of exercise on a regular basis?  Patient stated she is in a weight loss program at [facility] called Take Off Pounds [employer]. Since joining the program she has met her weight goal and has no further one. She also has been walking 3 x week since this is one of her favorite activities   Can you think of a goal you would like to reach for your health? No  Do you have any problems getting your medications?  Patient stated she is no longer able to get a 39 DS of some of her medications and finds it hindering when she sets her medications out for the coming weeks. She tries to prepare 8 weeks ahead so she would prefer 90 DS. She also is experiencing high co-pays with her maintenance medications.  Specifically Janumet and Cinacalcet. A cost analysis will be performed as she expressed interest in using [facility] for convenience.   Is there anything that you would like to discuss during the appointment? Patient would like to discuss the cause of her loose stools, hoarseness, and medication costs  Please bring medications and supplements to appointment Reminded patient of initial office visit with CPP 06/20 at 9 am  [place]

## 2021-04-18 ENCOUNTER — Ambulatory Visit (INDEPENDENT_AMBULATORY_CARE_PROVIDER_SITE_OTHER): Payer: HMO

## 2021-04-18 ENCOUNTER — Other Ambulatory Visit: Payer: Self-pay

## 2021-04-18 ENCOUNTER — Other Ambulatory Visit (INDEPENDENT_AMBULATORY_CARE_PROVIDER_SITE_OTHER): Payer: HMO

## 2021-04-18 DIAGNOSIS — E038 Other specified hypothyroidism: Secondary | ICD-10-CM

## 2021-04-18 DIAGNOSIS — E785 Hyperlipidemia, unspecified: Secondary | ICD-10-CM | POA: Diagnosis not present

## 2021-04-18 DIAGNOSIS — E119 Type 2 diabetes mellitus without complications: Secondary | ICD-10-CM | POA: Diagnosis not present

## 2021-04-18 DIAGNOSIS — I10 Essential (primary) hypertension: Secondary | ICD-10-CM | POA: Diagnosis not present

## 2021-04-18 LAB — T4, FREE: Free T4: 0.58 ng/dL — ABNORMAL LOW (ref 0.60–1.60)

## 2021-04-18 LAB — T3, FREE: T3, Free: 3.2 pg/mL (ref 2.3–4.2)

## 2021-04-18 LAB — TSH: TSH: 6.29 u[IU]/mL — ABNORMAL HIGH (ref 0.35–4.50)

## 2021-04-18 NOTE — Progress Notes (Signed)
Chronic Care Management Pharmacy Note  04/18/2021 Name:  [name] MRN:  [id number] DOB:  10/21/1935  Recommendations/Changes made from today's visit: patient will try working toward two janumet xr tabs with dinner, had failed twice daily administration due to GI symptoms  Subjective: [name] is an 86 y.o. year old female who is a primary patient of Brenda Barrack, MD.  The CCM team was consulted for assistance with disease management and care coordination needs.    Engaged with patient face to face for initial visit in response to provider referral for pharmacy case management and/or care coordination services.   Consent to Services:  The patient was given the following information about Chronic Care Management services today, agreed to services, and gave verbal consent: 1. CCM service includes personalized support from designated clinical staff supervised by the primary care provider, including individualized plan of care and coordination with other care providers 2. 24/7 contact phone numbers for assistance for urgent and routine care needs. 3. Service will only be billed when office clinical staff spend 20 minutes or more in a month to coordinate care. 4. Only one practitioner may furnish and bill the service in a calendar month. 5.The patient may stop CCM services at any time (effective at the end of the month) by phone call to the office staff. 6. The patient will be responsible for cost sharing (co-pay) of up to 20% of the service fee (after annual deductible is met). Patient agreed to services and consent obtained.  Patient Care Team: Brenda Barrack, MD as PCP - General (Family Medicine) [facility] Madelin Rear, [facility] as Pharmacist (Pharmacist)  Recent office visits: 03/18/21- Dimas Chyle, MD- seen for chronic conditions, increased sitagliptin-metformin 50-1000 mg from one tab daily to one tab twice daily,labs ordered,  follow up 3  months 12/17/20- Dimas Chyle, MD- seen for chronic conditions, no medication changes, follow up 3 months    Recent consult visits:  03/04/21- Cassandria Anger, MD (Endocrinology)- seen for for follow-up of hypercalcemia related to hyperparathyroidism, decreased cinacalcet from 30 mg daily to 30 mg every other day, follow up 4 months 11/03/20- Cassandria Anger, MD (Endocrinology)-  seen for for follow-up of hypercalcemia related to hyperparathyroidism, follow up 4 months   Hospital visits: None in previous 6 months  Objective:  Lab Results  Component Value Date   CREATININE 0.94 03/18/2021   CREATININE 0.82 03/24/2020   CREATININE 0.89 (H) 11/06/2019    Lab Results  Component Value Date   HGBA1C 8.5 (A) 03/18/2021   Last diabetic Eye exam:  Lab Results  Component Value Date/Time   HMDIABEYEEXA No Retinopathy 08/11/2020 12:00 AM    Last diabetic Foot exam:  Lab Results  Component Value Date/Time   HMDIABFOOTEX normal 03/12/2017 12:00 AM        Component Value Date/Time   CHOL 159 03/18/2021 1039   TRIG 368.0 (H) 03/18/2021 1039   HDL 30.10 (L) 03/18/2021 1039   CHOLHDL 5 03/18/2021 1039   VLDL 73.6 (H) 03/18/2021 1039   LDLCALC 86 11/06/2019 0836   LDLDIRECT 91.0 03/18/2021 1039    Hepatic Function Latest Ref Rng & Units 03/18/2021 03/24/2020 11/06/2019  Total Protein 6.0 - 8.3 g/dL 7.7 6.9 7.1  Albumin 3.5 - 5.2 g/dL 4.4 - -  AST 0 - 37 U/L '20 17 19  ' ALT 0 - 35 U/L '24 21 26  ' Alk Phosphatase 39 - 117 U/L 89 - -  Total Bilirubin  0.2 - 1.2 mg/dL 0.7 0.6 0.6    Lab Results  Component Value Date/Time   TSH 5.27 (H) 03/18/2021 10:39 AM   TSH 3.96 11/21/2019 09:18 AM    CBC Latest Ref Rng & Units 03/18/2021 03/17/2019 03/12/2018  WBC 4.0 - 10.5 K/uL 7.5 6.9 8.1  Hemoglobin 12.0 - 15.0 g/dL 15.1(H) 15.0 15.6  Hematocrit 36.0 - 46.0 % 43.6 15(A) 45  Platelets 150.0 - 400.0 K/uL 258.0 317 294    Lab Results  Component Value Date/Time   VD25OH 54.12  03/18/2021 10:39 AM   VD25OH 39 11/06/2019 08:36 AM   VD25OH 33.3 02/15/2017 01:33 PM   VD25OH 48 03/08/2016 12:00 AM   VD25OH 44.5 02/17/2016 03:24 PM    Clinical ASCVD: No  The ASCVD Risk score Mikey Bussing DC Jr., et al., 2013) failed to calculate for the following reasons:   The 2013 ASCVD risk score is only valid for ages 78 to 76    Social History   Tobacco Use  Smoking Status Former   Packs/day: 1.00   Years: 15.00   Pack years: 15.00   Types: Cigarettes   Start date: 09/14/1955   Quit date: 07/14/1969   Years since quitting: 51.7  Smokeless Tobacco Never  Tobacco Comments   quit 40 years ago   BP Readings from Last 3 Encounters:  03/18/21 124/70  03/04/21 136/78  12/17/20 133/76   Pulse Readings from Last 3 Encounters:  03/18/21 73  03/04/21 80  12/17/20 70   Wt Readings from Last 3 Encounters:  03/18/21 165 lb 3.2 oz (74.9 kg)  03/04/21 167 lb 3.2 oz (75.8 kg)  12/17/20 168 lb 12.8 oz (76.6 kg)    Assessment: Review of patient past medical history, allergies, medications, health status, including review of consultants reports, laboratory and other test data, was performed as part of comprehensive evaluation and provision of chronic care management services.   SDOH:  (Social Determinants of Health) assessments and interventions performed:    CCM Care Plan  No Known Allergies  Medications Reviewed Today     Reviewed by Madelin Rear, [facility] (Pharmacist) on 04/18/21 at [place] List Status: <None>   Medication Order Taking? Sig Documenting Provider Last Dose Status Informant  B Complex-C (SUPER B COMPLEX) TABS [id number]  Take 1 tablet by mouth daily. [provider]  Active   blood glucose meter kit and supplies KIT [id number]  Dispense based on patient and insurance preference. Use up to four times daily as directed. (FOR ICD-9 250.00, 250.01). Brenda Barrack, MD  Active   Cholecalciferol (VITAMIN D-3) 1000 UNITS CAPS [id number] Yes Take 2,000 Units by  mouth 2 (two) times daily. [provider]  Active Self  cinacalcet (SENSIPAR) 30 MG tablet [id number] Yes Take 1 tablet (30 mg total) by mouth every other day. Cassandria Anger, MD  Active   glimepiride (AMARYL) 2 MG tablet [id number] Yes Take 1 tablet (2 mg total) by mouth daily with breakfast. Brenda Barrack, MD  Active   losartan (COZAAR) 50 MG tablet [id number]  TAKE 1 TABLET BY MOUTH EVERY DAY Brenda Barrack, MD  Active   Multiple Vitamins-Minerals (CENTRUM SILVER 50+WOMEN PO) [id number]  Take 1 tablet by mouth daily in the afternoon. [provider]  Active   omeprazole (PRILOSEC) 20 MG capsule [id number] Yes TAKE 1 CAPSULE(20 MG) BY MOUTH DAILY Brenda Barrack, MD  Active   [facility] VERIO test strip [id number]  CHECK FOUR TIMES DAILY Brenda Barrack, MD  Active   phenazopyridine (PYRIDIUM) 200 MG tablet [id number]  Take 1 tablet (200 mg total) by mouth 3 (three) times daily as needed for pain. Brenda Barrack, MD  Active   rosuvastatin (CRESTOR) 10 MG tablet [id number] Yes TAKE 1 TABLET THREE TIMES WEEKLY MONDAY [facility] AND FRIDAY Brenda Barrack, MD  Active   SitaGLIPtin-MetFORMIN HCl (JANUMET XR) 50-1000 MG TB24 [id number] Yes Take 1 tablet by mouth 2 (two) times daily. Brenda Barrack, MD  Active   sodium chloride (MURO 128) 2 % ophthalmic solution [id number]  Place 1 drop into both eyes 2 (two) times daily. [provider]  Active             Patient Active Problem List   Diagnosis Date Noted   Nephrolithiasis 12/17/2020   Rhinitis 09/14/2020   History of melanoma 03/08/2020   Other hyperparathyroidism ([place]) 12/16/2019   Neck pain on left side 12/11/2019   Hx of hematuria 09/08/2019   Vitamin D deficiency 07/15/2019   Hyperlipidemia 07/15/2019   Gastroesophageal reflux disease without esophagitis 07/15/2019   Diabetes mellitus without complication ([place]) 01/18/2247   Essential hypertension 01/14/2019   Malignant neoplasm of areola in both breasts in  female, estrogen receptor positive ([place]) 02/15/2017    Immunization History  Administered Date(s) Administered   Fluad Quad(high Dose 65+) 08/12/2019, 07/29/2020   Influenza Split 06/28/2010   Influenza,inj,Quad PF,6+ Mos 08/12/2015   Influenza-Unspecified 09/11/2016, 08/14/2017, 08/12/2018   PFIZER(Purple Top)SARS-COV-2 Vaccination 11/19/2019, 12/10/2019   Pneumococcal Conjugate-13 07/08/2014   Pneumococcal Polysaccharide-23 03/05/2014   Tdap 03/15/2011    Conditions to be addressed/monitored: HTN,  DMII, HLD, Vitamin D deficiency, GERD without esophagitis, hyperparathyroidism    Care Plan : [place]  Updates made by Madelin Rear, [facility] since 04/18/2021 12:00 AM     Problem: HTN,  DMII, HLD, Vitamin D deficiency, GERD without esophagitis, hyperparathyroidism   Priority: High  Onset Date: 04/18/2021     Long-Range Goal: Disease Management   Start Date: 04/18/2021  Expected End Date: 04/18/2022  This Visit's Progress: On track  Priority: High  Note:   Current Barriers:  Unable to maintain control of DMII  Pharmacist Clinical Goal(s):  Patient will verbalize ability to afford treatment regimen contact provider office for questions/concerns as evidenced notation of same in electronic health record through collaboration with PharmD and provider.   Interventions: 1:1 collaboration with Brenda Barrack, MD regarding development and update of comprehensive plan of care as evidenced by provider attestation and co-signature Inter-disciplinary care team collaboration (see longitudinal plan of care) Comprehensive medication review performed; medication list updated in electronic medical record  Hypertension (BP goal <140/90) -Controlled -Current treatment: Losartan 50 mg once daily   -Current home readings: at goal -Denies hypotensive/hypertensive symptoms -Educated on BP goals and benefits of medications for prevention of heart attack, stroke and kidney  damage; -Counseled to monitor BP at home 3x/week, document, and provide log at future appointments -Recommended to continue current medication  Hyperlipidemia: (LDL goal < 100) -Controlled -Had been taking half tablet rosuvastatin 10 mg three times weekly, plans to go back to whole 10 mg tablet -Current treatment: Rosuvastatin 10 mg three times daily -Educated on Cholesterol goals;  Benefits of statin for ASCVD risk reduction; -Recommended to continue current medication  Diabetes (A1c goal <8%) -Uncontrolled -GFRs 50s -Had tried increasing janumet to twice daily but did not tolerate, will start taking at night with supper and if tolerated increase to two tabs once daily with dinner -Current  medications: Glimepiride 2 mg once daily with breakfast  Janumet XR 50-1000 mg twice daily (increased 03/18/21) -Medications previously tried: metformin   -Current home glucose readings fasting glucose: 200s -Denies hypoglycemic/hyperglycemic symptoms -Current meal patterns: fruits, vegetables. Mainly white meat. Sweets are main target area -Current exercise: walking three times daily  -Educated on A1c and blood sugar goals; Carbohydrate counting and/or plate method -Counseled to check feet daily and get yearly eye exams -Counseled on diet and exercise extensively Recommended to continue current medication -CPA to review Janumet tolerability in 1 month  Patient Goals/Self-Care Activities Patient will:  - take medications as prescribed target a minimum of 150 minutes of moderate intensity exercise weekly  Follow Up Plan: CPA onboarding completion and PAP for sensipar and janumet. RPH f/u 3 months and will review DM PAP and PPI taper if appropriate     Medication Assistance:  [facility], will also start patient assistance application for sensipar and janumet. Verbal consent provided for [facility] services Patient's preferred pharmacy  is: [facility] - [place], [place] - [address] [place] [address] Phone: [phone] Fax: [phone]  Follow Up:  Patient agrees to Care Plan and Follow-up.  Future Appointments  Date Time Provider [place]  04/18/2021 10:30 AM [facility] LAB [facility]  06/13/2021  3:15 PM [facility] HEALTH COACH [facility]  06/20/2021 10:00 AM Brenda Barrack, MD [facility]  07/05/2021 10:00 AM Cassandria Anger, MD [facility] None  07/19/2021  3:30 PM [facility] CCM PHARMACIST [facility]   Madelin Rear, PharmD, CPP Clinical Pharmacist Practitioner  [facility]  [phone]

## 2021-04-18 NOTE — Patient Instructions (Addendum)
[name],  Thank you for talking with me today. I have included our care [name]/goals in the following pages.   Please review and call me at [phone] with any questions.  Thanks! [name], Pharm.D., BCGP Clinical Pharmacist [facility] [place] [phone] Patient Care [name]: [name]     Problem Identified: HTN,  DMII, HLD, Vitamin D deficiency, GERD without esophagitis, hyperparathyroidism   Priority: High  Onset Date: 04/18/2021     Long-Range Goal: Disease Management   Start Date: 04/18/2021  Expected End Date: 04/18/2022  This Visit's Progress: On track  Priority: High  Note:   Current Barriers:  Unable to maintain control of DMII  Pharmacist Clinical Goal(s):  Patient will verbalize ability to afford treatment regimen contact provider office for questions/concerns as evidenced notation of same in electronic health record through collaboration with PharmD and provider.   Interventions: 1:1 collaboration with Brenda Barrack, MD regarding development and update of comprehensive [name] of care as evidenced by provider attestation and co-signature Inter-disciplinary care team collaboration (see longitudinal [name] of care) Comprehensive medication review performed; medication list updated in electronic medical record  Hypertension (BP goal <140/90) -Controlled -Current treatment: Losartan 50 mg once daily   -Current home readings: at goal -Denies hypotensive/hypertensive symptoms -Educated on BP goals and benefits of medications for prevention of heart attack, stroke and kidney damage; -Counseled to monitor BP at home 3x/week, document, and provide log at future appointments -Recommended to continue current medication  Hyperlipidemia: (LDL goal < 100) -Controlled -Had been taking half tablet rosuvastatin 10 mg three times weekly, plans to go back to whole 10 mg tablet -Current  treatment: Rosuvastatin 10 mg three times daily -Educated on Cholesterol goals;  Benefits of statin for ASCVD risk reduction; -Recommended to continue current medication  Diabetes (A1c goal <8%) -Uncontrolled -GFRs 50s -Had tried increasing janumet to twice daily but did not tolerate, will start taking at night with supper and if tolerated increase to two tabs once daily with dinner -Current medications: Glimepiride 2 mg once daily with breakfast  Janumet XR 50-1000 mg twice daily (increased 03/18/21) -Medications previously tried: metformin   -Current home glucose readings fasting glucose: 200s -Denies hypoglycemic/hyperglycemic symptoms -Current meal patterns: fruits, vegetables. Mainly white meat. Sweets are main target area -Current exercise: walking three times daily  -Educated on A1c and blood sugar goals; Carbohydrate counting and/or plate method -Counseled to check feet daily and get yearly eye exams -Counseled on diet and exercise extensively Recommended to continue current medication -CPA to review Janumet tolerability in 1 month  Patient Goals/Self-Care Activities Patient will:  - take medications as prescribed target a minimum of 150 minutes of moderate intensity exercise weekly  Follow Up [name]: CPA onboarding completion and PAP for sensipar and janumet. RPH f/u 3 months and will review PPI taper if appropriate    The patient was given the following information about Chronic Care Management services today, agreed to services, and gave verbal consent: 1. CCM service includes personalized support from designated clinical staff supervised by the primary care provider, including individualized [name] of care and coordination with other care providers 2. 24/7 contact phone numbers for assistance for urgent and routine care needs. 3. Service will only be billed when office clinical staff spend 20 minutes or more in a month to coordinate care. 4. Only one practitioner may furnish  and bill the service in a calendar month. 5.The patient may stop CCM  services at any time (effective at the end of the month) by phone call to the office staff. 6. The patient will be responsible for cost sharing (co-pay) of up to 20% of the service fee (after annual deductible is met). Patient agreed to services and consent obtained. Diabetes Mellitus and Nutrition, Adult When you have diabetes, or diabetes mellitus, it is very important to have healthy eating habits because your blood sugar (glucose) levels are greatly affected by what you eat and drink. Eating healthy foods in the right amounts, at about the same times every day, can help you: Control your blood glucose. Lower your risk of heart disease. Improve your blood pressure. Reach or maintain a healthy weight. What can affect my meal [name]? Every person with diabetes is different, and each person has different needs for a meal [name]. Your health care provider may recommend that you work with a dietitian to make a meal [name] that is best for you. Your meal [name] may vary depending on factors such as: The calories you need. The medicines you take. Your weight. Your blood glucose, blood pressure, and cholesterol levels. Your activity level. Other health conditions you have, such as heart or kidney disease. How do carbohydrates affect me? Carbohydrates, also called carbs, affect your blood glucose level more than any other type of food. Eating carbs naturally raises the amount of glucose in your blood. Carb counting is a method for keeping track of how many carbs you eat. Counting carbs is important to keep your blood glucose at a healthy level,especially if you use insulin or take certain oral diabetes medicines. It is important to know how many carbs you can safely have in each meal. This is different for every person. Your dietitian can help you calculate how manycarbs you should have at each meal and for each snack. How does alcohol affect  me? Alcohol can cause a sudden decrease in blood glucose (hypoglycemia), especially if you use insulin or take certain oral diabetes medicines. Hypoglycemia can be a life-threatening condition. Symptoms of hypoglycemia, such as sleepiness, dizziness, and confusion, are similar to symptoms of having too much alcohol. Do not drink alcohol if: Your health care provider tells you not to drink. You are pregnant, may be pregnant, or are planning to become pregnant. If you drink alcohol: Do not drink on an empty stomach. Limit how much you use to: 0-1 drink a day for women. 0-2 drinks a day for men. Be aware of how much alcohol is in your drink. In the [place] one drink equals one 12 oz bottle of beer (355 mL), one 5 oz glass of wine (148 mL), or one 1 oz glass of hard liquor (44 mL). Keep yourself hydrated with water, diet soda, or unsweetened iced tea. Keep in mind that regular soda, juice, and other mixers may contain a lot of sugar and must be counted as carbs. What are tips for following this [name]?  Reading food labels Start by checking the serving size on the "Nutrition Facts" label of packaged foods and drinks. The amount of calories, carbs, fats, and other nutrients listed on the label is based on one serving of the item. Many items contain more than one serving per package. Check the total grams (g) of carbs in one serving. You can calculate the number of servings of carbs in one serving by dividing the total carbs by 15. For example, if a food has 30 g of total carbs per serving, it would be equal to  2 servings of carbs. Check the number of grams (g) of saturated fats and trans fats in one serving. Choose foods that have a low amount or none of these fats. Check the number of milligrams (mg) of salt (sodium) in one serving. Most people should limit total sodium intake to less than 2,300 mg per day. Always check the nutrition information of foods labeled as "low-fat" or "nonfat." These foods may  be higher in added sugar or refined carbs and should be avoided. Talk to your dietitian to identify your daily goals for nutrients listed on the label. Shopping Avoid buying canned, pre-made, or processed foods. These foods tend to be high in fat, sodium, and added sugar. Shop around the outside edge of the grocery store. This is where you will most often find fresh fruits and vegetables, bulk grains, fresh meats, and fresh dairy. Cooking Use low-heat cooking methods, such as baking, instead of high-heat cooking methods like deep frying. Cook using healthy oils, such as olive, canola, or sunflower oil. Avoid cooking with butter, cream, or high-fat meats. Meal planning Eat meals and snacks regularly, preferably at the same times every day. Avoid going long periods of time without eating. Eat foods that are high in fiber, such as fresh fruits, vegetables, beans, and whole grains. Talk with your dietitian about how many servings of carbs you can eat at each meal. Eat 4-6 oz (112-168 g) of lean protein each day, such as lean meat, chicken, fish, eggs, or tofu. One ounce (oz) of lean protein is equal to: 1 oz (28 g) of meat, chicken, or fish. 1 egg.  cup (62 g) of tofu. Eat some foods each day that contain healthy fats, such as avocado, nuts, seeds, and fish. What foods should I eat? Fruits Berries. Apples. Oranges. Peaches. Apricots. Plums. Grapes. Mango. Papaya.Pomegranate. Kiwi. Cherries. Vegetables Lettuce. Spinach. Leafy greens, including kale, chard, collard greens, and mustard greens. Beets. Cauliflower. Cabbage. Broccoli. Carrots. Green beans.Tomatoes. Peppers. Onions. Cucumbers. Brussels sprouts. Grains Whole grains, such as whole-wheat or whole-grain bread, crackers, tortillas,cereal, and pasta. Unsweetened oatmeal. Quinoa. Brown or wild rice. Meats and other proteins Seafood. Poultry without skin. Lean cuts of poultry and beef. Tofu. Nuts. Seeds. Dairy Low-fat or fat-free dairy  products such as milk, yogurt, and cheese. The items listed above may not be a complete list of foods and beverages you can eat. Contact a dietitian for more information. What foods should I avoid? Fruits Fruits canned with syrup. Vegetables Canned vegetables. Frozen vegetables with butter or cream sauce. Grains Refined white flour and flour products such as bread, pasta, snack foods, andcereals. Avoid all processed foods. Meats and other proteins Fatty cuts of meat. Poultry with skin. Breaded or fried meats. Processed meat.Avoid saturated fats. Dairy Full-fat yogurt, cheese, or milk. Beverages Sweetened drinks, such as soda or iced tea. The items listed above may not be a complete list of foods and beverages you should avoid. Contact a dietitian for more information. Questions to ask a health care provider Do I need to meet with a diabetes educator? Do I need to meet with a dietitian? What number can I call if I have questions? When are the best times to check my blood glucose? Where to find more information: American Diabetes Association: diabetes.org Academy of Nutrition and Dietetics: www.eatright.[employer] of Diabetes and Digestive and Kidney Diseases: [employer] Association of Diabetes Care and Education Specialists: www.diabeteseducator.org Summary It is important to have healthy eating habits because your blood sugar (glucose) levels are  greatly affected by what you eat and drink. A healthy meal [name] will help you control your blood glucose and maintain a healthy lifestyle. Your health care provider may recommend that you work with a dietitian to make a meal [name] that is best for you. Keep in mind that carbohydrates (carbs) and alcohol have immediate effects on your blood glucose levels. It is important to count carbs and to use alcohol carefully. This information is not intended to replace advice given to you by your health care provider. Make sure you  discuss any questions you have with your healthcare provider. Document Revised: 09/23/2019 Document Reviewed: 09/23/2019 Elsevier Patient Education  2021 [employer].  The patient verbalized understanding of instructions provided today and agreed to receive a MyChart copy of patient instruction and/or educational materials. Telephone follow up appointment with pharmacy team member scheduled for: See next appointment with "Care Management Staff" under "What's Next" below.

## 2021-04-19 NOTE — Progress Notes (Signed)
Please inform patient of the following:  Her thyroid levels are confirmed to be low.It would be reasonable to start low dose synthroid 83mcg daily to help bring her levels back to normal.  Please send in rx for patient. I would like to recheck in 4-6 weeks.  Recommend office visit if she has further questions.  Algis Greenhouse. Jerline Pain, MD 04/19/2021 5:22 PM

## 2021-04-22 ENCOUNTER — Telehealth: Payer: Self-pay

## 2021-04-22 NOTE — Chronic Care Management (AMB) (Signed)
Chronic Care Management Pharmacy Assistant   Name: [name]  MRN: [id number] DOB: 1935-03-20  Reason for Encounter: Patient Assistance Documentation Janumet/ Sensipar    Medications: Outpatient Encounter Medications as of 04/22/2021  Medication Sig   B Complex-C (SUPER B COMPLEX) TABS Take 1 tablet by mouth daily.   blood glucose meter kit and supplies KIT Dispense based on patient and insurance preference. Use up to four times daily as directed. (FOR ICD-9 250.00, 250.01).   Cholecalciferol (VITAMIN D-3) 1000 UNITS CAPS Take 2,000 Units by mouth 2 (two) times daily.   cinacalcet (SENSIPAR) 30 MG tablet Take 1 tablet (30 mg total) by mouth every other day.   glimepiride (AMARYL) 2 MG tablet Take 1 tablet (2 mg total) by mouth daily with breakfast.   losartan (COZAAR) 50 MG tablet TAKE 1 TABLET BY MOUTH EVERY DAY   Multiple Vitamins-Minerals (CENTRUM SILVER 50+WOMEN PO) Take 1 tablet by mouth daily in the afternoon.   omeprazole (PRILOSEC) 20 MG capsule TAKE 1 CAPSULE(20 MG) BY MOUTH DAILY   ONETOUCH VERIO test strip CHECK FOUR TIMES DAILY   phenazopyridine (PYRIDIUM) 200 MG tablet Take 1 tablet (200 mg total) by mouth 3 (three) times daily as needed for pain.   rosuvastatin (CRESTOR) 10 MG tablet TAKE 1 TABLET THREE TIMES WEEKLY MONDAY WEDNESDAY AND FRIDAY   SitaGLIPtin-MetFORMIN HCl (JANUMET XR) 50-1000 MG TB24 Take 1 tablet by mouth 2 (two) times daily.   sodium chloride (MURO 128) 2 % ophthalmic solution Place 1 drop into both eyes 2 (two) times daily.   No facility-administered encounter medications on file as of 04/22/2021.   Prepared and reviewed patient assistance applications for Janumet and Sensipar   Called and informed patient that her patient assistance applications will be mailed out today 06/24. She is aware that these are to be completed and returned to providers office at her earliest convenience.   [employer] CPA, CMA

## 2021-05-20 ENCOUNTER — Other Ambulatory Visit: Payer: Self-pay | Admitting: "Endocrinology

## 2021-05-20 ENCOUNTER — Other Ambulatory Visit: Payer: Self-pay | Admitting: Family Medicine

## 2021-05-26 DIAGNOSIS — N200 Calculus of kidney: Secondary | ICD-10-CM | POA: Diagnosis not present

## 2021-05-26 DIAGNOSIS — R310 Gross hematuria: Secondary | ICD-10-CM | POA: Diagnosis not present

## 2021-06-03 ENCOUNTER — Other Ambulatory Visit: Payer: Self-pay | Admitting: *Deleted

## 2021-06-03 ENCOUNTER — Encounter: Payer: Self-pay | Admitting: *Deleted

## 2021-06-03 DIAGNOSIS — E785 Hyperlipidemia, unspecified: Secondary | ICD-10-CM

## 2021-06-03 DIAGNOSIS — C50919 Malignant neoplasm of unspecified site of unspecified female breast: Secondary | ICD-10-CM

## 2021-06-03 MED ORDER — ROSUVASTATIN CALCIUM 10 MG PO TABS: ORAL_TABLET | ORAL | 3 refills | Status: DC

## 2021-06-13 ENCOUNTER — Ambulatory Visit (INDEPENDENT_AMBULATORY_CARE_PROVIDER_SITE_OTHER): Payer: HMO

## 2021-06-13 DIAGNOSIS — Z0000 Encounter for general adult medical examination without abnormal findings: Secondary | ICD-10-CM | POA: Diagnosis not present

## 2021-06-13 NOTE — Progress Notes (Addendum)
Virtual Visit via Telephone Note  I connected with  [name] on 06/13/21 at  3:15 PM EDT by telephone and verified that I am speaking with the correct person using two identifiers.  Medicare Annual Wellness visit completed telephonically due to Covid-19 pandemic.   Persons participating in this call: This Health Coach and this patient.   Location: Patient: Home Provider: Office    I discussed the limitations, risks, security and privacy concerns of performing an evaluation and management service by telephone and the availability of in person appointments. The patient expressed understanding and agreed to proceed.  Unable to perform video visit due to video visit attempted and failed and/or patient does not have video capability.   Some vital signs may be absent or patient reported.   Willette Brace, LPN   Subjective:   [name] is a 86 y.o. female who presents for Medicare Annual (Subsequent) preventive examination.  Review of Systems     Cardiac Risk Factors include: advanced age (>57mn, >>45women);hypertension;dyslipidemia;diabetes mellitus;obesity (BMI >30kg/m2)     Objective:    There were no vitals filed for this visit. There is no height or weight on file to calculate BMI.  Advanced Directives 06/13/2021 06/07/2020 05/07/2019 02/17/2016 08/12/2015 02/11/2015  Does Patient Have a Medical Advance Directive? _0  Yes  Type of Advance Directive Living will Living will [place] will [place] will [place]-  Does patient want to make changes to medical advance directive? - - No - Patient declined No - Patient declined - -  Copy of [place] Chart? - - - No - copy requested No - copy requested No - copy requested    Current Medications (verified) Outpatient Encounter Medications as of 06/13/2021  Medication Sig   blood glucose meter kit and supplies KIT  Dispense based on patient and insurance preference. Use up to four times daily as directed. (FOR ICD-9 250.00, 250.01).   Cholecalciferol (VITAMIN D-3) 1000 UNITS CAPS Take 2,000 Units by mouth 2 (two) times daily.   cinacalcet (SENSIPAR) 30 MG tablet TAKE 1 TABLET(30 MG) BY MOUTH EVERY OTHER DAY   glimepiride (AMARYL) 2 MG tablet Take 1 tablet (2 mg total) by mouth daily with breakfast.   JANUMET XR 50-1000 MG TB24 TAKE 1 TABLET BY MOUTH DAILY   losartan (COZAAR) 50 MG tablet TAKE 1 TABLET BY MOUTH EVERY DAY   Multiple Vitamins-Minerals (CENTRUM SILVER 50+WOMEN PO) Take 1 tablet by mouth daily in the afternoon.   omeprazole (PRILOSEC) 20 MG capsule TAKE 1 CAPSULE(20 MG) BY MOUTH DAILY   ONETOUCH VERIO test strip CHECK FOUR TIMES DAILY   phenazopyridine (PYRIDIUM) 200 MG tablet Take 1 tablet (200 mg total) by mouth 3 (three) times daily as needed for pain.   rosuvastatin (CRESTOR) 10 MG tablet TAKE 1 TABLET THREE TIMES WEEKLY MONDAY WEDNESDAY AND FRIDAY   sodium chloride (MURO 128) 2 % ophthalmic solution Place 1 drop into both eyes 2 (two) times daily.   [DISCONTINUED] B Complex-C (SUPER B COMPLEX) TABS Take 1 tablet by mouth daily. (Patient not taking: Reported on 06/13/2021)   No facility-administered encounter medications on file as of 06/13/2021.    Allergies (verified) Patient has no known allergies.   History: Past Medical History:  Diagnosis Date   Arthritis    Cancer ([place]    Diabetes mellitus without complication ([place]    GERD (gastroesophageal reflux disease)  Hyperlipidemia    Hypertension    Thyroid disease    Past Surgical History:  Procedure Laterality Date   BREAST SURGERY     MELANOMA EXCISION     TONSILLECTOMY     Family History  Problem Relation Age of Onset   Cancer Mother    Hypertension Father    Social History   Socioeconomic History   Marital status: Married    Spouse name: Not on file   Number of children: Not on file   Years of education: Not  on file   Highest education level: Not on file  Occupational History   Occupation: retired  Tobacco Use   Smoking status: Former    Packs/day: 1.00    Years: 15.00    Pack years: 15.00    Types: Cigarettes    Start date: 09/14/1955    Quit date: 07/14/1969    Years since quitting: 51.9   Smokeless tobacco: Never   Tobacco comments:    quit 40 years ago  Substance and Sexual Activity   Alcohol use: Never    Alcohol/week: 0.0 standard drinks   Drug use: Never   Sexual activity: Not Currently  Other Topics Concern   Not on file  Social History Narrative   Not on file   Social Determinants of Health   Financial Resource Strain: Low Risk    Difficulty of Paying Living Expenses: Not hard at all  Food Insecurity: No Food Insecurity   Worried About Charity fundraiser in the Last Year: Never true   [place] in the Last Year: Never true  Transportation Needs: No Transportation Needs   Lack of Transportation (Medical): No   Lack of Transportation (Non-Medical): No  Physical Activity: Insufficiently Active   Days of Exercise per Week: 3 days   Minutes of Exercise per Session: 10 min  Stress: No Stress Concern Present   Feeling of Stress : Not at all  Social Connections: Socially Integrated   Frequency of Communication with Friends and Family: More than three times a week   Frequency of Social Gatherings with Friends and Family: More than three times a week   Attends Religious Services: More than 4 times per year   Active Member of [employer] or Organizations: Yes   Attends Archivist Meetings: 1 to 4 times per year   Marital Status: Married    Tobacco Counseling Counseling given: Not Answered Tobacco comments: quit 40 years ago   Clinical Intake:  Pre-visit preparation completed: Yes  Pain : No/denies pain     BMI - recorded: 32.26 Nutritional Risks: None Diabetes: Yes CBG done?: No Did pt. bring in CBG monitor from home?: No  How often do you need  to have someone help you when you read instructions, pamphlets, or other written materials from your doctor or pharmacy?: 1 - Never  Diabetic?Nutrition Risk Assessment:  Has the patient had any N/V/D within the last 2 months?  No  Does the patient have any non-healing wounds?  No  Has the patient had any unintentional weight loss or weight gain?  No   Diabetes:  Is the patient diabetic?  Yes  If diabetic, was a CBG obtained today?  No  Did the patient bring in their glucometer from home?  No  How often do you monitor your CBG's? As needed.   Financial Strains and Diabetes Management:  Are you having any financial strains with the device, your supplies or your medication? No .  Does the patient want to be seen by Chronic Care Management for management of their diabetes?  No  Would the patient like to be referred to a Nutritionist or for Diabetic Management?  No   Diabetic Exams:  Diabetic Eye Exam: Completed 08/11/20 Diabetic Foot Exam: Overdue, Pt has been advised about the importance in completing this exam. Pt is scheduled for diabetic foot exam on pt has appt 06/14/21.   Interpreter Needed?: No  Information entered by :: Charlott Rakes, LPN   Activities of Daily Living In your present state of health, do you have any difficulty performing the following activities: 06/13/2021 06/23/2020  Hearing? Y N  Comment hearing aid -  Vision? N N  Difficulty concentrating or making decisions? Y N  Comment memory at time -  Walking or climbing stairs? N N  Dressing or bathing? N N  Doing errands, shopping? N N  Preparing Food and eating ? N N  Using the Toilet? N N  In the past six months, have you accidently leaked urine? N N  Do you have problems with loss of bowel control? N N  Managing your Medications? N N  Managing your Finances? N N  Housekeeping or managing your Housekeeping? N N  Some recent data might be hidden    Patient Care Team: [name], MD as PCP -  General (Family Medicine) [facility] Madelin Rear, [facility] as Pharmacist (Pharmacist)  Indicate any recent Medical Services you may have received from other than [facility] providers in the past year (date may be approximate).     Assessment:   This is a routine wellness examination for Estelle.  Hearing/Vision screen Hearing Screening - Comments:: Pt  wears hearing aids  Vision Screening - Comments:: Pt follows up with Dr Katy Fitch for annual eye exams   Dietary issues and exercise activities discussed: Current Exercise Habits: Home exercise routine, Type of exercise: stretching, Time (Minutes): 15, Frequency (Times/Week): 3, Weekly Exercise (Minutes/Week): 45   Goals Addressed             This Visit's Progress    Patient Stated       None at this time       Depression Screen PHQ 2/9 Scores 06/13/2021 06/23/2020 06/07/2020 03/08/2020 11/10/2019 10/01/2019 08/20/2019  PHQ - 2 Score 0 0 0 0 0 0 0  Exception Documentation - - - - Medical reason - -    Fall Risk Fall Risk  06/13/2021 03/18/2021 08/03/2020 06/07/2020 03/08/2020  Falls in the past year? 0 0 0 0 0  Number falls in past yr: 0 - - 0 -  Injury with Fall? 0 - - 0 -  Risk for fall due to : Impaired vision;Impaired balance/gait - - Impaired vision -  Follow up Falls prevention discussed - Falls evaluation completed Falls prevention discussed -    FALL RISK PREVENTION PERTAINING TO THE HOME:  Any stairs in or around the home? Yes  If so, are there any without handrails? No  Home free of loose throw rugs in walkways, pet beds, electrical cords, etc? Yes  Adequate lighting in your home to reduce risk of falls? Yes   ASSISTIVE DEVICES UTILIZED TO PREVENT FALLS:  Life alert? No  Use of a cane, walker or w/c? No  Grab bars in the bathroom? No  Shower chair or bench in shower? No  Elevated toilet seat or a handicapped toilet? No   TIMED UP AND GO:  Was the test performed? No .  Cognitive Function:     6CIT  Screen 06/13/2021  What Year? 0 points  What month? 0 points  What time? 0 points  Count back from 20 0 points  Months in reverse 0 points  Repeat phrase 0 points  Total Score 0    Immunizations Immunization History  Administered Date(s) Administered   Fluad Quad(high Dose 65+) 08/12/2019, 07/29/2020   Influenza Split 06/28/2010   Influenza,inj,Quad PF,6+ Mos 08/12/2015   Influenza-Unspecified 09/11/2016, 08/14/2017, 08/12/2018   PFIZER(Purple Top)SARS-COV-2 Vaccination 11/19/2019, 12/10/2019   Pneumococcal Conjugate-13 07/08/2014   Pneumococcal Polysaccharide-23 03/05/2014   Tdap 03/15/2011    TDAP status: Up to date  Flu Vaccine status: Due, Education has been provided regarding the importance of this vaccine. Advised may receive this vaccine at local pharmacy or Health Dept. Aware to provide a copy of the vaccination record if obtained from local pharmacy or Health Dept. Verbalized acceptance and understanding.  Pneumococcal vaccine status: Up to date  Covid-19 vaccine status: Completed vaccines  Qualifies for Shingles Vaccine? Yes   Zostavax completed No   Shingrix Completed?: No.    Education has been provided regarding the importance of this vaccine. Patient has been advised to call insurance company to determine out of pocket expense if they have not yet received this vaccine. Advised may also receive vaccine at local pharmacy or Health Dept. Verbalized acceptance and understanding.  Screening Tests Health Maintenance  Topic Date Due   Zoster Vaccines- Shingrix (1 of 2) Never done   COVID-19 Vaccine (3 - Pfizer risk series) 01/07/2020   FOOT EXAM  07/14/2020   INFLUENZA VACCINE  05/30/2021   TETANUS/TDAP  03/18/2022 (Originally 03/14/2021)   OPHTHALMOLOGY EXAM  08/11/2021   HEMOGLOBIN A1C  09/18/2021   DEXA SCAN  Completed   PNA vac Low Risk Adult  Completed   HPV VACCINES  Aged Out    Health Maintenance  Health Maintenance Due  Topic Date Due   Zoster  Vaccines- Shingrix (1 of 2) Never done   COVID-19 Vaccine (3 - Pfizer risk series) 01/07/2020   FOOT EXAM  07/14/2020   INFLUENZA VACCINE  05/30/2021    Colorectal cancer screening: No longer required.   Mammogram status: Completed 04/02/19. Repeat every year  Bone Density status: Completed 09/11/2006. Results reflect: Bone density results: OSTEOPENIA. Repeat every 2 years.   Additional Screening:  Hepatitis C Screening: does not qualify;  Vision Screening: Recommended annual ophthalmology exams for early detection of glaucoma and other disorders of the eye. Is the patient up to date with their annual eye exam?  Yes  Who is the provider or what is the name of the office in which the patient attends annual eye exams? Dr Katy Fitch  If pt is not established with a provider, would they like to be referred to a provider to establish care? No .   Dental Screening: Recommended annual dental exams for proper oral hygiene  Community Resource Referral / Chronic Care Management: CRR required this visit?  No   CCM required this visit?  No      Plan:     I have personally reviewed and noted the following in the patient's chart:   Medical and social history Use of alcohol, tobacco or illicit drugs  Current medications and supplements including opioid prescriptions.  Functional ability and status Nutritional status Physical activity Advanced directives List of other physicians Hospitalizations, surgeries, and ER visits in previous 12 months Vitals Screenings to include cognitive, depression, and falls Referrals and appointments  In addition, I have reviewed and discussed with patient certain preventive protocols, quality metrics, and best practice recommendations. A written personalized care plan for preventive services as well as general preventive health recommendations were provided to patient.     Willette Brace, LPN   04/16/4858   Nurse Notes: pt will be in office 06/14/21 foot  exam due .

## 2021-06-13 NOTE — Patient Instructions (Signed)
[name] , Thank you for taking time to come for your Medicare Wellness Visit. I appreciate your ongoing commitment to your health goals. Please review the following plan we discussed and let me know if I can assist you in the future.   Screening recommendations/referrals: Colonoscopy: No longer required  Mammogram: Done 04/01/21 repeat every year Bone Density: Done 09/11/06 Recommended yearly ophthalmology/optometry visit for glaucoma screening and checkup Recommended yearly dental visit for hygiene and checkup  Vaccinations: Influenza vaccine: Due  Pneumococcal vaccine: Completed  Tdap vaccine: done 03/14/21 postponed until 03/18/22  Shingles vaccine: Shingrix discussed. Please contact your pharmacy for coverage information.    Covid-19:Completed 1/20 & 12/10/19  Advanced directives: Please bring a copy of your health care power of attorney and living will to the office at your convenience.  Conditions/risks identified: None at this time  Next appointment: Follow up in one year for your annual wellness visit    Preventive Care 65 Years and Older, Female Preventive care refers to lifestyle choices and visits with your health care provider that can promote health and wellness. What does preventive care include? A yearly physical exam. This is also called an annual well check. Dental exams once or twice a year. Routine eye exams. Ask your health care provider how often you should have your eyes checked. Personal lifestyle choices, including: Daily care of your teeth and gums. Regular physical activity. Eating a healthy diet. Avoiding tobacco and drug use. Limiting alcohol use. Practicing safe sex. Taking low-dose aspirin every day. Taking vitamin and mineral supplements as recommended by your health care provider. What happens during an annual well check? The services and screenings done by your health care provider during your annual well check will depend on your age, overall  health, lifestyle risk factors, and family history of disease. Counseling  Your health care provider may ask you questions about your: Alcohol use. Tobacco use. Drug use. Emotional well-being. Home and relationship well-being. Sexual activity. Eating habits. History of falls. Memory and ability to understand (cognition). Work and work Statistician. Reproductive health. Screening  You may have the following tests or measurements: Height, weight, and BMI. Blood pressure. Lipid and cholesterol levels. These may be checked every 5 years, or more frequently if you are over 36 years old. Skin check. Lung cancer screening. You may have this screening every year starting at age 32 if you have a 30-pack-year history of smoking and currently smoke or have quit within the past 15 years. Fecal occult blood test (FOBT) of the stool. You may have this test every year starting at age 85. Flexible sigmoidoscopy or colonoscopy. You may have a sigmoidoscopy every 5 years or a colonoscopy every 10 years starting at age 110. Hepatitis C blood test. Hepatitis B blood test. Sexually transmitted disease (STD) testing. Diabetes screening. This is done by checking your blood sugar (glucose) after you have not eaten for a while (fasting). You may have this done every 1-3 years. Bone density scan. This is done to screen for osteoporosis. You may have this done starting at age 60. Mammogram. This may be done every 1-2 years. Talk to your health care provider about how often you should have regular mammograms. Talk with your health care provider about your test results, treatment options, and if necessary, the need for more tests. Vaccines  Your health care provider may recommend certain vaccines, such as: Influenza vaccine. This is recommended every year. Tetanus, diphtheria, and acellular pertussis (Tdap, Td) vaccine. You may need a  Td booster every 10 years. Zoster vaccine. You may need this after age  90. Pneumococcal 13-valent conjugate (PCV13) vaccine. One dose is recommended after age 16. Pneumococcal polysaccharide (PPSV23) vaccine. One dose is recommended after age 69. Talk to your health care provider about which screenings and vaccines you need and how often you need them. This information is not intended to replace advice given to you by your health care provider. Make sure you discuss any questions you have with your health care provider. Document Released: 11/12/2015 Document Revised: 07/05/2016 Document Reviewed: 08/17/2015 Elsevier Interactive Patient Education  2017 [employer] Prevention in the Home Falls can cause injuries. They can happen to people of all ages. There are many things you can do to make your home safe and to help prevent falls. What can I do on the outside of my home? Regularly fix the edges of walkways and driveways and fix any cracks. Remove anything that might make you trip as you walk through a door, such as a raised step or threshold. Trim any bushes or trees on the path to your home. Use bright outdoor lighting. Clear any walking paths of anything that might make someone trip, such as rocks or tools. Regularly check to see if handrails are loose or broken. Make sure that both sides of any steps have handrails. Any raised decks and porches should have guardrails on the edges. Have any leaves, snow, or ice cleared regularly. Use sand or salt on walking paths during winter. Clean up any spills in your garage right away. This includes oil or grease spills. What can I do in the bathroom? Use night lights. Install grab bars by the toilet and in the tub and shower. Do not use towel bars as grab bars. Use non-skid mats or decals in the tub or shower. If you need to sit down in the shower, use a plastic, non-slip stool. Keep the floor dry. Clean up any water that spills on the floor as soon as it happens. Remove soap buildup in the tub or shower  regularly. Attach bath mats securely with double-sided non-slip rug tape. Do not have throw rugs and other things on the floor that can make you trip. What can I do in the bedroom? Use night lights. Make sure that you have a light by your bed that is easy to reach. Do not use any sheets or blankets that are too big for your bed. They should not hang down onto the floor. Have a firm chair that has side arms. You can use this for support while you get dressed. Do not have throw rugs and other things on the floor that can make you trip. What can I do in the kitchen? Clean up any spills right away. Avoid walking on wet floors. Keep items that you use a lot in easy-to-reach places. If you need to reach something above you, use a strong step stool that has a grab bar. Keep electrical cords out of the way. Do not use floor polish or wax that makes floors slippery. If you must use wax, use non-skid floor wax. Do not have throw rugs and other things on the floor that can make you trip. What can I do with my stairs? Do not leave any items on the stairs. Make sure that there are handrails on both sides of the stairs and use them. Fix handrails that are broken or loose. Make sure that handrails are as long as the stairways. Check any  carpeting to make sure that it is firmly attached to the stairs. Fix any carpet that is loose or worn. Avoid having throw rugs at the top or bottom of the stairs. If you do have throw rugs, attach them to the floor with carpet tape. Make sure that you have a light switch at the top of the stairs and the bottom of the stairs. If you do not have them, ask someone to add them for you. What else can I do to help prevent falls? Wear shoes that: Do not have high heels. Have rubber bottoms. Are comfortable and fit you well. Are closed at the toe. Do not wear sandals. If you use a stepladder: Make sure that it is fully opened. Do not climb a closed stepladder. Make sure that  both sides of the stepladder are locked into place. Ask someone to hold it for you, if possible. Clearly mark and make sure that you can see: Any grab bars or handrails. First and last steps. Where the edge of each step is. Use tools that help you move around (mobility aids) if they are needed. These include: Canes. Walkers. Scooters. Crutches. Turn on the lights when you go into a dark area. Replace any light bulbs as soon as they burn out. Set up your furniture so you have a clear path. Avoid moving your furniture around. If any of your floors are uneven, fix them. If there are any pets around you, be aware of where they are. Review your medicines with your doctor. Some medicines can make you feel dizzy. This can increase your chance of falling. Ask your doctor what other things that you can do to help prevent falls. This information is not intended to replace advice given to you by your health care provider. Make sure you discuss any questions you have with your health care provider. Document Released: 08/12/2009 Document Revised: 03/23/2016 Document Reviewed: 11/20/2014 Elsevier Interactive Patient Education  2017 [employer].

## 2021-06-14 ENCOUNTER — Encounter: Payer: Self-pay | Admitting: Family Medicine

## 2021-06-14 ENCOUNTER — Other Ambulatory Visit: Payer: Self-pay

## 2021-06-14 ENCOUNTER — Ambulatory Visit (INDEPENDENT_AMBULATORY_CARE_PROVIDER_SITE_OTHER): Payer: HMO | Admitting: Family Medicine

## 2021-06-14 VITALS — BP 120/71 | HR 75 | Temp 98.0°F | Ht 60.0 in | Wt 163.6 lb

## 2021-06-14 DIAGNOSIS — E119 Type 2 diabetes mellitus without complications: Secondary | ICD-10-CM

## 2021-06-14 DIAGNOSIS — N898 Other specified noninflammatory disorders of vagina: Secondary | ICD-10-CM

## 2021-06-14 DIAGNOSIS — I10 Essential (primary) hypertension: Secondary | ICD-10-CM

## 2021-06-14 DIAGNOSIS — E212 Other hyperparathyroidism: Secondary | ICD-10-CM | POA: Diagnosis not present

## 2021-06-14 LAB — POCT GLYCOSYLATED HEMOGLOBIN (HGB A1C): Hemoglobin A1C: 9.4 % — AB (ref 4.0–5.6)

## 2021-06-14 MED ORDER — FLUCONAZOLE 150 MG PO TABS: 150.0000 mg | ORAL_TABLET | Freq: Once | ORAL | 0 refills | Status: AC

## 2021-06-14 MED ORDER — JANUMET XR 50-1000 MG PO TB24: 2.0000 | ORAL_TABLET | Freq: Every day | ORAL | 0 refills | Status: DC

## 2021-06-14 NOTE — Assessment & Plan Note (Signed)
Managed by endocrinology.  Will order DEXA scan.

## 2021-06-14 NOTE — Assessment & Plan Note (Signed)
At goal.  Continue losartan 50 mg daily. 

## 2021-06-14 NOTE — Patient Instructions (Signed)
It was very nice to see you today!  Please take your janumet twice daily.  We will order a bone density scan.  Please come back in 3 months or sooner if needed.   Take care, Dr Jerline Pain  PLEASE NOTE:  If you had any lab tests please let us know if you have not heard back within a few days. You may see your results on mychart before we have a chance to review them but we will give you a call once they are reviewed by [name]. If we ordered any referrals today, please let us know if you have not heard from their office within the next week.   Please try these tips to maintain a healthy lifestyle:  Eat at least 3 REAL meals and 1-2 snacks per day.  Aim for no more than 5 hours between eating.  If you eat breakfast, please do so within one hour of getting up.   Each meal should contain half fruits/vegetables, one quarter protein, and one quarter carbs (no bigger than a computer mouse)  Cut down on sweet beverages. This includes juice, soda, and sweet tea.   Drink at least 1 glass of water with each meal and aim for at least 8 glasses per day  Exercise at least 150 minutes every week.

## 2021-06-14 NOTE — Progress Notes (Signed)
   [name] is a 86 y.o. female who presents today for an office visit.  Assessment/Plan:  New/Acute Problems: Vaginal discharge Likely yeast vaginitis.  We will start Diflucan 150 mg once.She will let me know if not improving.  Chronic Problems Addressed Today: Other hyperparathyroidism ([place]) Managed by endocrinology.  Will order DEXA scan.  Essential hypertension At goal.  Continue losartan 50 mg daily.  Diabetes mellitus without complication (HCC) 123456 not controlled at 9.4.  We will increase her Janumet to 50-1000 twice daily.  She will work on lifestyle modifications.  We will recheck A1c in 3 months.  May consider switching to Rybelsus depending on A1c at follow-up.     Subjective:  HPI:  See A/p for status of chronic conditions.   He has been under a lot of stress recently dealing with the health of her son and husband. Was recently found to have a mass on his brain we will need to have any surgery soon.  Her husband has been dealing with a broken toe and she has been primary caregiver.  She has not been as careful with her diet and exercise as she would like.  She has also had some intermittent vaginal discharge for the past several weeks.  She tried using over-the-counter cream which seems to help but is persistent.  Seems consistent with prior yeast infections.       Objective:  Physical Exam: BP 120/71   Pulse 75   Temp 98 F (36.7 C) (Temporal)   Ht 5' (1.524 m)   Wt 163 lb 9.6 oz (74.2 kg)   SpO2 96%   BMI 31.95 kg/m   Gen: No acute distress, resting comfortably CV: Regular rate and rhythm with no murmurs appreciated Pulm: Normal work of breathing, clear to auscultation bilaterally with no crackles, wheezes, or rhonchi Neuro: Grossly normal, moves all extremities Psych: Normal affect and thought content  Time Spent: 45 minutes of total time was spent on the date of the encounter performing the following actions: chart review prior to seeing the  patient, obtaining history, performing a medically necessary exam, counseling on the treatment plan including medication changes, placing orders, and documenting in our EHR.        Algis Greenhouse. Jerline Pain, MD 06/14/2021 12:02 PM

## 2021-06-14 NOTE — Assessment & Plan Note (Signed)
A1c not controlled at 9.4.  We will increase her Janumet to 50-1000 twice daily.  She will work on lifestyle modifications.  We will recheck A1c in 3 months.  May consider switching to Rybelsus depending on A1c at follow-up.

## 2021-06-15 ENCOUNTER — Other Ambulatory Visit: Payer: Self-pay | Admitting: Family Medicine

## 2021-06-20 ENCOUNTER — Ambulatory Visit: Payer: HMO | Admitting: Family Medicine

## 2021-06-20 ENCOUNTER — Telehealth: Payer: Self-pay

## 2021-06-20 NOTE — Telephone Encounter (Signed)
Error

## 2021-06-30 ENCOUNTER — Telehealth: Payer: Self-pay | Admitting: Pharmacist

## 2021-06-30 DIAGNOSIS — E212 Other hyperparathyroidism: Secondary | ICD-10-CM | POA: Diagnosis not present

## 2021-06-30 NOTE — Chronic Care Management (AMB) (Signed)
    Chronic Care Management Pharmacy Assistant   Name: Caleen J Splawn  MRN: [id number] DOB: 04/25/1935   Reason for Encounter: Medication Coordination  Call    Recent office visits:  06/14/2021 [facility] (PCP) Parker, Caleb M, MD; no medication changes indicated.  Recent consult visits:  None  Hospital visits:  None in previous 6 months  Medications: Outpatient Encounter Medications as of 06/30/2021  Medication Sig   blood glucose meter kit and supplies KIT Dispense based on patient and insurance preference. Use up to four times daily as directed. (FOR ICD-9 250.00, 250.01).   Cholecalciferol (VITAMIN D-3) 1000 UNITS CAPS Take 2,000 Units by mouth 2 (two) times daily.   cinacalcet (SENSIPAR) 30 MG tablet TAKE 1 TABLET(30 MG) BY MOUTH EVERY OTHER DAY   glimepiride (AMARYL) 2 MG tablet Take 1 tablet (2 mg total) by mouth daily with breakfast.   losartan (COZAAR) 50 MG tablet TAKE 1 TABLET BY MOUTH EVERY DAY   Multiple Vitamins-Minerals (CENTRUM SILVER 50+WOMEN PO) Take 1 tablet by mouth daily in the afternoon.   omeprazole (PRILOSEC) 20 MG capsule TAKE 1 CAPSULE(20 MG) BY MOUTH DAILY   ONETOUCH VERIO test strip CHECK FOUR TIMES DAILY   phenazopyridine (PYRIDIUM) 200 MG tablet TAKE 1 TABLET(200 MG) BY MOUTH THREE TIMES DAILY AS NEEDED FOR PAIN   rosuvastatin (CRESTOR) 10 MG tablet TAKE 1 TABLET THREE TIMES WEEKLY MONDAY WEDNESDAY AND FRIDAY   SitaGLIPtin-MetFORMIN HCl (JANUMET XR) 50-1000 MG TB24 Take 2 tablets by mouth daily.   sodium chloride (MURO 128) 2 % ophthalmic solution Place 1 drop into both eyes 2 (two) times daily.   No facility-administered encounter medications on file as of 06/30/2021.    Reviewed chart for medication changes ahead of medication coordination call.   BP Readings from Last 3 Encounters:  06/14/21 120/71  03/18/21 124/70  03/04/21 136/78    Lab Results  Component Value Date   HGBA1C 9.4 (A) 06/14/2021     Patient declined medication coordination call.  She states she no longer wants to use [facility]. Patient states he wants to stay with [facility] instead.   April D Calhoun, CMA Clinical Pharmacist Assistant [phone]  

## 2021-07-01 LAB — PTH, INTACT AND CALCIUM
Calcium: 11.4 mg/dL — ABNORMAL HIGH (ref 8.7–10.3)
PTH: 35 pg/mL (ref 15–65)

## 2021-07-05 ENCOUNTER — Encounter: Payer: Self-pay | Admitting: "Endocrinology

## 2021-07-05 ENCOUNTER — Ambulatory Visit: Payer: HMO | Admitting: "Endocrinology

## 2021-07-05 VITALS — BP 136/64 | HR 64 | Ht 60.0 in | Wt 162.6 lb

## 2021-07-05 DIAGNOSIS — E212 Other hyperparathyroidism: Secondary | ICD-10-CM

## 2021-07-05 MED ORDER — CINACALCET HCL 30 MG PO TABS: 30.0000 mg | ORAL_TABLET | Freq: Every day | ORAL | 1 refills | Status: DC

## 2021-07-05 NOTE — Progress Notes (Signed)
07/05/2021, 10:30 AM       Endocrinology follow-up note   [name] is a 86 y.o.-year-old female, here for follow-up of hypercalcemia related to hyperparathyroidism. PMD: her  Brenda Barrack, MD    Past Medical History:  Diagnosis Date   Arthritis    Cancer [facility])    Diabetes mellitus without complication ([place])    GERD (gastroesophageal reflux disease)    Hyperlipidemia    Hypertension    Thyroid disease     Past Surgical History:  Procedure Laterality Date   BREAST SURGERY     MELANOMA EXCISION     TONSILLECTOMY      Social History   Tobacco Use   Smoking status: Former    Packs/day: 1.00    Years: 15.00    Pack years: 15.00    Types: Cigarettes    Start date: 09/14/1955    Quit date: 07/14/1969    Years since quitting: 52.0   Smokeless tobacco: Never   Tobacco comments:    quit 40 years ago  Substance Use Topics   Alcohol use: Never    Alcohol/week: 0.0 standard drinks   Drug use: Never    Family History  Problem Relation Age of Onset   Cancer Mother    Hypertension Father     Outpatient Encounter Medications as of 07/05/2021  Medication Sig   blood glucose meter kit and supplies KIT Dispense based on patient and insurance preference. Use up to four times daily as directed. (FOR ICD-9 250.00, 250.01).   Cholecalciferol (VITAMIN D-3) 1000 UNITS CAPS Take 2,000 Units by mouth 2 (two) times daily.   cinacalcet (SENSIPAR) 30 MG tablet Take 1 tablet (30 mg total) by mouth daily with breakfast.   glimepiride (AMARYL) 2 MG tablet Take 1 tablet (2 mg total) by mouth daily with breakfast.   losartan (COZAAR) 50 MG tablet TAKE 1 TABLET BY MOUTH EVERY DAY   Multiple Vitamins-Minerals (CENTRUM SILVER 50+WOMEN PO) Take 1 tablet by mouth daily in the afternoon.   omeprazole (PRILOSEC) 20 MG capsule TAKE 1 CAPSULE(20 MG) BY MOUTH DAILY   ONETOUCH VERIO test strip CHECK FOUR TIMES DAILY    phenazopyridine (PYRIDIUM) 200 MG tablet TAKE 1 TABLET(200 MG) BY MOUTH THREE TIMES DAILY AS NEEDED FOR PAIN   rosuvastatin (CRESTOR) 10 MG tablet TAKE 1 TABLET THREE TIMES WEEKLY MONDAY WEDNESDAY AND FRIDAY   SitaGLIPtin-MetFORMIN HCl (JANUMET XR) 50-1000 MG TB24 Take 2 tablets by mouth daily.   sodium chloride (MURO 128) 2 % ophthalmic solution Place 1 drop into both eyes 2 (two) times daily.   [DISCONTINUED] cinacalcet (SENSIPAR) 30 MG tablet TAKE 1 TABLET(30 MG) BY MOUTH EVERY OTHER DAY   No facility-administered encounter medications on file as of 07/05/2021.    No Known Allergies   HPI  [name] was diagnosed with hypercalcemia in May 2020.   Prior to her last visit, her work-up to help determine the hypercalcemia to be secondary to early mild primary hyperparathyroidism.   -Since she is not a surgical candidate, she was started on Sensipar, currently on 30 mg p.o. every other day with  breakfast.  She continues to tolerate this medication.  Her previsit labs however show higher calcium at 11.4 increasing from 10.9.  Her PTH is 35.   She has some muscle cramps as a new complaint today.  She has diabetes uncontrolled with 9.4 A1c, hyperlipidemia uncontrolled on Crestor 10 mg.  She is known to have recurrent nephrolithiasis.  No history of CKD.   she is not on HCTZ or other thiazide therapy.  She is on vitamin D supplement.  She is not on calcium supplements. she eats dairy and green, leafy, vegetables on average amounts.  she does not have a family history of hypercalcemia, pituitary tumors, thyroid cancer, or osteoporosis.  -On history, she lost 2 inches of height over the years.  I reviewed her chart and she also has a history of controlled type 2 diabetes on Metformin, recent A1c of 6.6%.    ROS: Limited as above.  PE: BP 136/64   Pulse 64   Ht 5' (1.524 m)   Wt 162 lb 9.6 oz (73.8 kg)   BMI 31.76 kg/m , Body mass index is 31.76 kg/m. Wt Readings from Last 3  Encounters:  07/05/21 162 lb 9.6 oz (73.8 kg)  06/14/21 163 lb 9.6 oz (74.2 kg)  03/18/21 165 lb 3.2 oz (74.9 kg)       CMP     Component Value Date/Time   NA 137 03/18/2021 1039   NA 140 03/12/2018 0000   NA 145 02/15/2017 1333   NA 144 08/12/2015 1419   K 4.8 03/18/2021 1039   K 4.4 02/15/2017 1333   K 4.2 08/12/2015 1419   CL 102 03/18/2021 1039   CL 104 02/15/2017 1333   CO2 25 03/18/2021 1039   CO2 28 02/15/2017 1333   CO2 29 08/12/2015 1419   GLUCOSE 281 (H) 03/18/2021 1039   GLUCOSE 142 (H) 02/15/2017 1333   BUN 12 03/18/2021 1039   BUN 11 03/12/2018 0000   BUN 13 02/15/2017 1333   BUN 11.8 08/12/2015 1419   CREATININE 0.94 03/18/2021 1039   CREATININE 0.82 03/24/2020 1122   CREATININE 0.8 08/12/2015 1419   CALCIUM 11.4 (H) 06/30/2021 0937   CALCIUM 10.2 02/15/2017 1333   CALCIUM 11.1 (H) 08/12/2015 1419   PROT 7.7 03/18/2021 1039   PROT 7.5 02/15/2017 1333   PROT 7.2 08/12/2015 1419   ALBUMIN 4.4 03/18/2021 1039   ALBUMIN 150 MG/L 07/15/2019 1130   ALBUMIN 3.9 08/12/2015 1419   AST 20 03/18/2021 1039   AST 32 02/15/2017 1333   AST 26 08/12/2015 1419   ALT 24 03/18/2021 1039   ALT 39 02/15/2017 1333   ALT 32 08/12/2015 1419   ALKPHOS 89 03/18/2021 1039   ALKPHOS 76 02/15/2017 1333   ALKPHOS 74 08/12/2015 1419   BILITOT 0.7 03/18/2021 1039   BILITOT 0.90 02/15/2017 1333   BILITOT 0.52 08/12/2015 1419   GFRNONAA 67 03/24/2020 1122   GFRAA 77 03/24/2020 1122     Diabetic Labs (most recent): Lab Results  Component Value Date   HGBA1C 9.4 (A) 06/14/2021   HGBA1C 8.5 (A) 03/18/2021   HGBA1C 8.3 (A) 12/17/2020     Lipid Panel ( most recent) Lipid Panel     Component Value Date/Time   CHOL 159 03/18/2021 1039   TRIG 368.0 (H) 03/18/2021 1039   HDL 30.10 (L) 03/18/2021 1039   CHOLHDL 5 03/18/2021 1039   VLDL 73.6 (H) 03/18/2021 1039   LDLCALC 86 11/06/2019 0836   LDLDIRECT 91.0 03/18/2021  1039      Lab Results  Component Value Date   TSH  6.29 (H) 04/18/2021   TSH 5.27 (H) 03/18/2021   TSH 3.96 11/21/2019   TSH 4.00 03/17/2019   TSH 4.36 03/12/2018   TSH 2.93 03/12/2017   FREET4 0.58 (L) 04/18/2021      Assessment: 1. Hypercalcemia / Hyperparathyroidism  Plan: See notes from prior visits.  She returns with a higher serum calcium at 10.4, previously responded to appropriate dose of Sensipar.  She is advised to increase her Sensipar to 30 mg p.o. every day with plan to measure PTH/calcium in 3 months.   - Patient also  has vitamin D deficiency on supplement, most current vitamin D was 39. -PTH RP is normal, malignancy related hypercalcemia unlikely. -There seems to be a complication from hypercalcemia/hypocalcemia with 65m nonobstructive right sided nephrolithiasis.  no history of   osteoporosis,fragility fractures. No abdominal pain, no major mood disorders, no bone pain-she is encouraged to obtain her bone density.  -She is not a surgical candidate and wishes to avoid surgery.      -She does not have recent bone density to review.  She will  need DEXA scan to include the distal  33% of  radius for evaluation of cortical bone on her subsequent visits. She has uncontrolled type 2 diabetes, wishes to address it with her PMD. - she acknowledges that there is a room for improvement in her food and drink choices. - Suggestion is made for her to avoid simple carbohydrates  from her diet including Cakes, Sweet Desserts, Ice Cream, Soda (diet and regular), Sweet Tea, Candies, Chips, Cookies, Store Bought Juices, Alcohol in Excess of  1-2 drinks a day, Artificial Sweeteners,  Coffee Creamer, and "Sugar-free" Products, Lemonade. This will help patient to have more stable blood glucose profile and potentially avoid unintended weight gain. She is currently on glimepiride 2 mg p.o. daily as well as Janumet 50/1000 mg p.o. daily. She is advised to maintain close follow-up with her PCP.    I spent 21 minutes in the care of the patient  today including review of labs from Thyroid Function, CMP, and other relevant labs ; imaging/biopsy records (current and previous including abstractions from other facilities); face-to-face time discussing  her lab results and symptoms, medications doses, her options of short and long term treatment based on the latest standards of care / guidelines;   and documenting the encounter.  ERubie MaidPresnell  participated in the discussions, expressed understanding, and voiced agreement with the above plans.  All questions were answered to her satisfaction. she is encouraged to contact clinic should she have any questions or concerns prior to her return visit.    - Return in about 3 months (around 10/04/2021) for F/U with Pre-visit Labs.   GGlade Lloyd MD [facility] R[facility] - [facility] County[address]  [id number]: [phone] Fax: [phone]   This note was partially dictated with voice recognition software. Similar sounding words can be transcribed inadequately or may not  be corrected upon review.  07/05/2021, 10:30 AM

## 2021-07-06 ENCOUNTER — Other Ambulatory Visit: Payer: Self-pay | Admitting: Family Medicine

## 2021-07-07 ENCOUNTER — Other Ambulatory Visit: Payer: Self-pay | Admitting: Family Medicine

## 2021-07-08 ENCOUNTER — Telehealth: Payer: Self-pay

## 2021-07-08 NOTE — Telephone Encounter (Signed)
Called and spoke with pt and made her aware.

## 2021-07-08 NOTE — Telephone Encounter (Signed)
Pt called wanting to know if she can take Aleve allergy and cold medicine. [name] wants  to know if it will interact with her other prescriptions. She would like a nurse to call her back. Please Advise.

## 2021-07-08 NOTE — Telephone Encounter (Signed)
See below

## 2021-07-08 NOTE — Telephone Encounter (Signed)
It is ok to take short term or intermittently. Would not recommend long term use.  Algis Greenhouse. Jerline Pain, MD 07/08/2021 2:06 PM

## 2021-07-13 ENCOUNTER — Other Ambulatory Visit: Payer: Self-pay | Admitting: Family Medicine

## 2021-07-14 ENCOUNTER — Telehealth: Payer: Self-pay | Admitting: *Deleted

## 2021-07-14 NOTE — Telephone Encounter (Signed)
Patient stated taking Rx Janumet and will like to go back to take Rx metformin once she run out of Janumet  Please advise

## 2021-07-15 NOTE — Telephone Encounter (Signed)
Call patient unable to LVM no voice mail

## 2021-07-15 NOTE — Telephone Encounter (Signed)
IS there a reason she would like to swtich? Her A1c was high last time so I am hesitant to reduce her medications without getting an A1c first.  Algis Greenhouse. Jerline Pain, MD 07/15/2021 3:01 PM

## 2021-07-19 ENCOUNTER — Telehealth: Payer: HMO

## 2021-07-19 NOTE — Telephone Encounter (Signed)
Patient notified is voices understanding. She wanted to switch due to having to bottles of metformin at home. She will continue Janumet.

## 2021-07-26 ENCOUNTER — Other Ambulatory Visit: Payer: Self-pay | Admitting: Family Medicine

## 2021-08-16 DIAGNOSIS — H353131 Nonexudative age-related macular degeneration, bilateral, early dry stage: Secondary | ICD-10-CM | POA: Diagnosis not present

## 2021-08-16 DIAGNOSIS — H18593 Other hereditary corneal dystrophies, bilateral: Secondary | ICD-10-CM | POA: Diagnosis not present

## 2021-08-16 DIAGNOSIS — E119 Type 2 diabetes mellitus without complications: Secondary | ICD-10-CM | POA: Diagnosis not present

## 2021-08-16 DIAGNOSIS — D3131 Benign neoplasm of right choroid: Secondary | ICD-10-CM | POA: Diagnosis not present

## 2021-08-16 DIAGNOSIS — H04123 Dry eye syndrome of bilateral lacrimal glands: Secondary | ICD-10-CM | POA: Diagnosis not present

## 2021-08-16 DIAGNOSIS — Z961 Presence of intraocular lens: Secondary | ICD-10-CM | POA: Diagnosis not present

## 2021-08-16 DIAGNOSIS — H02831 Dermatochalasis of right upper eyelid: Secondary | ICD-10-CM | POA: Diagnosis not present

## 2021-08-16 DIAGNOSIS — D3132 Benign neoplasm of left choroid: Secondary | ICD-10-CM | POA: Diagnosis not present

## 2021-08-16 DIAGNOSIS — H02834 Dermatochalasis of left upper eyelid: Secondary | ICD-10-CM | POA: Diagnosis not present

## 2021-09-13 ENCOUNTER — Ambulatory Visit (INDEPENDENT_AMBULATORY_CARE_PROVIDER_SITE_OTHER): Payer: HMO | Admitting: Family Medicine

## 2021-09-13 ENCOUNTER — Other Ambulatory Visit: Payer: Self-pay

## 2021-09-13 ENCOUNTER — Encounter: Payer: Self-pay | Admitting: Family Medicine

## 2021-09-13 VITALS — BP 159/79 | HR 72 | Temp 98.0°F | Ht 60.0 in | Wt 159.6 lb

## 2021-09-13 DIAGNOSIS — E119 Type 2 diabetes mellitus without complications: Secondary | ICD-10-CM

## 2021-09-13 DIAGNOSIS — I10 Essential (primary) hypertension: Secondary | ICD-10-CM | POA: Diagnosis not present

## 2021-09-13 DIAGNOSIS — F4329 Adjustment disorder with other symptoms: Secondary | ICD-10-CM | POA: Diagnosis not present

## 2021-09-13 DIAGNOSIS — F4320 Adjustment disorder, unspecified: Secondary | ICD-10-CM | POA: Insufficient documentation

## 2021-09-13 LAB — POCT GLYCOSYLATED HEMOGLOBIN (HGB A1C): Hemoglobin A1C: 7.8 % — AB (ref 4.0–5.6)

## 2021-09-13 MED ORDER — SITAGLIPTIN PHOSPHATE 100 MG PO TABS: 100.0000 mg | ORAL_TABLET | Freq: Every day | ORAL | 3 refills | Status: DC

## 2021-09-13 MED ORDER — EMPAGLIFLOZIN 10 MG PO TABS: 10.0000 mg | ORAL_TABLET | Freq: Every day | ORAL | 3 refills | Status: DC

## 2021-09-13 NOTE — Assessment & Plan Note (Signed)
A1c improved to 7.8.  She would like to stop metformin.  She is having some diarrhea with this.  We will start Jardiance 10 mg daily per her request.  Discussed potential side effects.  We will also send in Januvia 100 mg daily.  She was already getting this with her Janumet.  She will come back in 3 months to recheck A1c.  Could consider trial of GLP-1 agonist in the future if A1c is not at goal.

## 2021-09-13 NOTE — Patient Instructions (Signed)
It was very nice to see you today!  Your A1c is 7.8.  Please start the [place] and Jardiance.  Let me know how this is going.  We will see back in 3 months to recheck your A1c.  Please let us know if the wound does not heal over the next few weeks.  Take care, Dr Jerline Pain  PLEASE NOTE:  If you had any lab tests please let us know if you have not heard back within a few days. You may see your results on mychart before we have a chance to review them but we will give you a call once they are reviewed by [name]. If we ordered any referrals today, please let us know if you have not heard from their office within the next week.   Please try these tips to maintain a healthy lifestyle:  Eat at least 3 REAL meals and 1-2 snacks per day.  Aim for no more than 5 hours between eating.  If you eat breakfast, please do so within one hour of getting up.   Each meal should contain half fruits/vegetables, one quarter protein, and one quarter carbs (no bigger than a computer mouse)  Cut down on sweet beverages. This includes juice, soda, and sweet tea.   Drink at least 1 glass of water with each meal and aim for at least 8 glasses per day  Exercise at least 150 minutes every week.

## 2021-09-13 NOTE — Assessment & Plan Note (Signed)
Slightly above goal though typically well controlled.  Continue home monitoring goal 150/90 or lower.  Continue losartan 50 mg daily.

## 2021-09-13 NOTE — Assessment & Plan Note (Signed)
She has been under more stress due to the health of her son.  Discussed medication versus therapy.  She will defer for now.

## 2021-09-13 NOTE — Progress Notes (Signed)
   [name] is a 86 y.o. female who presents today for an office visit.  Assessment/Plan:  New/Acute Problems: Skin Tear No signs of infection.  This will heal via secondary intention over the next several weeks.  Discussed wound care and reasons to return to care.    Chronic Problems Addressed Today: Adjustment disorder She has been under more stress due to the health of her son.  Discussed medication versus therapy.  She will defer for now.  Diabetes mellitus without complication (HCC) C3J improved to 7.8.  She would like to stop metformin.  She is having some diarrhea with this.  We will start Jardiance 10 mg daily per her request.  Discussed potential side effects.  We will also send in Januvia 100 mg daily.  She was already getting this with her Janumet.  She will come back in 3 months to recheck A1c.  Could consider trial of GLP-1 agonist in the future if A1c is not at goal.  Essential hypertension Slightly above goal though typically well controlled.  Continue home monitoring goal 150/90 or lower.  Continue losartan 50 mg daily.     Subjective:  HPI:  She is here to follow up on diabetes mellitus. She was last seen in the office on 08/16/202. We increased Janumet 50-1000 twice daily in the last visit. She admit her blood sugar was elevated at home over 200. She is currently on Metformin and would to stop this medication. She is experiencing some side effects which include diarrhea. She is interesting to try new medication.    Her blood pressure at office was elevated today. She thinks this is due to her stress. She is under a lot of stress. Her son recently had brain surgery for a tumor on his brainstem and had stroke. She is going to church to help her feel better.  Additionally, she have a wound in her right hand and would like to be checked today. This issue started  4 days ago. She was trying to fix her jacket button. She is using advanced healing bandages which seems to  help.       Objective:  Physical Exam: BP (!) 159/79   Pulse 72   Temp 98 F (36.7 C) (Temporal)   Ht 5' (1.524 m)   Wt 159 lb 9.6 oz (72.4 kg)   SpO2 96%   BMI 31.17 kg/m   Gen: No acute distress, resting comfortably CV: Regular rate and rhythm with no murmurs appreciated Pulm: Normal work of breathing, clear to auscultation bilaterally with no crackles, wheezes, or rhonchi Skin: Proximately 3 x 1 cm skin tear on dorsal aspect of right hand.  No surrounding erythema.  Healthy granulation tissue present. Neuro: Grossly normal, moves all extremities Psych: Normal affect and thought content       I,Savera Zaman,acting as a scribe for Dimas Chyle, MD.,have documented all relevant documentation on the behalf of Dimas Chyle, MD,as directed by  Dimas Chyle, MD while in the presence of Dimas Chyle, MD.   I, Dimas Chyle, MD, have reviewed all documentation for this visit. The documentation on 09/13/21 for the exam, diagnosis, procedures, and orders are all accurate and complete.  Algis Greenhouse. Jerline Pain, MD 09/13/2021 11:42 AM

## 2021-09-26 DIAGNOSIS — E212 Other hyperparathyroidism: Secondary | ICD-10-CM | POA: Diagnosis not present

## 2021-09-27 LAB — PTH, INTACT AND CALCIUM
Calcium: 10.6 mg/dL — ABNORMAL HIGH (ref 8.7–10.3)
PTH: 78 pg/mL — ABNORMAL HIGH (ref 15–65)

## 2021-10-04 ENCOUNTER — Other Ambulatory Visit: Payer: Self-pay

## 2021-10-04 ENCOUNTER — Encounter: Payer: Self-pay | Admitting: "Endocrinology

## 2021-10-04 ENCOUNTER — Ambulatory Visit: Payer: HMO | Admitting: "Endocrinology

## 2021-10-04 VITALS — BP 118/65 | HR 80 | Ht 60.0 in | Wt 157.2 lb

## 2021-10-04 DIAGNOSIS — E212 Other hyperparathyroidism: Secondary | ICD-10-CM

## 2021-10-04 MED ORDER — CINACALCET HCL 30 MG PO TABS: 30.0000 mg | ORAL_TABLET | Freq: Every day | ORAL | 1 refills | Status: DC

## 2021-10-04 NOTE — Progress Notes (Signed)
10/04/2021, 5:10 PM       Endocrinology follow-up note   [name] is a 86 y.o.-year-old female, here for follow-up of hypercalcemia related to hyperparathyroidism. PMD: her  [name], MD    Past Medical History:  Diagnosis Date   Arthritis    Cancer [facility])    Diabetes mellitus without complication ([place])    GERD (gastroesophageal reflux disease)    Hyperlipidemia    Hypertension    Thyroid disease     Past Surgical History:  Procedure Laterality Date   BREAST SURGERY     MELANOMA EXCISION     TONSILLECTOMY      Social History   Tobacco Use   Smoking status: Former    Packs/day: 1.00    Years: 15.00    Pack years: 15.00    Types: Cigarettes    Start date: 09/14/1955    Quit date: 07/14/1969    Years since quitting: 52.2   Smokeless tobacco: Never   Tobacco comments:    quit 40 years ago  Substance Use Topics   Alcohol use: Never    Alcohol/week: 0.0 standard drinks   Drug use: Never    Family History  Problem Relation Age of Onset   Cancer Mother    Hypertension Father     Outpatient Encounter Medications as of 10/04/2021  Medication Sig   blood glucose meter kit and supplies KIT Dispense based on patient and insurance preference. Use up to four times daily as directed. (FOR ICD-9 250.00, 250.01).   Cholecalciferol (VITAMIN D-3) 1000 UNITS CAPS Take 2,000 Units by mouth 2 (two) times daily.   cinacalcet (SENSIPAR) 30 MG tablet Take 1 tablet (30 mg total) by mouth daily with breakfast.   empagliflozin (JARDIANCE) 10 MG TABS tablet Take 1 tablet (10 mg total) by mouth daily before breakfast.   glimepiride (AMARYL) 2 MG tablet TAKE 1 TABLET(2 MG) BY MOUTH DAILY WITH BREAKFAST   losartan (COZAAR) 50 MG tablet TAKE 1 TABLET BY MOUTH EVERY DAY   Multiple Vitamins-Minerals (CENTRUM SILVER 50+WOMEN PO) Take 1 tablet by mouth daily.   omeprazole (PRILOSEC) 20 MG capsule TAKE 1  CAPSULE(20 MG) BY MOUTH DAILY   ONETOUCH VERIO test strip CHECK FOUR TIMES DAILY   phenazopyridine (PYRIDIUM) 200 MG tablet TAKE 1 TABLET(200 MG) BY MOUTH THREE TIMES DAILY AS NEEDED FOR PAIN   rosuvastatin (CRESTOR) 10 MG tablet TAKE 1 TABLET THREE TIMES WEEKLY MONDAY WEDNESDAY AND FRIDAY (Patient taking differently: daily.)   sitaGLIPtin (JANUVIA) 100 MG tablet Take 1 tablet (100 mg total) by mouth daily.   sodium chloride (MURO 128) 2 % ophthalmic solution Place 1 drop into both eyes 2 (two) times daily.   [DISCONTINUED] cinacalcet (SENSIPAR) 30 MG tablet Take 1 tablet (30 mg total) by mouth daily with breakfast.   No facility-administered encounter medications on file as of 10/04/2021.    No Known Allergies   HPI  [name] was diagnosed with hypercalcemia in May 2020.   Prior to her last visit, her work-up to help determine the hypercalcemia to be secondary to early mild primary hyperparathyroidism.   -  Since she is not a surgical candidate, she was started on Sensipar, currently on 30 mg p.o. every other day with breakfast.  She continues to tolerate this medication.  Her previsit labs show improved calcium of 10.6 from 11.4 .   She has some muscle cramps as a new complaint today.  She has diabetes  now better controlled at a1c of 7.8% from  9.4%. She also has hyperlipidemia uncontrolled on Crestor 10 mg.  She is known to have recurrent nephrolithiasis.  No history of CKD.   she is not on HCTZ or other thiazide therapy.  She is on vitamin D supplement.  She is not on calcium supplements. she eats dairy and green, leafy, vegetables on average amounts.  she does not have a family history of hypercalcemia, pituitary tumors, thyroid cancer, or osteoporosis.  -On history, she lost 2 inches of height over the years.  I reviewed her chart and she also has a history of controlled type 2 diabetes on Metformin, recent A1c of 6.6%.    ROS: Limited as above.  PE: BP 118/65    Pulse 80   Ht 5' (1.524 m)   Wt 157 lb 3.2 oz (71.3 kg)   BMI 30.70 kg/m , Body mass index is 30.7 kg/m. Wt Readings from Last 3 Encounters:  10/04/21 157 lb 3.2 oz (71.3 kg)  09/13/21 159 lb 9.6 oz (72.4 kg)  07/05/21 162 lb 9.6 oz (73.8 kg)       CMP     Component Value Date/Time   NA 137 03/18/2021 1039   NA 140 03/12/2018 0000   NA 145 02/15/2017 1333   NA 144 08/12/2015 1419   K 4.8 03/18/2021 1039   K 4.4 02/15/2017 1333   K 4.2 08/12/2015 1419   CL 102 03/18/2021 1039   CL 104 02/15/2017 1333   CO2 25 03/18/2021 1039   CO2 28 02/15/2017 1333   CO2 29 08/12/2015 1419   GLUCOSE 281 (H) 03/18/2021 1039   GLUCOSE 142 (H) 02/15/2017 1333   BUN 12 03/18/2021 1039   BUN 11 03/12/2018 0000   BUN 13 02/15/2017 1333   BUN 11.8 08/12/2015 1419   CREATININE 0.94 03/18/2021 1039   CREATININE 0.82 03/24/2020 1122   CREATININE 0.8 08/12/2015 1419   CALCIUM 10.6 (H) 09/26/2021 1013   CALCIUM 10.2 02/15/2017 1333   CALCIUM 11.1 (H) 08/12/2015 1419   PROT 7.7 03/18/2021 1039   PROT 7.5 02/15/2017 1333   PROT 7.2 08/12/2015 1419   ALBUMIN 4.4 03/18/2021 1039   ALBUMIN 150 MG/L 07/15/2019 1130   ALBUMIN 3.9 08/12/2015 1419   AST 20 03/18/2021 1039   AST 32 02/15/2017 1333   AST 26 08/12/2015 1419   ALT 24 03/18/2021 1039   ALT 39 02/15/2017 1333   ALT 32 08/12/2015 1419   ALKPHOS 89 03/18/2021 1039   ALKPHOS 76 02/15/2017 1333   ALKPHOS 74 08/12/2015 1419   BILITOT 0.7 03/18/2021 1039   BILITOT 0.90 02/15/2017 1333   BILITOT 0.52 08/12/2015 1419   GFRNONAA 67 03/24/2020 1122   GFRAA 77 03/24/2020 1122     Diabetic Labs (most recent): Lab Results  Component Value Date   HGBA1C 7.8 (A) 09/13/2021   HGBA1C 9.4 (A) 06/14/2021   HGBA1C 8.5 (A) 03/18/2021     Lipid Panel ( most recent) Lipid Panel     Component Value Date/Time   CHOL 159 03/18/2021 1039   TRIG 368.0 (H) 03/18/2021 1039   HDL 30.10 (L) 03/18/2021  1039   CHOLHDL 5 03/18/2021 1039   VLDL 73.6  (H) 03/18/2021 1039   LDLCALC 86 11/06/2019 0836   LDLDIRECT 91.0 03/18/2021 1039      Lab Results  Component Value Date   TSH 6.29 (H) 04/18/2021   TSH 5.27 (H) 03/18/2021   TSH 3.96 11/21/2019   TSH 4.00 03/17/2019   TSH 4.36 03/12/2018   TSH 2.93 03/12/2017   FREET4 0.58 (L) 04/18/2021      Assessment: 1. Hypercalcemia / Hyperparathyroidism  Plan: See notes from prior visits.  She returns with a improved calcium at 10.6 responding  to appropriate dose of Sensipar.  She is advised to continue  Sensipar 30 mg p.o. every day with plan to measure PTH/calcium in 3 months.  PTH at 78. - Patient also  has vitamin D deficiency on supplement, most current vitamin D was 39. -PTH RP is normal, malignancy related hypercalcemia unlikely. -There seems to be a complication from hypercalcemia/hypocalcemia with 50m nonobstructive right sided nephrolithiasis.  no history of   osteoporosis,fragility fractures. No abdominal pain, no major mood disorders, no bone pain-she is encouraged to obtain her bone density.  -She is not a surgical candidate and wishes to avoid surgery.      -She does not have recent bone density to review.  She will  need DEXA scan to include the distal  33% of  radius for evaluation of cortical bone on her subsequent visits.  She has uncontrolled type 2 diabetes, better a1c of 7.8% . She is addressing with her PMD.  She is advised to maintain close follow-up with her PCP.  I spent  21 minutes in the care of the patient today including review of labs from Thyroid Function, CMP, and other relevant labs ; imaging/biopsy records (current and previous including abstractions from other facilities); face-to-face time discussing  her lab results and symptoms, medications doses, her options of short and long term treatment based on the latest standards of care / guidelines;   and documenting the encounter.  ERubie MaidPresnell  participated in the discussions, expressed  understanding, and voiced agreement with the above plans.  All questions were answered to her satisfaction. she is encouraged to contact clinic should she have any questions or concerns prior to her return visit.    - Return in about 6 months (around 04/04/2022) for F/U with Pre-visit Labs, DXA Scan B4 NV.   GGlade Lloyd MD CColmery-O[facility] CenterGroup [facility] [id number]: [phone] Fax: [phone]   This note was partially dictated with voice recognition software. Similar sounding words can be transcribed inadequately or may not  be corrected upon review.  10/04/2021, 5:10 PM

## 2021-10-12 ENCOUNTER — Other Ambulatory Visit: Payer: Self-pay | Admitting: Family Medicine

## 2021-11-15 ENCOUNTER — Telehealth: Payer: Self-pay | Admitting: "Endocrinology

## 2021-11-15 DIAGNOSIS — E212 Other hyperparathyroidism: Secondary | ICD-10-CM

## 2021-11-15 NOTE — Telephone Encounter (Signed)
Tried to call pt, did not receive an answer and was unable to leave a message. 

## 2021-11-15 NOTE — Telephone Encounter (Signed)
Pt requesting to speak with [name]

## 2021-11-16 MED ORDER — CINACALCET HCL 30 MG PO TABS: 30.0000 mg | ORAL_TABLET | Freq: Every day | ORAL | 1 refills | Status: DC

## 2021-11-16 NOTE — Addendum Note (Signed)
Addended by: Ellin Saba on: 11/16/2021 11:15 AM   Modules accepted: Orders

## 2021-11-16 NOTE — Telephone Encounter (Signed)
Pt called and states the Cinacaclet (sensipar) 30mg  that was sent in 10/04/2021 for 3 months, she was in the donut [place] and could only pick up partial of the prescription. She states they told her they are going to need a new rx for the 3 month supply to be sent in. Please advise.  cinacalcet (SENSIPAR) 30 MG tablet   [facility] [phone] - [place], [address] Phone:  [phone]  Fax:  [phone]

## 2021-11-16 NOTE — Telephone Encounter (Signed)
Rx for 90 day supply sent to [facility] on [address]

## 2021-12-05 ENCOUNTER — Telehealth: Payer: Self-pay | Admitting: Family Medicine

## 2021-12-05 NOTE — Telephone Encounter (Signed)
Please schedule appointment for evaluation

## 2021-12-05 NOTE — Telephone Encounter (Signed)
Patient scheduled.

## 2021-12-05 NOTE — Telephone Encounter (Signed)
Pt states she has a yeast infection and would like a prescription. If she has to come in for an appt, she will. But she would prefer just to call something in.

## 2021-12-06 ENCOUNTER — Encounter: Payer: Self-pay | Admitting: Physician Assistant

## 2021-12-06 ENCOUNTER — Ambulatory Visit: Payer: HMO | Admitting: Family Medicine

## 2021-12-06 ENCOUNTER — Other Ambulatory Visit: Payer: Self-pay

## 2021-12-06 ENCOUNTER — Other Ambulatory Visit (HOSPITAL_COMMUNITY)
Admission: RE | Admit: 2021-12-06 | Discharge: 2021-12-06 | Disposition: A | Discharge status: Home / Self Care | Payer: HMO | Source: Ambulatory Visit | Attending: Family Medicine | Admitting: Family Medicine

## 2021-12-06 ENCOUNTER — Ambulatory Visit (INDEPENDENT_AMBULATORY_CARE_PROVIDER_SITE_OTHER): Payer: HMO | Admitting: Physician Assistant

## 2021-12-06 VITALS — BP 122/60 | HR 75 | Temp 97.9°F | Ht 60.0 in | Wt 155.0 lb

## 2021-12-06 DIAGNOSIS — N760 Acute vaginitis: Secondary | ICD-10-CM | POA: Insufficient documentation

## 2021-12-06 DIAGNOSIS — E119 Type 2 diabetes mellitus without complications: Secondary | ICD-10-CM

## 2021-12-06 MED ORDER — FLUCONAZOLE 150 MG PO TABS: 150.0000 mg | ORAL_TABLET | Freq: Once | ORAL | 0 refills | Status: AC

## 2021-12-06 NOTE — Progress Notes (Signed)
[name] is a 87 y.o. female here for possible yeast infection.  History of Present Illness:   Chief Complaint  Patient presents with   Vaginal Discharge    Pt c/o white vaginal clumpy discharge with itching since Friday night. Pt is using vagisil.    Vaginal Discharge [name] presents with c/o abnormal vaginal discharge and itchiness that has been onset since 12/02/21. Pt describes discharge to be white and clumpy which leads her to believe that she has a yeast infection. Reports she had a yeast infection a few weeks ago but after using an OTC 3 days suppository, sx resolved. In an effort to manage her itching, she has tried using vagisil but this has provided no relief.   Denies vaginal bleeding, changes in BM, foul smelling urine, or burning with urination.    Diabetes Current DM meds: jardiance 10 mg daily and januvia 100 mg daily. Blood sugars at home are: checked regularly, well controlled. Patient is compliant with medications, but has noticed since starting Jardiance 10 mg she has been having yeast injections.  Denies: hypoglycemic or hyperglycemic episodes or symptoms.   Lab Results  Component Value Date   HGBA1C 7.8 (A) 09/13/2021    Past Medical History:  Diagnosis Date   Arthritis    Cancer ([place])    Diabetes mellitus without complication (HCC)    GERD (gastroesophageal reflux disease)    Hyperlipidemia    Hypertension    Thyroid disease      Social History   Tobacco Use   Smoking status: Former    Packs/day: 1.00    Years: 15.00    Pack years: 15.00    Types: Cigarettes    Start date: 09/14/1955    Quit date: 07/14/1969    Years since quitting: 52.4   Smokeless tobacco: Never   Tobacco comments:    quit 40 years ago  Substance Use Topics   Alcohol use: Never    Alcohol/week: 0.0 standard drinks   Drug use: Never    Past Surgical History:  Procedure Laterality Date   BREAST SURGERY     MELANOMA EXCISION     TONSILLECTOMY      Family  History  Problem Relation Age of Onset   Cancer Mother    Hypertension Father     No Known Allergies  Current Medications:   Current Outpatient Medications:    blood glucose meter kit and supplies KIT, Dispense based on patient and insurance preference. Use up to four times daily as directed. (FOR ICD-9 250.00, 250.01)., Disp: 1 each, Rfl: 0   Cholecalciferol (VITAMIN D-3) 1000 UNITS CAPS, Take 2,000 Units by mouth 2 (two) times daily., Disp: , Rfl:    cinacalcet (SENSIPAR) 30 MG tablet, Take 1 tablet (30 mg total) by mouth daily with breakfast., Disp: 90 tablet, Rfl: 1   empagliflozin (JARDIANCE) 10 MG TABS tablet, Take 1 tablet (10 mg total) by mouth daily before breakfast., Disp: 90 tablet, Rfl: 3   glimepiride (AMARYL) 2 MG tablet, TAKE 1 TABLET(2 MG) BY MOUTH DAILY WITH BREAKFAST, Disp: 90 tablet, Rfl: 3   losartan (COZAAR) 50 MG tablet, TAKE 1 TABLET BY MOUTH EVERY DAY, Disp: 90 tablet, Rfl: 1   Multiple Vitamins-Minerals (CENTRUM SILVER 50+WOMEN PO), Take 1 tablet by mouth daily., Disp: , Rfl:    omeprazole (PRILOSEC) 20 MG capsule, TAKE 1 CAPSULE(20 MG) BY MOUTH DAILY, Disp: 90 capsule, Rfl: 3   ONETOUCH VERIO test strip, CHECK FOUR TIMES DAILY, Disp: 100 strip, Rfl:  3   rosuvastatin (CRESTOR) 10 MG tablet, TAKE 1 TABLET THREE TIMES WEEKLY MONDAY WEDNESDAY AND FRIDAY (Patient taking differently: daily.), Disp: 36 tablet, Rfl: 3   sitaGLIPtin (JANUVIA) 100 MG tablet, Take 1 tablet (100 mg total) by mouth daily., Disp: 90 tablet, Rfl: 3   sodium chloride (MURO 128) 2 % ophthalmic solution, Place 1 drop into both eyes 2 (two) times daily., Disp: , Rfl:    Review of Systems:   ROS Negative unless otherwise specified per HPI. Vitals:   Vitals:   12/06/21 1522  BP: 122/60  Pulse: 75  Temp: 97.9 F (36.6 C)  TempSrc: Temporal  SpO2: 97%  Weight: 155 lb (70.3 kg)  Height: 5' (1.524 m)     Body mass index is 30.27 kg/m.  Physical Exam:   Physical Exam Vitals and nursing  note reviewed. Exam conducted with a chaperone present [facility] CMA).  Constitutional:      General: She is not in acute distress.    Appearance: She is well-developed. She is not ill-appearing or toxic-appearing.  Cardiovascular:     Rate and Rhythm: Normal rate and regular rhythm.     Pulses: Normal pulses.     Heart sounds: Normal heart sounds, S1 normal and S2 normal.  Pulmonary:     Effort: Pulmonary effort is normal.     Breath sounds: Normal breath sounds.  Genitourinary:    Labia:        Right: No rash.        Left: No rash.      Vagina: Normal.  Skin:    General: Skin is warm and dry.  Neurological:     Mental Status: She is alert.     GCS: GCS eye subscore is 4. GCS verbal subscore is 5. GCS motor subscore is 6.  Psychiatric:        Speech: Speech normal.        Behavior: Behavior normal. Behavior is cooperative.    Assessment and Plan:   Acute vaginitis No red flags-- suspect vaginal yeast infection Vaginal swab collected-- will contact patient with results Take Diflucan 150 mg x 1 dose Provided patient with vaginal hygiene recommendations for yeast infection prevention Follow up if new/worsening symptoms or concerns occurs   Diabetes Well controlled per patient We did discuss that the Jardiance can increase the risk of recurrent yeast infections -- if persists, recommend close follow-up with PCP to discuss In the meantime, patient was instructed to continue medications as prescribed (Januvia 100 mg and Jardiance 10 mg)   I,Havlyn C Ratchford,acting as a Education administrator for [employer], PA.,have documented all relevant documentation on the behalf of [name], PA,as directed by  [name], PA while in the presence of [name], [name].  I, [name], [name], have reviewed all documentation for this visit. The documentation on 12/06/21 for the exam, diagnosis, procedures, and orders are all accurate and complete.   [name], PA-C

## 2021-12-06 NOTE — Patient Instructions (Signed)
It was great to see you!  Start oral diflucan -- one time dosage  I will be in touch with your vaginal swab results  Vaginal Hygiene Healthy practices for vaginal hygiene - Avoid pajamas. A robe / nightgown allows better air circulation. Sleep without panties / panties whenever possible. - Wear cotton panties / panties during the day. After washing the panties / panties, rinse them twice to avoid irritating residues. Do not use fabric softeners for panties and / or swimsuits. - Avoid tights, leotard, leggings, tight pants or other tight attire. Loose skirts and pants allow air to circulate. - Avoid the pantiprotector. Better use tampons or sanitary pads. It is beneficial to bathe with warm water every day: - Soak in clean water (without soap) for 10 to 15 minutes. It has not been specifically studied that adding vinegar or baking soda / baking soda to water is of benefit and may not be better than water alone. - Use soap to wash the other regions apart from the genital area before leaving the tub. Limit the use of soap in the genital areas. Use soaps without fragrance. - Rinse the genital area and pat dry. A hair dryer can only be used if cold air is used. - Do not use bubble bath or fragrant soap. - Do not use vaginal sprays or talcum powder. These contain chemicals that irritate the skin. - If the genital area is sore or swollen, fragrance-free disposable wipes can be used instead of toilet paper. - Emollients, such as Vaseline may help protect the skin and may be applied to the irritated area. - Always remember to clean yourself from front to back after bowel movements. Pat dry after urinating. - Don't keep the wet swimsuit on for a long time after swimming.   Take care,  [name] PA-C

## 2021-12-08 LAB — CERVICOVAGINAL ANCILLARY ONLY
Bacterial Vaginitis (gardnerella): NEGATIVE
Candida Glabrata: POSITIVE — AB
Candida Vaginitis: NEGATIVE
Comment: NEGATIVE
Comment: NEGATIVE
Comment: NEGATIVE

## 2021-12-12 ENCOUNTER — Encounter: Payer: Self-pay | Admitting: Family Medicine

## 2021-12-12 ENCOUNTER — Other Ambulatory Visit: Payer: Self-pay

## 2021-12-12 ENCOUNTER — Ambulatory Visit (INDEPENDENT_AMBULATORY_CARE_PROVIDER_SITE_OTHER): Payer: HMO | Admitting: Family Medicine

## 2021-12-12 VITALS — BP 134/72 | HR 75 | Temp 98.0°F | Ht 60.5 in | Wt 153.0 lb

## 2021-12-12 DIAGNOSIS — I10 Essential (primary) hypertension: Secondary | ICD-10-CM | POA: Diagnosis not present

## 2021-12-12 DIAGNOSIS — E119 Type 2 diabetes mellitus without complications: Secondary | ICD-10-CM | POA: Diagnosis not present

## 2021-12-12 LAB — POCT GLYCOSYLATED HEMOGLOBIN (HGB A1C): Hemoglobin A1C: 7.5 % — AB (ref 4.0–5.6)

## 2021-12-12 MED ORDER — FLUCONAZOLE 150 MG PO TABS: 150.0000 mg | ORAL_TABLET | ORAL | 0 refills | Status: DC | PRN

## 2021-12-12 NOTE — Assessment & Plan Note (Signed)
A1c 7.5.  She is having some issues with yeast infections on Jardiance.  We will stop.  Continue Januvia 100 mg daily and Amaryl 2 mg daily.  Follow-up in 3 months to recheck A1c.  May consider switching Januvia to Rybelsus or other GLP-1 agonist depending on A1c.

## 2021-12-12 NOTE — Assessment & Plan Note (Signed)
At goal on losartan 50 mg daily. 

## 2021-12-12 NOTE — Patient Instructions (Signed)
It was very nice to see you today!  We will stop Jardiance today.  Please take the Diflucan.  Come back to see me in 3 months.  Come back sooner if needed.  Take care, Dr Jerline Pain  PLEASE NOTE:  If you had any lab tests please let us know if you have not heard back within a few days. You may see your results on mychart before we have a chance to review them but we will give you a call once they are reviewed by [name]. If we ordered any referrals today, please let us know if you have not heard from their office within the next week.   Please try these tips to maintain a healthy lifestyle:  Eat at least 3 REAL meals and 1-2 snacks per day.  Aim for no more than 5 hours between eating.  If you eat breakfast, please do so within one hour of getting up.   Each meal should contain half fruits/vegetables, one quarter protein, and one quarter carbs (no bigger than a computer mouse)  Cut down on sweet beverages. This includes juice, soda, and sweet tea.   Drink at least 1 glass of water with each meal and aim for at least 8 glasses per day  Exercise at least 150 minutes every week.

## 2021-12-12 NOTE — Progress Notes (Signed)
° °  [name] is a 87 y.o. female who presents today for an office visit.  Assessment/Plan:  New/Acute Problems: Candidiasis Likely due to Jardiance.  We will stop today.  Send in another round of Diflucan.  She will let us know if not improving.  Chronic Problems Addressed Today: Essential hypertension At goal on losartan 50 mg daily.  Diabetes mellitus without complication (HCC) I3K 7.5.  She is having some issues with yeast infections on Jardiance.  We will stop.  Continue Januvia 100 mg daily and Amaryl 2 mg daily.  Follow-up in 3 months to recheck A1c.  May consider switching Januvia to Rybelsus or other GLP-1 agonist depending on A1c.     Subjective:  HPI:  Patient is here for diabetes follow-up.  Saw her 3 months ago.  Started on Jardiance.  Stop metformin due to diarrhea.  She developed a yeast infection.  Was seen here last week.  Started on Diflucan.  Still has some symptoms.       Objective:  Physical Exam: BP 134/72 (BP Location: Right Arm)    Pulse 75    Temp 98 F (36.7 C) (Temporal)    Ht 5' 0.5" (1.537 m)    Wt 153 lb (69.4 kg)    SpO2 98%    BMI 29.39 kg/m   Gen: No acute distress, resting comfortably CV: Regular rate and rhythm with no murmurs appreciated Pulm: Normal work of breathing, clear to auscultation bilaterally with no crackles, wheezes, or rhonchi Neuro: Grossly normal, moves all extremities Psych: Normal affect and thought content      [place] M. Jerline Pain, MD 12/12/2021 11:36 AM

## 2021-12-14 ENCOUNTER — Telehealth: Payer: Self-pay

## 2021-12-14 NOTE — Telephone Encounter (Signed)
Called pt to advise immunization record was updated. No answer and no vm to leave msg for patient.

## 2021-12-14 NOTE — Telephone Encounter (Signed)
Sent pt a MyChart message letting her know immunizations were updated.

## 2021-12-14 NOTE — Telephone Encounter (Signed)
Patient states she got her second booster for COVID on 11/01/21.  States it was the [employer].  Also, states got 1st shigrix today 12/14/2021.   Is requesting this to be documented in chart.

## 2022-01-05 ENCOUNTER — Ambulatory Visit (INDEPENDENT_AMBULATORY_CARE_PROVIDER_SITE_OTHER): Payer: HMO | Admitting: Family Medicine

## 2022-01-05 ENCOUNTER — Encounter: Payer: Self-pay | Admitting: Family Medicine

## 2022-01-05 VITALS — BP 128/74 | HR 76 | Temp 98.2°F | Ht 60.5 in | Wt 156.6 lb

## 2022-01-05 DIAGNOSIS — N200 Calculus of kidney: Secondary | ICD-10-CM | POA: Diagnosis not present

## 2022-01-05 DIAGNOSIS — N94819 Vulvodynia, unspecified: Secondary | ICD-10-CM | POA: Diagnosis not present

## 2022-01-05 DIAGNOSIS — J310 Chronic rhinitis: Secondary | ICD-10-CM | POA: Diagnosis not present

## 2022-01-05 DIAGNOSIS — I10 Essential (primary) hypertension: Secondary | ICD-10-CM | POA: Diagnosis not present

## 2022-01-05 DIAGNOSIS — E119 Type 2 diabetes mellitus without complications: Secondary | ICD-10-CM

## 2022-01-05 LAB — URINALYSIS, ROUTINE W REFLEX MICROSCOPIC
Bilirubin Urine: NEGATIVE
Ketones, ur: NEGATIVE
Nitrite: POSITIVE — AB
Specific Gravity, Urine: 1.01 (ref 1.000–1.030)
Total Protein, Urine: 30 — AB
Urine Glucose: >=1000 — AB
Urobilinogen, UA: 1 (ref 0.0–1.0)
pH: 6 (ref 5.0–8.0)

## 2022-01-05 MED ORDER — PHENAZOPYRIDINE HCL 200 MG PO TABS: 200.0000 mg | ORAL_TABLET | Freq: Three times a day (TID) | ORAL | 0 refills | Status: DC | PRN

## 2022-01-05 MED ORDER — ESTRADIOL 0.1 MG/GM VA CREA: 1.0000 | TOPICAL_CREAM | Freq: Every day | VAGINAL | 12 refills | Status: DC

## 2022-01-05 NOTE — Assessment & Plan Note (Signed)
Recommended Zyrtec as needed. ?

## 2022-01-05 NOTE — Assessment & Plan Note (Signed)
She is on Januvia 100 mg daily and Amaryl 2 mg daily.  She will follow-up in a couple months to recheck A1c.  Doing well off Jardiance. ?

## 2022-01-05 NOTE — Patient Instructions (Signed)
It was very nice to see you today! ? ?Please try the vaginal cream. ? ?We will check a urine sample today. ? ?Let me know if not improving in the next 1 to 2 weeks. ? ?Take care, ?Dr Jerline Pain ? ?PLEASE NOTE: ? ?If you had any lab tests please let us know if you have not heard back within a few days. You may see your results on mychart before we have a chance to review them but we will give you a call once they are reviewed by [name]. If we ordered any referrals today, please let us know if you have not heard from their office within the next week.  ? ?Please try these tips to maintain a healthy lifestyle: ? ?Eat at least 3 REAL meals and 1-2 snacks per day.  Aim for no more than 5 hours between eating.  If you eat breakfast, please do so within one hour of getting up.  ? ?Each meal should contain half fruits/vegetables, one quarter protein, and one quarter carbs (no bigger than a computer mouse) ? ?Cut down on sweet beverages. This includes juice, soda, and sweet tea.  ? ?Drink at least 1 glass of water with each meal and aim for at least 8 glasses per day ? ?Exercise at least 150 minutes every week.   ?

## 2022-01-05 NOTE — Assessment & Plan Note (Signed)
Follows with urology.  She is concerned this could be contributing some of her vaginal pain. We will check UA and urine culture to rule out UTI.  She can continue Pyridium as needed. ?

## 2022-01-05 NOTE — Assessment & Plan Note (Signed)
At goal on losartan 50 mg daily. 

## 2022-01-05 NOTE — Progress Notes (Signed)
? ?  [name] is a 87 y.o. female who presents today for an office visit. ? ?Assessment/Plan:  ?New/Acute Problems: ?Vaginal Pain ?Possibly sequela of recent yeast infection.  Also likely has some underlying atrophic vaginitis as well.  We will try Estrace cream.  We will check UA and urine culture rule out UTI.  If symptoms persist despite above would consider referral to gynecology. ? ?Chronic Problems Addressed Today: ?Nephrolithiasis ?Follows with urology.  She is concerned this could be contributing some of her vaginal pain. We will check UA and urine culture to rule out UTI.  She can continue Pyridium as needed. ? ?Rhinitis ?Recommended Zyrtec as needed. ? ?Diabetes mellitus without complication ([place]) ?She is on Januvia 100 mg daily and Amaryl 2 mg daily.  She will follow-up in a couple months to recheck A1c.  Doing well off Jardiance. ? ?Essential hypertension ?At goal on losartan '50mg'$  daily.  ? ? ?  ?Subjective:  ?HPI: ? ?Patient here with vaginal pain for the past month or so. She has had yeast infection about a month ago. Her symptoms include vaginal discharge and itchiness. This has resolved. Pain is present mostly at night. Symptoms comes and goes. Denies burning with urination or hematuria. Some urgency. She is still using some vagisil cream and this seems to be helping. She was also taking Pyridium for the pain. However, she stopped taking the medication. Symptoms seems to be worsening. She is concern that her pain might be related to her kidney stones. She was found to have a kidney stone which has not changed in size. She is following up with her urologist. She would like to get tested for this issue. ? ?   ?  ?Objective:  ?Physical Exam: ?BP 128/74 (BP Location: Right Arm)   Pulse 76   Temp 98.2 ?F (36.8 ?C) (Temporal)   Ht 5' 0.5" (1.537 m)   Wt 156 lb 9.6 oz (71 kg)   SpO2 98%   BMI 30.08 kg/m?   ?Gen: No acute distress, resting comfortably ?CV: Regular rate and rhythm with no murmurs  appreciated ?Pulm: Normal work of breathing, clear to auscultation bilaterally with no crackles, wheezes, or rhonchi ?Neuro: Grossly normal, moves all extremities ?Psych: Normal affect and thought content ? ?   ? ?I,Savera Zaman,acting as a Education administrator for Dimas Chyle, MD.,have documented all relevant documentation on the behalf of Dimas Chyle, MD,as directed by  Dimas Chyle, MD while in the presence of Dimas Chyle, MD.  ? ?I, Dimas Chyle, MD, have reviewed all documentation for this visit. The documentation on 01/05/22 for the exam, diagnosis, procedures, and orders are all accurate and complete. ? ?Algis Greenhouse. Jerline Pain, MD ?01/05/2022 1:25 PM  ? ?

## 2022-01-06 LAB — URINE CULTURE
MICRO NUMBER:: 13110010
SPECIMEN QUALITY:: ADEQUATE

## 2022-01-10 ENCOUNTER — Telehealth: Payer: Self-pay | Admitting: Family Medicine

## 2022-01-10 NOTE — Progress Notes (Signed)
Please inform patient of the following: ? ?Her urine culture was inconclusive however she did have some indicators of a UTI on her UA.  She should finish her antibiotics.  I would like for her to come back to recheck UA and urine culture later this week or next week to make sure it is clearing up. ? ?Algis Greenhouse. Jerline Pain, MD ?01/10/2022 9:42 AM  ?

## 2022-01-10 NOTE — Telephone Encounter (Signed)
Pt calling back re lab results- asking for a call back from dr parkers ma  ?

## 2022-01-11 ENCOUNTER — Other Ambulatory Visit: Payer: Self-pay

## 2022-01-11 ENCOUNTER — Other Ambulatory Visit: Payer: Self-pay | Admitting: *Deleted

## 2022-01-11 DIAGNOSIS — N390 Urinary tract infection, site not specified: Secondary | ICD-10-CM

## 2022-01-11 NOTE — Telephone Encounter (Signed)
See lab note.  

## 2022-01-13 ENCOUNTER — Other Ambulatory Visit (INDEPENDENT_AMBULATORY_CARE_PROVIDER_SITE_OTHER): Payer: HMO

## 2022-01-13 DIAGNOSIS — N390 Urinary tract infection, site not specified: Secondary | ICD-10-CM

## 2022-01-13 LAB — URINALYSIS, ROUTINE W REFLEX MICROSCOPIC
Bilirubin Urine: NEGATIVE
Ketones, ur: NEGATIVE
Nitrite: NEGATIVE
Specific Gravity, Urine: 1.01 (ref 1.000–1.030)
Urine Glucose: >=1000 — AB
Urobilinogen, UA: 0.2 (ref 0.0–1.0)
pH: 6 (ref 5.0–8.0)

## 2022-01-15 LAB — URINE CULTURE
MICRO NUMBER:: 13145582
SPECIMEN QUALITY:: ADEQUATE

## 2022-01-16 NOTE — Progress Notes (Signed)
Please inform patient of the following: ? ?Her urine culture is negative. Would like for her to let us know if her symptoms are not improving. ? ?Algis Greenhouse. Jerline Pain, MD ?01/16/2022 12:37 PM  ?

## 2022-01-23 ENCOUNTER — Other Ambulatory Visit: Payer: Self-pay | Admitting: Family Medicine

## 2022-01-27 ENCOUNTER — Telehealth: Payer: Self-pay | Admitting: Family Medicine

## 2022-01-27 ENCOUNTER — Other Ambulatory Visit: Payer: Self-pay

## 2022-01-27 ENCOUNTER — Telehealth: Payer: HMO | Admitting: Family

## 2022-01-27 MED ORDER — FLUCONAZOLE 150 MG PO TABS: 150.0000 mg | ORAL_TABLET | Freq: Once | ORAL | 0 refills | Status: AC

## 2022-01-27 NOTE — Telephone Encounter (Signed)
Yeast infection - would like medication to be called in.  ?

## 2022-01-27 NOTE — Telephone Encounter (Signed)
See note and advise °

## 2022-01-27 NOTE — Telephone Encounter (Signed)
Ok to send in one time dose of diflucan '150mg'$  once.  ? ?She needs visit if still not improving. ? ?[name]. Jerline Pain, MD ?01/27/2022 2:58 PM  ? ?

## 2022-01-27 NOTE — Telephone Encounter (Signed)
Rx sent to pharmacy and pt advised if not improving after does will need to call office to schedule an appt. Pt verbalized understanding  ?

## 2022-01-31 ENCOUNTER — Encounter: Payer: Self-pay | Admitting: Family Medicine

## 2022-01-31 ENCOUNTER — Ambulatory Visit (INDEPENDENT_AMBULATORY_CARE_PROVIDER_SITE_OTHER): Payer: HMO | Admitting: Family Medicine

## 2022-01-31 VITALS — BP 124/74 | HR 76 | Temp 98.6°F | Ht 60.5 in | Wt 157.8 lb

## 2022-01-31 DIAGNOSIS — E119 Type 2 diabetes mellitus without complications: Secondary | ICD-10-CM

## 2022-01-31 DIAGNOSIS — N761 Subacute and chronic vaginitis: Secondary | ICD-10-CM | POA: Diagnosis not present

## 2022-01-31 LAB — POCT GLUCOSE (DEVICE FOR HOME USE): Glucose Fasting, POC: 183 mg/dL — AB (ref 70–99)

## 2022-01-31 MED ORDER — OZEMPIC (0.25 OR 0.5 MG/DOSE) 2 MG/1.5ML ~~LOC~~ SOPN: 0.2500 mg | PEN_INJECTOR | SUBCUTANEOUS | 0 refills | Status: DC

## 2022-01-31 MED ORDER — NYSTATIN 100000 UNIT/GM EX CREA: 1.0000 "application " | TOPICAL_CREAM | Freq: Two times a day (BID) | CUTANEOUS | 0 refills | Status: DC

## 2022-01-31 NOTE — Patient Instructions (Addendum)
It was very nice to see you today! ? ?Please use the nystatin nightly for the next 2 weeks to help treat your yeast infection. ? ?Please stop the Januvia.  We will start Ozempic.  Please inject 0.25 mg weekly. ? ?Please come back to see me in 3 to 4 weeks for follow-up visit. ? ?Take care, ?Dr Jerline Pain ? ?PLEASE NOTE: ? ?If you had any lab tests please let us know if you have not heard back within a few days. You may see your results on mychart before we have a chance to review them but we will give you a call once they are reviewed by [name]. If we ordered any referrals today, please let us know if you have not heard from their office within the next week.  ? ?Please try these tips to maintain a healthy lifestyle: ? ?Eat at least 3 REAL meals and 1-2 snacks per day.  Aim for no more than 5 hours between eating.  If you eat breakfast, please do so within one hour of getting up.  ? ?Each meal should contain half fruits/vegetables, one quarter protein, and one quarter carbs (no bigger than a computer mouse) ? ?Cut down on sweet beverages. This includes juice, soda, and sweet tea.  ? ?Drink at least 1 glass of water with each meal and aim for at least 8 glasses per day ? ?Exercise at least 150 minutes every week.   ?

## 2022-01-31 NOTE — Assessment & Plan Note (Signed)
Sugars have not been controlled since we stopped Jardiance.  We discussed treatment options.  She has not tolerated metformin in the past.  We will avoid SGLT2 inhibitors due to her issues with recurrent yeast infections.  We will switch her Januvia to Ozempic 0.25 mg weekly.  We discussed potential side effects.  Samples given in the office today.  She will follow-up with me in 3 to 4 weeks and we can adjust the dose as needed. ?

## 2022-01-31 NOTE — Progress Notes (Signed)
? ?  [name] is a 87 y.o. female who presents today for an office visit. ? ?Assessment/Plan:  ?New/Acute Problems: ?Vaginitis ?Likely recurrent or inadequately treated Candida.  She did have Candida glabrata on swab a couple of months ago.  We will try vaginal nystatin for the next 2 weeks. ? ?Chronic Problems Addressed Today: ?Diabetes mellitus without complication ([place]) ?Sugars have not been controlled since we stopped Jardiance.  We discussed treatment options.  She has not tolerated metformin in the past.  We will avoid SGLT2 inhibitors due to her issues with recurrent yeast infections.  We will switch her Januvia to Ozempic 0.25 mg weekly.  We discussed potential side effects.  Samples given in the office today.  She will follow-up with me in 3 to 4 weeks and we can adjust the dose as needed. ? ? ?  ?Subjective:  ?HPI: ? ?Patient here with yeast infection. She still have some symptoms. Her symptoms seems to be not improving. symptoms include itching and burning. She has tried Vagisil with no improvement. Seen her about a month her. We prescribed Estrace cream at that time. She has tried Vagisil with no relief. Her sugar have been running high at home. Seems consistent with prior yeast infections. Denies any pain.  ? ?She is also concerned about her blood sugar. She notes her sugar have been in the 200's. Occasionally 300's. She notes her blood sugar usually 250's or 270's at home. She noticed this after stopping Jardiance due to side effects. She has been on Metformin in the past. She stopped this due to diarrhea. She is currently taking Januvia 100 mg daily. She is tolerating her medication well. No side effects. ? ? ?   ?  ?Objective:  ?Physical Exam: ?BP 124/74 (BP Location: Right Arm)   Pulse 76   Temp 98.6 ?F (37 ?C) (Temporal)   Ht 5' 0.5" (1.537 m)   Wt 157 lb 12.8 oz (71.6 kg)   SpO2 99%   BMI 30.31 kg/m?   ?Gen: No acute distress, resting comfortably ?CV: Regular rate and rhythm with no  murmurs appreciated ?Pulm: Normal work of breathing, clear to auscultation bilaterally with no crackles, wheezes, or rhonchi ?Neuro: Grossly normal, moves all extremities ?Psych: Normal affect and thought content ? ?   ? ? ?I,Savera Zaman,acting as a Education administrator for Dimas Chyle, MD.,have documented all relevant documentation on the behalf of Dimas Chyle, MD,as directed by  Dimas Chyle, MD while in the presence of Dimas Chyle, MD.  ? ?I, Dimas Chyle, MD, have reviewed all documentation for this visit. The documentation on 01/31/22 for the exam, diagnosis, procedures, and orders are all accurate and complete. ? ?Algis Greenhouse. Jerline Pain, MD ?01/31/2022 3:04 PM  ? ?

## 2022-02-16 ENCOUNTER — Other Ambulatory Visit: Payer: Self-pay | Admitting: Family Medicine

## 2022-02-27 ENCOUNTER — Encounter: Payer: Self-pay | Admitting: Family Medicine

## 2022-02-27 ENCOUNTER — Ambulatory Visit (INDEPENDENT_AMBULATORY_CARE_PROVIDER_SITE_OTHER): Payer: HMO | Admitting: Family Medicine

## 2022-02-27 ENCOUNTER — Telehealth: Payer: Self-pay | Admitting: Family Medicine

## 2022-02-27 VITALS — BP 142/79 | HR 70 | Temp 98.2°F | Ht 60.05 in | Wt 157.8 lb

## 2022-02-27 DIAGNOSIS — I10 Essential (primary) hypertension: Secondary | ICD-10-CM | POA: Diagnosis not present

## 2022-02-27 DIAGNOSIS — E119 Type 2 diabetes mellitus without complications: Secondary | ICD-10-CM | POA: Diagnosis not present

## 2022-02-27 LAB — POCT GLYCOSYLATED HEMOGLOBIN (HGB A1C): Hemoglobin A1C: 8.7 % — AB (ref 4.0–5.6)

## 2022-02-27 NOTE — Patient Instructions (Signed)
It was very nice to see you today! ? ?Please increase your Ozempic to 0.5 mg weekly. ? ?Come back in 3 months to recheck your A1c.  Please let me know if you have any side effects with the higher dose. ? ?Take care, ?Dr Jerline Pain ? ?PLEASE NOTE: ? ?If you had any lab tests please let us know if you have not heard back within a few days. You may see your results on mychart before we have a chance to review them but we will give you a call once they are reviewed by [name]. If we ordered any referrals today, please let us know if you have not heard from their office within the next week.  ? ?Please try these tips to maintain a healthy lifestyle: ? ?Eat at least 3 REAL meals and 1-2 snacks per day.  Aim for no more than 5 hours between eating.  If you eat breakfast, please do so within one hour of getting up.  ? ?Each meal should contain half fruits/vegetables, one quarter protein, and one quarter carbs (no bigger than a computer mouse) ? ?Cut down on sweet beverages. This includes juice, soda, and sweet tea.  ? ?Drink at least 1 glass of water with each meal and aim for at least 8 glasses per day ? ?Exercise at least 150 minutes every week.   ?

## 2022-02-27 NOTE — Assessment & Plan Note (Signed)
At goal on losartan 50 mg daily. 

## 2022-02-27 NOTE — Assessment & Plan Note (Addendum)
Sugars have been running a little high and her A1c is 8.7 today. We will increase her dose of ozempic to 0.'5mg'$  weekly. She has been tolerating the lower dose at 0.'25mg'$  weekly well without side effects. She will work on cutting down on her sugar intake.  ? ?We will recheck A1c in 3 months.  ?

## 2022-02-27 NOTE — Telephone Encounter (Signed)
Please advise 

## 2022-02-27 NOTE — Telephone Encounter (Signed)
Called pt and let her know I had sample for her and pt going to come pick up tomorrow.  ?

## 2022-02-27 NOTE — Telephone Encounter (Signed)
Pt is wondering of there are sample of the Ozempic that she can have.  ?

## 2022-02-27 NOTE — Telephone Encounter (Signed)
Ok to give sample if we have any. ? ?Algis Greenhouse. Jerline Pain, MD ?02/27/2022 2:29 PM  ? ?

## 2022-02-27 NOTE — Progress Notes (Addendum)
? ?  [name] is a 87 y.o. female who presents today for an office visit. ? ?Assessment/Plan:  ?New/Acute Problems: ?Sore Throat ?No red flags.  She can continue over-the-counter meds as needed.  She will let me know if not improving.  We discussed reasons to return to care. ? ?Chronic Problems Addressed Today: ?Essential hypertension ?At goal on losartan '50mg'$  daily.  ? ?Diabetes mellitus without complication ([place]) ?Sugars have been running a little high and her A1c is 8.7 today. We will increase her dose of ozempic to 0.'5mg'$  weekly. She has been tolerating the lower dose at 0.'25mg'$  weekly well without side effects. She will work on cutting down on her sugar intake.  ? ?We will recheck A1c in 3 months.  ? ?  ?Subjective:  ?HPI: ? ?See A/p for status of chronic conditions. ? ?She woke up this morning with a sore throat. She has been staying with her husband in the hospital who has also been dealing with a rhinovirus infection.  No fevers or chills.  Symptoms have improved by now.  She took some tessalon which helped.  ? ?   ?  ?Objective:  ?Physical Exam: ?BP (!) 142/79   Pulse 70   Temp 98.2 ?F (36.8 ?C) (Temporal)   Ht 5' 0.05" (1.525 m)   Wt 157 lb 12.8 oz (71.6 kg)   SpO2 96%   BMI 30.77 kg/m?   ?Gen: No acute distress, resting comfortably ?HEENT: OP slightly erythematous.  No lymphadenopathy. ?CV: Regular rate and rhythm with no murmurs appreciated ?Pulm: Normal work of breathing, clear to auscultation bilaterally with no crackles, wheezes, or rhonchi ?Neuro: Grossly normal, moves all extremities ?Psych: Normal affect and thought content ? ?   ? ?Algis Greenhouse. Jerline Pain, MD ?02/27/2022 9:23 AM  ?

## 2022-03-03 ENCOUNTER — Encounter: Payer: Self-pay | Admitting: Family Medicine

## 2022-03-03 ENCOUNTER — Ambulatory Visit (INDEPENDENT_AMBULATORY_CARE_PROVIDER_SITE_OTHER): Payer: HMO | Admitting: Family Medicine

## 2022-03-03 VITALS — BP 165/80 | HR 76 | Temp 98.3°F | Ht 60.5 in | Wt 159.0 lb

## 2022-03-03 DIAGNOSIS — J029 Acute pharyngitis, unspecified: Secondary | ICD-10-CM

## 2022-03-03 DIAGNOSIS — E1159 Type 2 diabetes mellitus with other circulatory complications: Secondary | ICD-10-CM | POA: Diagnosis not present

## 2022-03-03 DIAGNOSIS — I152 Hypertension secondary to endocrine disorders: Secondary | ICD-10-CM

## 2022-03-03 DIAGNOSIS — J310 Chronic rhinitis: Secondary | ICD-10-CM

## 2022-03-03 LAB — POCT RAPID STREP A (OFFICE): Rapid Strep A Screen: NEGATIVE

## 2022-03-03 MED ORDER — AMOXICILLIN-POT CLAVULANATE 875-125 MG PO TABS: 1.0000 | ORAL_TABLET | Freq: Two times a day (BID) | ORAL | 0 refills | Status: DC

## 2022-03-03 MED ORDER — AZELASTINE HCL 0.1 % NA SOLN: 2.0000 | Freq: Two times a day (BID) | NASAL | 12 refills | Status: DC

## 2022-03-03 NOTE — Patient Instructions (Signed)
It was very nice to see you today! ? ?Please start the nasal spray.  Start antibiotic if not improving in a couple days.  Let me know if not improving by next week.  Please make sure that you are getting plenty of fluids and staying well-hydrated. ? ?Take care, ?Dr Jerline Pain ? ?PLEASE NOTE: ? ?If you had any lab tests please let us know if you have not heard back within a few days. You may see your results on mychart before we have a chance to review them but we will give you a call once they are reviewed by [name]. If we ordered any referrals today, please let us know if you have not heard from their office within the next week.  ? ?Please try these tips to maintain a healthy lifestyle: ? ?Eat at least 3 REAL meals and 1-2 snacks per day.  Aim for no more than 5 hours between eating.  If you eat breakfast, please do so within one hour of getting up.  ? ?Each meal should contain half fruits/vegetables, one quarter protein, and one quarter carbs (no bigger than a computer mouse) ? ?Cut down on sweet beverages. This includes juice, soda, and sweet tea.  ? ?Drink at least 1 glass of water with each meal and aim for at least 8 glasses per day ? ?Exercise at least 150 minutes every week.   ?

## 2022-03-03 NOTE — Progress Notes (Signed)
? ?  [name] is a 87 y.o. female who presents today for an office visit. ? ?Assessment/Plan:  ?New/Acute Problems: ?Sinusitis ?No red flags.  Start Astelin nasal spray.  Rapid strep here was negative.  We will send in pocket prescription for Augmentin with instruction not start with symptoms or not proving the next few days.  She asked about steroid injection however given her recent elevated A1c we decided to defer for now however if symptoms or not proving with above we will consider this next week.  She can continue over-the-counter meds.  Encouraged hydration.  We discussed reasons to return to care.  Follow-up as needed. ? ?Chronic Problems Addressed Today: ?Rhinitis ?We will be starting Astelin as above which should help. ? ?Hypertension associated with diabetes ([place]) ?Above goal today though typically well controlled on losartan 50 mg daily.  She will continue home monitoring and let us know if persistently elevated. ? ? ?  ?Subjective:  ?HPI: ? ?Patient here with cough and congestion.  Started about a week ago.  Also with sore throat.  She has been visiting her husband in the hospital.  She is not sure if she has had any sick contacts.  She has been trying cough syrup with honey with some improvement.  No reported fevers or chills. ? ?   ?  ?Objective:  ?Physical Exam: ?BP (!) 165/80 (BP Location: Left Arm)   Pulse 76   Temp 98.3 ?F (36.8 ?C) (Temporal)   Ht 5' 0.5" (1.537 m)   Wt 159 lb (72.1 kg)   SpO2 95%   BMI 30.54 kg/m?   ?Gen: No acute distress, resting comfortably ?HEENT: TMs with clear effusion.  OP erythematous.  His mucosa erythematous and boggy bilaterally. ?CV: Regular rate and rhythm with no murmurs appreciated ?Pulm: Normal work of breathing, clear to auscultation bilaterally with no crackles, wheezes, or rhonchi ?Neuro: Grossly normal, moves all extremities ?Psych: Normal affect and thought content ? ?   ? ?Algis Greenhouse. Jerline Pain, MD ?03/03/2022 10:04 AM  ?

## 2022-03-03 NOTE — Assessment & Plan Note (Signed)
We will be starting Astelin as above which should help. ?

## 2022-03-03 NOTE — Assessment & Plan Note (Signed)
Above goal today though typically well controlled on losartan 50 mg daily.  She will continue home monitoring and let us know if persistently elevated. ?

## 2022-03-28 ENCOUNTER — Telehealth: Payer: Self-pay | Admitting: "Endocrinology

## 2022-03-28 NOTE — Telephone Encounter (Signed)
Scheduled for 6/5 at 12:30. Pt notified by VM.

## 2022-03-28 NOTE — Telephone Encounter (Signed)
Pt is sch for a follow up 6/5 next week but I do not see she has been scheduled for her DXA. Can you check on this.

## 2022-03-29 DIAGNOSIS — E212 Other hyperparathyroidism: Secondary | ICD-10-CM | POA: Diagnosis not present

## 2022-03-30 LAB — PTH, INTACT AND CALCIUM
Calcium: 10.6 mg/dL — ABNORMAL HIGH (ref 8.7–10.3)
PTH: 38 pg/mL (ref 15–65)

## 2022-04-03 ENCOUNTER — Other Ambulatory Visit (HOSPITAL_COMMUNITY): Payer: HMO

## 2022-04-04 ENCOUNTER — Ambulatory Visit: Payer: HMO | Admitting: "Endocrinology

## 2022-04-04 ENCOUNTER — Encounter: Payer: Self-pay | Admitting: "Endocrinology

## 2022-04-04 VITALS — BP 128/58 | HR 80 | Ht 60.5 in | Wt 158.2 lb

## 2022-04-04 DIAGNOSIS — E212 Other hyperparathyroidism: Secondary | ICD-10-CM | POA: Diagnosis not present

## 2022-04-04 NOTE — Progress Notes (Unsigned)
04/04/2022, 5:53 PM       Endocrinology follow-up note   [name] is a 87 y.o.-year-old female, here for follow-up of hypercalcemia related to hyperparathyroidism. PMD: her  [name], MD    Past Medical History:  Diagnosis Date   Arthritis    Cancer [facility])    Diabetes mellitus without complication ([place])    GERD (gastroesophageal reflux disease)    Hyperlipidemia    Hypertension    Thyroid disease     Past Surgical History:  Procedure Laterality Date   BREAST SURGERY     MELANOMA EXCISION     TONSILLECTOMY      Social History   Tobacco Use   Smoking status: Former    Packs/day: 1.00    Years: 15.00    Pack years: 15.00    Types: Cigarettes    Start date: 09/14/1955    Quit date: 07/14/1969    Years since quitting: 52.7   Smokeless tobacco: Never   Tobacco comments:    quit 40 years ago  Substance Use Topics   Alcohol use: Never    Alcohol/week: 0.0 standard drinks   Drug use: Never    Family History  Problem Relation Age of Onset   Cancer Mother    Hypertension Father     Outpatient Encounter Medications as of 04/04/2022  Medication Sig   azelastine (ASTELIN) 0.1 % nasal spray Place 2 sprays into both nostrils 2 (two) times daily. (Patient not taking: Reported on 04/04/2022)   blood glucose meter kit and supplies KIT Dispense based on patient and insurance preference. Use up to four times daily as directed. (FOR ICD-9 250.00, 250.01).   Cholecalciferol (VITAMIN D-3) 1000 UNITS CAPS Take 2,000 Units by mouth 2 (two) times daily.   cinacalcet (SENSIPAR) 30 MG tablet Take 1 tablet (30 mg total) by mouth daily with breakfast.   estradiol (ESTRACE VAGINAL) 0.1 MG/GM vaginal cream Place 1 Applicatorful vaginally at bedtime. (Patient not taking: Reported on 04/04/2022)   glimepiride (AMARYL) 2 MG tablet TAKE 1 TABLET(2 MG) BY MOUTH DAILY WITH BREAKFAST   losartan (COZAAR) 50 MG tablet TAKE  1 TABLET BY MOUTH EVERY DAY   Multiple Vitamins-Minerals (CENTRUM SILVER 50+WOMEN PO) Take 1 tablet by mouth daily.   nystatin cream (MYCOSTATIN) Apply 1 application. topically 2 (two) times daily.   omeprazole (PRILOSEC) 20 MG capsule TAKE 1 CAPSULE(20 MG) BY MOUTH DAILY   ONETOUCH VERIO test strip TEST FOUR TIMES DAILY   phenazopyridine (PYRIDIUM) 200 MG tablet Take 1 tablet (200 mg total) by mouth 3 (three) times daily as needed for pain.   rosuvastatin (CRESTOR) 10 MG tablet TAKE 1 TABLET THREE TIMES WEEKLY MONDAY WEDNESDAY AND FRIDAY (Patient taking differently: daily.)   Semaglutide,0.25 or 0.5MG/DOS, (OZEMPIC, 0.25 OR 0.5 MG/DOSE,) 2 MG/1.5ML SOPN Inject 0.25 mg into the skin once a week.   sodium chloride (MURO 128) 2 % ophthalmic solution Place 1 drop into both eyes 2 (two) times daily.   [DISCONTINUED] amoxicillin-clavulanate (AUGMENTIN) 875-125 MG tablet Take 1 tablet by mouth 2 (two) times daily.   No facility-administered encounter medications on file as  of 04/04/2022.    No Known Allergies   HPI  [name] was diagnosed with hypercalcemia in May 2020.   Prior to her last visit, her work-up to help determine the hypercalcemia to be secondary to early mild primary hyperparathyroidism.   -Since she is not a surgical candidate, she was started on Sensipar, currently on 30 mg p.o. every other day with breakfast.  She continues to tolerate this medication.  Her previsit labs show improved calcium of 10.6 from 11.4 .   She has some muscle cramps as a new complaint today.  She has diabetes  now better controlled at a1c of 7.8% from  9.4%. She also has hyperlipidemia uncontrolled on Crestor 10 mg.  She is known to have recurrent nephrolithiasis.  No history of CKD.   she is not on HCTZ or other thiazide therapy.  She is on vitamin D supplement.  She is not on calcium supplements. she eats dairy and green, leafy, vegetables on average amounts.  she does not have a family  history of hypercalcemia, pituitary tumors, thyroid cancer, or osteoporosis.  -On history, she lost 2 inches of height over the years.  I reviewed her chart and she also has a history of controlled type 2 diabetes on Metformin, recent A1c of 6.6%.    ROS: Limited as above.  PE: BP (!) 128/58   Pulse 80   Ht 5' 0.5" (1.537 m)   Wt 158 lb 3.2 oz (71.8 kg)   BMI 30.39 kg/m , Body mass index is 30.39 kg/m. Wt Readings from Last 3 Encounters:  04/04/22 158 lb 3.2 oz (71.8 kg)  03/03/22 159 lb (72.1 kg)  02/27/22 157 lb 12.8 oz (71.6 kg)       CMP     Component Value Date/Time   NA 137 03/18/2021 1039   NA 140 03/12/2018 0000   NA 145 02/15/2017 1333   NA 144 08/12/2015 1419   K 4.8 03/18/2021 1039   K 4.4 02/15/2017 1333   K 4.2 08/12/2015 1419   CL 102 03/18/2021 1039   CL 104 02/15/2017 1333   CO2 25 03/18/2021 1039   CO2 28 02/15/2017 1333   CO2 29 08/12/2015 1419   GLUCOSE 281 (H) 03/18/2021 1039   GLUCOSE 142 (H) 02/15/2017 1333   BUN 12 03/18/2021 1039   BUN 11 03/12/2018 0000   BUN 13 02/15/2017 1333   BUN 11.8 08/12/2015 1419   CREATININE 0.94 03/18/2021 1039   CREATININE 0.82 03/24/2020 1122   CREATININE 0.8 08/12/2015 1419   CALCIUM 10.6 (H) 03/29/2022 0957   CALCIUM 10.2 02/15/2017 1333   CALCIUM 11.1 (H) 08/12/2015 1419   PROT 7.7 03/18/2021 1039   PROT 7.5 02/15/2017 1333   PROT 7.2 08/12/2015 1419   ALBUMIN 4.4 03/18/2021 1039   ALBUMIN 150 MG/L 07/15/2019 1130   ALBUMIN 3.9 08/12/2015 1419   AST 20 03/18/2021 1039   AST 32 02/15/2017 1333   AST 26 08/12/2015 1419   ALT 24 03/18/2021 1039   ALT 39 02/15/2017 1333   ALT 32 08/12/2015 1419   ALKPHOS 89 03/18/2021 1039   ALKPHOS 76 02/15/2017 1333   ALKPHOS 74 08/12/2015 1419   BILITOT 0.7 03/18/2021 1039   BILITOT 0.90 02/15/2017 1333   BILITOT 0.52 08/12/2015 1419   GFRNONAA 67 03/24/2020 1122   GFRAA 77 03/24/2020 1122     Diabetic Labs (most recent): Lab Results  Component Value  Date   HGBA1C 8.7 (A) 02/27/2022  HGBA1C 7.5 (A) 12/12/2021   HGBA1C 7.8 (A) 09/13/2021     Lipid Panel ( most recent) Lipid Panel     Component Value Date/Time   CHOL 159 03/18/2021 1039   TRIG 368.0 (H) 03/18/2021 1039   HDL 30.10 (L) 03/18/2021 1039   CHOLHDL 5 03/18/2021 1039   VLDL 73.6 (H) 03/18/2021 1039   LDLCALC 86 11/06/2019 0836   LDLDIRECT 91.0 03/18/2021 1039      Lab Results  Component Value Date   TSH 6.29 (H) 04/18/2021   TSH 5.27 (H) 03/18/2021   TSH 3.96 11/21/2019   TSH 4.00 03/17/2019   TSH 4.36 03/12/2018   TSH 2.93 03/12/2017   FREET4 0.58 (L) 04/18/2021      Assessment: 1. Hypercalcemia / Hyperparathyroidism  Plan: See notes from prior visits.  She returns with a improved calcium at 10.6 responding  to appropriate dose of Sensipar.  She is advised to continue  Sensipar 30 mg p.o. every day with plan to measure PTH/calcium in 3 months.  PTH at 78. - Patient also  has vitamin D deficiency on supplement, most current vitamin D was 39. -PTH RP is normal, malignancy related hypercalcemia unlikely. -There seems to be a complication from hypercalcemia/hypocalcemia with 74m nonobstructive right sided nephrolithiasis.  no history of   osteoporosis,fragility fractures. No abdominal pain, no major mood disorders, no bone pain-she is encouraged to obtain her bone density.  -She is not a surgical candidate and wishes to avoid surgery.      -She does not have recent bone density to review.  She will  need DEXA scan to include the distal  33% of  radius for evaluation of cortical bone on her subsequent visits.  She has uncontrolled type 2 diabetes, better a1c of 7.8% . She is addressing with her PMD.  She is advised to maintain close follow-up with her PCP.  I spent  21 minutes in the care of the patient today including review of labs from Thyroid Function, CMP, and other relevant labs ; imaging/biopsy records (current and previous including abstractions  from other facilities); face-to-face time discussing  her lab results and symptoms, medications doses, her options of short and long term treatment based on the latest standards of care / guidelines;   and documenting the encounter.  ERubie MaidPresnell  participated in the discussions, expressed understanding, and voiced agreement with the above plans.  All questions were answered to her satisfaction. she is encouraged to contact clinic should she have any questions or concerns prior to her return visit.    - Return in about 6 months (around 10/04/2022) for F/U with Pre-visit Labs, DXA Scan B4 NV.   GGlade Lloyd MD [facility] [address]Phone: [phone] Fax: [phone]   This note was partially dictated with voice recognition software. Similar sounding words can be transcribed inadequately or may not  be corrected upon review.  04/04/2022, 5:53 PM

## 2022-04-05 ENCOUNTER — Telehealth: Payer: Self-pay

## 2022-04-05 DIAGNOSIS — L570 Actinic keratosis: Secondary | ICD-10-CM | POA: Diagnosis not present

## 2022-04-05 DIAGNOSIS — Z85820 Personal history of malignant melanoma of skin: Secondary | ICD-10-CM | POA: Diagnosis not present

## 2022-04-05 DIAGNOSIS — Z1283 Encounter for screening for malignant neoplasm of skin: Secondary | ICD-10-CM | POA: Diagnosis not present

## 2022-04-05 DIAGNOSIS — D485 Neoplasm of uncertain behavior of skin: Secondary | ICD-10-CM | POA: Diagnosis not present

## 2022-04-05 DIAGNOSIS — L820 Inflamed seborrheic keratosis: Secondary | ICD-10-CM | POA: Diagnosis not present

## 2022-04-05 NOTE — Telephone Encounter (Signed)
Left a message requesting pt return call to the office. ?

## 2022-04-07 DIAGNOSIS — Z1231 Encounter for screening mammogram for malignant neoplasm of breast: Secondary | ICD-10-CM | POA: Diagnosis not present

## 2022-04-07 LAB — HM MAMMOGRAPHY

## 2022-04-13 ENCOUNTER — Ambulatory Visit (HOSPITAL_COMMUNITY)
Admission: RE | Admit: 2022-04-13 | Discharge: 2022-04-13 | Disposition: A | Discharge status: Home / Self Care | Payer: HMO | Source: Ambulatory Visit | Attending: "Endocrinology | Admitting: "Endocrinology

## 2022-04-13 DIAGNOSIS — M8589 Other specified disorders of bone density and structure, multiple sites: Secondary | ICD-10-CM | POA: Insufficient documentation

## 2022-04-13 DIAGNOSIS — Z780 Asymptomatic menopausal state: Secondary | ICD-10-CM | POA: Diagnosis not present

## 2022-04-13 DIAGNOSIS — E212 Other hyperparathyroidism: Secondary | ICD-10-CM | POA: Insufficient documentation

## 2022-04-13 DIAGNOSIS — Z13820 Encounter for screening for osteoporosis: Secondary | ICD-10-CM | POA: Diagnosis not present

## 2022-04-13 DIAGNOSIS — Z853 Personal history of malignant neoplasm of breast: Secondary | ICD-10-CM | POA: Insufficient documentation

## 2022-04-15 ENCOUNTER — Encounter: Payer: Self-pay | Admitting: Family Medicine

## 2022-04-15 DIAGNOSIS — M8580 Other specified disorders of bone density and structure, unspecified site: Secondary | ICD-10-CM | POA: Insufficient documentation

## 2022-04-15 NOTE — Progress Notes (Signed)
Please inform patient of the following:  HEr bone density scan shows that she has mild thinning of the bones called osteopenia. We do not need to start any medications at this point but we should optimize her calcium and vitamin D. Endocrinology is helping with this.  We can recheck again in 2 years.

## 2022-04-17 ENCOUNTER — Telehealth: Payer: Self-pay | Admitting: Family Medicine

## 2022-04-17 ENCOUNTER — Other Ambulatory Visit: Payer: Self-pay | Admitting: *Deleted

## 2022-04-17 ENCOUNTER — Telehealth: Payer: Self-pay | Admitting: *Deleted

## 2022-04-17 MED ORDER — OZEMPIC (0.25 OR 0.5 MG/DOSE) 2 MG/1.5ML ~~LOC~~ SOPN
0.2500 mg | PEN_INJECTOR | SUBCUTANEOUS | 0 refills | Status: DC
Start: 2022-04-17 — End: 2022-05-29

## 2022-04-17 NOTE — Telephone Encounter (Signed)
Also, Patient requests samples of Ozempic.  Patient states if no samples are available, Patient requests a new RX for Ozempic be sent to:  [id number] - [place], [address] Phone:  [phone]  Fax:  [phone]     Patient's contact ph# [phone]

## 2022-04-17 NOTE — Telephone Encounter (Signed)
Rx send to [facility] pharmacy  ?

## 2022-04-17 NOTE — Telephone Encounter (Signed)
  Patient Name: [name] Gender: Female DOB: 1935-08-28 Age: 87 Y 17 D Return Phone Number: [phone] (Primary) Address: City/ State/ Zip: [place] [address] Client [place] [facility] [place] Client Site [facility] [facility] [place] [facility] Provider Dimas Chyle- MD Contact Type Call Who Is Calling Patient / Member / Family / Caregiver Call Type Triage / Clinical Relationship To Patient Self Return Phone Number [phone] (Primary) Chief Complaint Leg Pain Reason for Call Symptomatic / Request for [place] states they are [name] from the office who has a patient on the line needing triage. The patient states they see Dr. Jerline Pain and have had severe pain in the back of her knee a few weeks ago. The patient took Tylenol and it relieved the pain. After seeing a chiropractor they were told the pain may be from a blood clot behind the knee. The patient does have a family history of blood clots. Translation No Nurse Assessment Nurse: Mariea Clonts, RN, Cristan Date/Time (Eastern Time): 04/17/2022 11:25:23 AM Confirm and document reason for call. If symptomatic, describe symptoms. ---The patient states they see Dr. Jerline Pain and have had severe pain in the back of her knee a few weeks ago. The patient took Tylenol and it relieved the pain. After seeing a chiropractor they were told the pain may be from a blood clot behind the knee. The patient does have a family history of blood clots. Pain began 2-3 weeks ago, no current issue no current pain, however reports left leg was hurting last night Does the patient have any new or worsening symptoms? ---Yes Will a triage be completed? ---Yes Related visit to physician within the last 2 weeks? ---No Does the PT have any chronic conditions? (i.e. diabetes, asthma, this includes High risk factors for pregnancy, etc.) ---Yes List chronic conditions. ---DM Is this  a behavioral health or substance abuse call? ---No Guidelines Guideline Title Affirmed Question Affirmed Notes Nurse Date/Time Eilene Ghazi Time) Leg Pain History of inherited increased risk of blood clots (e.g., Factor 5 Leiden, Anti-thrombin 3, Protein C or Protein S deficiency, Prothrombin mutation) Reed, Therapist, sports, Cristan 04/17/2022 11:26:06 AM Disp. Time Eilene Ghazi Time) Disposition Final User 04/17/2022 11:37:41 AM See HCP within 4 Hours (or PCP triage) Yes Mariea Clonts, RN, Cristan Caller Disagree/Comply Comply Caller Understands Yes PreDisposition Did not know what to do Care Advice Given Per Guideline SEE HCP (OR PCP TRIAGE) WITHIN 4 HOURS: * IF OFFICE WILL BE OPEN: You need to be seen within the next 3 or 4 hours. Call your doctor (or NP/PA) now or as soon as the office opens. CALL BACK IF: * You become worse CARE ADVICE given per Leg Pain (Adult) guideline. Comments User: Luis Abed, RN Date/Time Eilene Ghazi Time): 04/17/2022 11:28:36 AM pain back of the leg behind knee User: Luis Abed, RN Date/Time (Eastern Time): 04/17/2022 11:37:36 AM Caller transferred to backline for appointmetnt, caller educated on importance of being seen within the next 3-4 hours, however is refusing, wanting to be seen today or tomorrow by her pcp. Referrals REFERRED TO PCP OFFICE

## 2022-04-17 NOTE — Telephone Encounter (Signed)
Patient need OV with any available provider for evaluation

## 2022-04-17 NOTE — Telephone Encounter (Signed)
Please see message. Pt refused to be seen till tomorrow.

## 2022-04-17 NOTE — Telephone Encounter (Signed)
Triage transferred call back to [name] stating pt needs to be seen within 3-4 hrs. No appts available in this office. Pt refused to be seen elsewhere or urgent care. She stated it is not an urgent need but agreed to come in to see [place] on 04/18/22 at 11:20 am.

## 2022-04-17 NOTE — Telephone Encounter (Signed)
PA (Key: [id number]) Send today 04/17/2022 Rx #: [id number] Ozempic (0.25 or 0.5 MG/DOSE) '2MG'$ /3ML pen-injectors Waiting for determination

## 2022-04-17 NOTE — Telephone Encounter (Signed)
Patient called to let Dr. Jerline Pain know that Patient had excruciating pain in her left leg below and above the knee on the back side of the knee approx. 2 weeks ago. Patient took Tylenol which provided relief the next day (just a little tender) and then it was gone. Pain states pain has been gone for a couple of weeks. Patient went to see a Chiropractor at her monthly appointment and the Chiropractor told Patient that the pain may have been caused by a small blood clot behind the knee.  Patient's contact ph# [phone]  Patient transferred to Triage

## 2022-04-18 ENCOUNTER — Ambulatory Visit (INDEPENDENT_AMBULATORY_CARE_PROVIDER_SITE_OTHER): Payer: HMO | Admitting: Physician Assistant

## 2022-04-18 ENCOUNTER — Encounter: Payer: Self-pay | Admitting: Physician Assistant

## 2022-04-18 ENCOUNTER — Ambulatory Visit (HOSPITAL_COMMUNITY)
Admission: RE | Admit: 2022-04-18 | Discharge: 2022-04-18 | Disposition: A | Discharge status: Home / Self Care | Payer: HMO | Source: Ambulatory Visit | Attending: Physician Assistant | Admitting: Physician Assistant

## 2022-04-18 ENCOUNTER — Telehealth: Payer: Self-pay | Admitting: Family Medicine

## 2022-04-18 VITALS — BP 130/70 | HR 76 | Temp 97.2°F | Ht 60.0 in | Wt 157.2 lb

## 2022-04-18 DIAGNOSIS — E119 Type 2 diabetes mellitus without complications: Secondary | ICD-10-CM | POA: Diagnosis not present

## 2022-04-18 DIAGNOSIS — M79605 Pain in left leg: Secondary | ICD-10-CM

## 2022-04-18 MED ORDER — OZEMPIC (0.25 OR 0.5 MG/DOSE) 2 MG/3ML ~~LOC~~ SOPN: 0.2500 mg | PEN_INJECTOR | SUBCUTANEOUS | 0 refills | Status: DC

## 2022-04-18 NOTE — Patient Instructions (Signed)
It was great to see you!  We will get an ultrasound of your leg  If any new or worsening symptoms, please let us know  Take care,  [name] PA-C

## 2022-04-18 NOTE — Telephone Encounter (Signed)
Checked Covermymeds PA was approved for Ozempic. Called pharmacy and spoke to [place] told her Ozempic was approved. Janett Billow verbalized understanding and said will get ready for pt. Asked her how much for the Rx. Janett Billow said it will be $49.20.   Pt notified that Ozempic was approved and cost. Pt verbalized understanding.

## 2022-04-18 NOTE — Telephone Encounter (Signed)
Fyi- i called pt to get her sch. She stated she could not do today or tomorrow. i explained to her that it's a stat. So she was like ok. i called Butch Penny at [facility]. She's trying to locate a appt for today she will call pt to sch appt.

## 2022-04-18 NOTE — Progress Notes (Signed)
[name] is a 87 y.o. female here for a new problem leg pain.   History of Present Illness:   Chief Complaint  Patient presents with   Left leg pain    Pt had episode on 5/3 where she was woken up out of her sleep, pain left leg behind knee radiating up and down back of leg. Took 2 Tylenol then, next day still having some pain took Tylenol again, gone by end of day. She has not had any pain since. She told her Chiropractor on 6/7, he told her she could of had a small blood clot and she should have it checked.    HPI  Leg Pain  Patient complain of left leg pain that has occurred once about 1-2 months ago. States she had severe leg pain behind knee on May 3rd and this woke her up from sleep. Pain radiating up and down back of her leg. States she has taken two Tylenol during that night and tried to go back to sleep. Pain lasted for several hours. States she was able to go back to sleep with this pain. She has tried taking two Tylenol during the morning with some relief. Symptoms were resolved later that day. States she was not walking as usual due to the weather at that time. She has seen her Chiropractor on 6/7 and when she described her symptoms, she was told she may have had a small blood clot. No shortness of breath or chest pain. Denies dizziness or lightheadedness. No reported leg swelling or pain. Denies any hx of blood clot. Does admit she had family hx of DVT in her nephew. States her symptoms felt similar to what her nephew had.   Diabetes  Patient is currently taking glimepiride 2 mg daily, Ozempic 0.25 mg weekly with no complication. She was recently started on Ozempic by her PCP - was given a sample in our office. She is tolerating her medication with no side effects.  Denies: hypoglycemic or hyperglycemic episodes or symptoms. She is wondering if she can get another sample.   Past Medical History:  Diagnosis Date   Arthritis    Cancer ([place])    Diabetes mellitus without  complication ([facility])    GERD (gastroesophageal reflux disease)    Hyperlipidemia    Hypertension    Thyroid disease      Social History   Tobacco Use   Smoking status: Former    Packs/day: 1.00    Years: 15.00    Total pack years: 15.00    Types: Cigarettes    Start date: 09/14/1955    Quit date: 07/14/1969    Years since quitting: 52.7   Smokeless tobacco: Never   Tobacco comments:    quit 40 years ago  Substance Use Topics   Alcohol use: Never    Alcohol/week: 0.0 standard drinks of alcohol   Drug use: Never    Past Surgical History:  Procedure Laterality Date   BREAST SURGERY     MELANOMA EXCISION     TONSILLECTOMY      Family History  Problem Relation Age of Onset   Cancer Mother    Hypertension Father     No Known Allergies  Current Medications:   Current Outpatient Medications:    azelastine (ASTELIN) 0.1 % nasal spray, Place 2 sprays into both nostrils 2 (two) times daily., Disp: 30 mL, Rfl: 12   blood glucose meter kit and supplies KIT, Dispense based on patient and insurance preference. Use up  to four times daily as directed. (FOR ICD-9 250.00, 250.01)., Disp: 1 each, Rfl: 0   Cholecalciferol (VITAMIN D-3) 1000 UNITS CAPS, Take 2,000 Units by mouth 2 (two) times daily., Disp: , Rfl:    cinacalcet (SENSIPAR) 30 MG tablet, Take 1 tablet (30 mg total) by mouth daily with breakfast., Disp: 90 tablet, Rfl: 1   estradiol (ESTRACE VAGINAL) 0.1 MG/GM vaginal cream, Place 1 Applicatorful vaginally at bedtime., Disp: 42.5 g, Rfl: 12   glimepiride (AMARYL) 2 MG tablet, TAKE 1 TABLET(2 MG) BY MOUTH DAILY WITH BREAKFAST, Disp: 90 tablet, Rfl: 3   losartan (COZAAR) 50 MG tablet, TAKE 1 TABLET BY MOUTH EVERY DAY, Disp: 90 tablet, Rfl: 1   Multiple Vitamins-Minerals (CENTRUM SILVER 50+WOMEN PO), Take 1 tablet by mouth daily., Disp: , Rfl:    nystatin cream (MYCOSTATIN), Apply 1 application. topically 2 (two) times daily., Disp: 30 g, Rfl: 0   omeprazole (PRILOSEC) 20 MG  capsule, TAKE 1 CAPSULE(20 MG) BY MOUTH DAILY, Disp: 90 capsule, Rfl: 3   ONETOUCH VERIO test strip, TEST FOUR TIMES DAILY, Disp: 100 strip, Rfl: 3   phenazopyridine (PYRIDIUM) 200 MG tablet, Take 1 tablet (200 mg total) by mouth 3 (three) times daily as needed for pain., Disp: 30 tablet, Rfl: 0   rosuvastatin (CRESTOR) 10 MG tablet, TAKE 1 TABLET THREE TIMES WEEKLY MONDAY WEDNESDAY AND FRIDAY (Patient taking differently: Take 10 mg by mouth daily.), Disp: 36 tablet, Rfl: 3   Semaglutide,0.25 or 0.5MG /DOS, (OZEMPIC, 0.25 OR 0.5 MG/DOSE,) 2 MG/1.5ML SOPN, Inject 0.25 mg into the skin once a week., Disp: 1.5 mL, Rfl: 0   sodium chloride (MURO 128) 2 % ophthalmic solution, Place 1 drop into both eyes 2 (two) times daily., Disp: , Rfl:    Review of Systems:   ROS Negative unless otherwise specified per HPI.   Vitals:   Vitals:   04/18/22 1116  BP: 130/70  Pulse: 76  Temp: (!) 97.2 F (36.2 C)  TempSrc: Temporal  SpO2: 96%  Weight: 157 lb 4 oz (71.3 kg)  Height: 5' (1.524 m)     Body mass index is 30.71 kg/m.  Physical Exam:   Physical Exam Vitals and nursing note reviewed.  Constitutional:      General: She is not in acute distress.    Appearance: She is well-developed. She is not ill-appearing or toxic-appearing.  Cardiovascular:     Rate and Rhythm: Normal rate and regular rhythm.     Pulses: Normal pulses.     Heart sounds: Normal heart sounds, S1 normal and S2 normal.  Pulmonary:     Effort: Pulmonary effort is normal.     Breath sounds: Normal breath sounds.  Musculoskeletal:     Comments: No pain to LE No TTP/erythema/swelling appreciated  Skin:    General: Skin is warm and dry.  Neurological:     Mental Status: She is alert.     GCS: GCS eye subscore is 4. GCS verbal subscore is 5. GCS motor subscore is 6.  Psychiatric:        Speech: Speech normal.        Behavior: Behavior normal. Behavior is cooperative.     Assessment and Plan:   Left leg pain No red  flags -- she is completely asymptomatic Will proceed with u/s out of abundance of caution and for reassurance  Diabetes mellitus without complication ([place]) Doing well with Ozempic Her Ozempic was recently approved but this is $49/mo for her --- she is quickly approaching  the donut hole and would like a sample Sample of ozempic provided -- recommended that she continue 0.25 mg dosage at this time Follow-up with PCP as directed  I,Savera Zaman,acting as a scribe for [name], PA.,have documented all relevant documentation on the behalf of [name], PA,as directed by  [name], PA while in the presence of [name], [name].   I, [name], [name], have reviewed all documentation for this visit. The documentation on 04/18/22 for the exam, diagnosis, procedures, and orders are all accurate and complete.   [name], PA-C

## 2022-04-18 NOTE — Progress Notes (Signed)
Endocrinology following

## 2022-04-18 NOTE — Addendum Note (Signed)
Addended by: Marian Sorrow on: 04/18/2022 02:57 PM   Modules accepted: Orders

## 2022-04-19 ENCOUNTER — Telehealth: Payer: Self-pay | Admitting: Family Medicine

## 2022-04-19 DIAGNOSIS — E785 Hyperlipidemia, unspecified: Secondary | ICD-10-CM

## 2022-04-19 DIAGNOSIS — C50919 Malignant neoplasm of unspecified site of unspecified female breast: Secondary | ICD-10-CM

## 2022-04-19 MED ORDER — ROSUVASTATIN CALCIUM 10 MG PO TABS: 10.0000 mg | ORAL_TABLET | Freq: Every day | ORAL | 2 refills | Status: DC

## 2022-04-19 NOTE — Telephone Encounter (Signed)
..   Encourage patient to contact the pharmacy for refills or they can request refills through [place]:  04/18/22 with [name]  03/03/22 with Dr. Jerline Pain  NEXT APPOINTMENT DATE: 05/29/22  MEDICATION: rosuvastatin (CRESTOR) 10 MG tablet    Is the patient out of medication? Has about 2 weeks of medication left  PHARMACY: [facility] [place], [place] [address]  [address], [place] [address]  Phone:  [phone]  Fax:  [phone]  DEA #:  [id number]  Let patient know to contact pharmacy at the end of the day to make sure medication is ready.  Please notify patient to allow 48-72 hours to process   Although prescription is written to be taken 3 times weekly (Monday, Wednesday, and Friday), patient states she was notified by Dr. Jerline Pain to take this daily. She would like the directions to be changed to this frequency so the pharmacy will give her enough medication.

## 2022-04-19 NOTE — Telephone Encounter (Signed)
Spoke to pt asked her are you taking Crestor 10 mg daily or Mon, Wed, Friday? Pt said she is taking it daily, Dr. Jerline Pain changed it when she was seen last. Told her okay I will send Rx to the pharmacy. Pt verbalized understanding.

## 2022-05-03 ENCOUNTER — Telehealth: Payer: Self-pay | Admitting: Family Medicine

## 2022-05-03 NOTE — Telephone Encounter (Signed)
Patient stated she received her Tetanus shot on 04/29/22 at [facility] in [place]

## 2022-05-04 NOTE — Telephone Encounter (Signed)
Immunization updated  Print [facility] records and placed to be scan on patient chart

## 2022-05-18 ENCOUNTER — Other Ambulatory Visit: Payer: Self-pay | Admitting: "Endocrinology

## 2022-05-18 DIAGNOSIS — E212 Other hyperparathyroidism: Secondary | ICD-10-CM

## 2022-05-22 DIAGNOSIS — N200 Calculus of kidney: Secondary | ICD-10-CM | POA: Diagnosis not present

## 2022-05-22 DIAGNOSIS — R300 Dysuria: Secondary | ICD-10-CM | POA: Diagnosis not present

## 2022-05-29 ENCOUNTER — Encounter: Payer: Self-pay | Admitting: Family Medicine

## 2022-05-29 ENCOUNTER — Ambulatory Visit (INDEPENDENT_AMBULATORY_CARE_PROVIDER_SITE_OTHER): Payer: PPO | Admitting: Family Medicine

## 2022-05-29 VITALS — BP 133/69 | HR 76 | Temp 98.0°F | Ht 60.0 in | Wt 156.0 lb

## 2022-05-29 DIAGNOSIS — E119 Type 2 diabetes mellitus without complications: Secondary | ICD-10-CM

## 2022-05-29 DIAGNOSIS — E1159 Type 2 diabetes mellitus with other circulatory complications: Secondary | ICD-10-CM | POA: Diagnosis not present

## 2022-05-29 DIAGNOSIS — I10 Essential (primary) hypertension: Secondary | ICD-10-CM | POA: Diagnosis not present

## 2022-05-29 DIAGNOSIS — J310 Chronic rhinitis: Secondary | ICD-10-CM | POA: Diagnosis not present

## 2022-05-29 DIAGNOSIS — I152 Hypertension secondary to endocrine disorders: Secondary | ICD-10-CM

## 2022-05-29 LAB — POCT GLYCOSYLATED HEMOGLOBIN (HGB A1C): Hemoglobin A1C: 9 % — AB (ref 4.0–5.6)

## 2022-05-29 MED ORDER — OZEMPIC (1 MG/DOSE) 4 MG/3ML ~~LOC~~ SOPN
1.0000 mg | PEN_INJECTOR | SUBCUTANEOUS | 3 refills | Status: DC
Start: 2022-05-29 — End: 2022-08-16

## 2022-05-29 NOTE — Assessment & Plan Note (Signed)
A1c slightly elevated compared to last time to 9.0.  Her fasting sugars have been elevated as well.  We will increase her Ozempic to 1 mg weekly.  She has been on 0.5 mg weekly for the last couple of weeks.  We also discussed lifestyle modifications.  We can recheck A1c in 3 months and we can titrate the dose of Ozempic as needed.

## 2022-05-29 NOTE — Assessment & Plan Note (Addendum)
Still not controlled.  Recommended trying Allegra.  She can also follow-up with ENT to discuss next steps.

## 2022-05-29 NOTE — Patient Instructions (Addendum)
It was very nice to see you today!  Your A1c today is 9.0. Please increase your ozempic to '1mg'$  weekly.  Try taking the allegra to help with your allergies.   We will see you back in 3 months to recheck your A1c.  Come back sooner if needed.  Take care, Dr Jerline Pain  PLEASE NOTE:  If you had any lab tests please let us know if you have not heard back within a few days. You may see your results on mychart before we have a chance to review them but we will give you a call once they are reviewed by [name]. If we ordered any referrals today, please let us know if you have not heard from their office within the next week.   Please try these tips to maintain a healthy lifestyle:  Eat at least 3 REAL meals and 1-2 snacks per day.  Aim for no more than 5 hours between eating.  If you eat breakfast, please do so within one hour of getting up.   Each meal should contain half fruits/vegetables, one quarter protein, and one quarter carbs (no bigger than a computer mouse)  Cut down on sweet beverages. This includes juice, soda, and sweet tea.   Drink at least 1 glass of water with each meal and aim for at least 8 glasses per day  Exercise at least 150 minutes every week.

## 2022-05-29 NOTE — Progress Notes (Signed)
   [name] is a 87 y.o. female who presents today for an office visit.  Assessment/Plan:  Chronic Problems Addressed Today: Diabetes mellitus without complication (HCC) H0Q slightly elevated compared to last time to 9.0.  Her fasting sugars have been elevated as well.  We will increase her Ozempic to 1 mg weekly.  She has been on 0.5 mg weekly for the last couple of weeks.  We also discussed lifestyle modifications.  We can recheck A1c in 3 months and we can titrate the dose of Ozempic as needed.  Hypertension associated with diabetes ([place]) At goal today on losartan 50 mg daily.  Rhinitis Still not controlled.  Recommended trying Allegra.  She can also follow-up with ENT to discuss next steps.     Subjective:  HPI:  See A/P for status of chronic conditions.       Objective:  Physical Exam: BP 133/69   Pulse 76   Temp 98 F (36.7 C) (Temporal)   Ht 5' (1.524 m)   Wt 156 lb (70.8 kg)   SpO2 98%   BMI 30.47 kg/m   Wt Readings from Last 3 Encounters:  05/29/22 156 lb (70.8 kg)  04/18/22 157 lb 4 oz (71.3 kg)  04/04/22 158 lb 3.2 oz (71.8 kg)  Gen: No acute distress, resting comfortably Neuro: Grossly normal, moves all extremities Psych: Normal affect and thought content      Brittay Mogle M. Jerline Pain, MD 05/29/2022 10:08 AM

## 2022-05-29 NOTE — Assessment & Plan Note (Signed)
At goal today on losartan 50 mg daily. 

## 2022-06-22 ENCOUNTER — Telehealth: Payer: Self-pay | Admitting: Family Medicine

## 2022-06-22 NOTE — Telephone Encounter (Signed)
Patient states: - She went to pharmacy to pick up Januvia but found it was over $300 out of pocket   Patient requests: - Samples of the medication be provided if possible or an alternative medication that is more affordable   Please Advise.

## 2022-06-22 NOTE — Telephone Encounter (Signed)
Please advise 

## 2022-06-23 NOTE — Telephone Encounter (Signed)
Can we clarify? She should not be on januvia. She should be on ozempic '1mg'$  weekly.  Algis Greenhouse. Jerline Pain, MD 06/23/2022 12:48 PM

## 2022-06-26 NOTE — Telephone Encounter (Signed)
Patient returned call. Patient requests to be called at ph# [phone]

## 2022-06-26 NOTE — Telephone Encounter (Signed)
Left message to return call to our office at their convenience.  

## 2022-06-26 NOTE — Telephone Encounter (Signed)
Spoke with patient, stated did not pick up Pump Back form pharmacy due to elevated priced  Patient stated taking ozempic  Advise to continue taking ozempic as prescribed by PCP, Januvia not in current medication  Patient verbalized understanding

## 2022-06-28 DIAGNOSIS — S233XXA Sprain of ligaments of thoracic spine, initial encounter: Secondary | ICD-10-CM | POA: Diagnosis not present

## 2022-06-28 DIAGNOSIS — M9902 Segmental and somatic dysfunction of thoracic region: Secondary | ICD-10-CM | POA: Diagnosis not present

## 2022-06-28 DIAGNOSIS — M47816 Spondylosis without myelopathy or radiculopathy, lumbar region: Secondary | ICD-10-CM | POA: Diagnosis not present

## 2022-06-28 DIAGNOSIS — M436 Torticollis: Secondary | ICD-10-CM | POA: Diagnosis not present

## 2022-06-28 DIAGNOSIS — M9901 Segmental and somatic dysfunction of cervical region: Secondary | ICD-10-CM | POA: Diagnosis not present

## 2022-06-28 DIAGNOSIS — M9903 Segmental and somatic dysfunction of lumbar region: Secondary | ICD-10-CM | POA: Diagnosis not present

## 2022-06-28 DIAGNOSIS — M47812 Spondylosis without myelopathy or radiculopathy, cervical region: Secondary | ICD-10-CM | POA: Diagnosis not present

## 2022-07-08 ENCOUNTER — Other Ambulatory Visit: Payer: Self-pay | Admitting: *Deleted

## 2022-07-08 MED ORDER — GLIMEPIRIDE 2 MG PO TABS: ORAL_TABLET | ORAL | 3 refills | Status: DC

## 2022-07-10 ENCOUNTER — Ambulatory Visit (INDEPENDENT_AMBULATORY_CARE_PROVIDER_SITE_OTHER): Payer: HMO

## 2022-07-10 DIAGNOSIS — Z0000 Encounter for general adult medical examination without abnormal findings: Secondary | ICD-10-CM

## 2022-07-10 NOTE — Progress Notes (Signed)
Virtual Visit via Telephone Note  I connected with  [name] on 07/10/22 at  3:15 PM EDT by telephone and verified that I am speaking with the correct person using two identifiers.  Medicare Annual Wellness visit completed telephonically due to Covid-19 pandemic.   Persons participating in this call: This Health Coach and this patient.   Location: Patient: home Provider: office    I discussed the limitations, risks, security and privacy concerns of performing an evaluation and management service by telephone and the availability of in person appointments. The patient expressed understanding and agreed to proceed.  Unable to perform video visit due to video visit attempted and failed and/or patient does not have video capability.   Some vital signs may be absent or patient reported.   Willette Brace, LPN   Subjective:   [name] is a 87 y.o. female who presents for Medicare Annual (Subsequent) preventive examination.  Review of Systems     Cardiac Risk Factors include: advanced age (>53mn, >>30women);obesity (BMI >30kg/m2);dyslipidemia;hypertension;diabetes mellitus     Objective:    There were no vitals filed for this visit. There is no height or weight on file to calculate BMI.     06/13/2021    3:23 PM 06/07/2020    3:20 PM 05/07/2019   11:01 AM 02/17/2016    3:42 PM 08/12/2015    2:36 PM 02/11/2015   11:32 AM  Advanced Directives  Does Patient Have a Medical Advance Directive? Yes Yes Yes Yes Yes Yes  Type of Advance Directive Living will [place]  Does patient want to make changes to medical advance directive?   No - Patient declined No - Patient declined    Copy of HRomein Chart?    No - copy requested No - copy requested No - copy requested    Current Medications (verified) Outpatient Encounter Medications as of  07/10/2022  Medication Sig   azelastine (ASTELIN) 0.1 % nasal spray Place 2 sprays into both nostrils 2 (two) times daily.   blood glucose meter kit and supplies KIT Dispense based on patient and insurance preference. Use up to four times daily as directed. (FOR ICD-9 250.00, 250.01).   Cholecalciferol (VITAMIN D-3) 1000 UNITS CAPS Take 2,000 Units by mouth 2 (two) times daily.   cinacalcet (SENSIPAR) 30 MG tablet TAKE 1 TABLET(30 MG) BY MOUTH DAILY WITH BREAKFAST   glimepiride (AMARYL) 2 MG tablet TAKE 1 TABLET(2 MG) BY MOUTH DAILY WITH BREAKFAST   losartan (COZAAR) 50 MG tablet TAKE 1 TABLET BY MOUTH EVERY DAY   Multiple Vitamins-Minerals (CENTRUM SILVER 50+WOMEN PO) Take 1 tablet by mouth daily.   omeprazole (PRILOSEC) 20 MG capsule TAKE 1 CAPSULE(20 MG) BY MOUTH DAILY   ONETOUCH VERIO test strip TEST FOUR TIMES DAILY   rosuvastatin (CRESTOR) 10 MG tablet Take 1 tablet (10 mg total) by mouth daily.   Semaglutide, 1 MG/DOSE, (OZEMPIC, 1 MG/DOSE,) 4 MG/3ML SOPN Inject 1 mg into the skin once a week.   sodium chloride (MURO 128) 2 % ophthalmic solution Place 1 drop into both eyes 2 (two) times daily.   nystatin cream (MYCOSTATIN) Apply 1 application. topically 2 (two) times daily. (Patient not taking: Reported on 07/10/2022)   phenazopyridine (PYRIDIUM) 200 MG tablet Take 1 tablet (200 mg total) by mouth 3 (three) times daily as needed for pain. (Patient not taking: Reported on 07/10/2022)   [  DISCONTINUED] estradiol (ESTRACE VAGINAL) 0.1 MG/GM vaginal cream Place 1 Applicatorful vaginally at bedtime.   No facility-administered encounter medications on file as of 07/10/2022.    Allergies (verified) Patient has no known allergies.   History: Past Medical History:  Diagnosis Date   Arthritis    Cancer ([place])    Diabetes mellitus without complication (HCC)    GERD (gastroesophageal reflux disease)    Hyperlipidemia    Hypertension    Thyroid disease    Past Surgical History:  Procedure  Laterality Date   BREAST SURGERY     MELANOMA EXCISION     TONSILLECTOMY     Family History  Problem Relation Age of Onset   Cancer Mother    Hypertension Father    Social History   Socioeconomic History   Marital status: Married    Spouse name: Not on file   Number of children: Not on file   Years of education: Not on file   Highest education level: Not on file  Occupational History   Occupation: retired  Tobacco Use   Smoking status: Former    Packs/day: 1.00    Years: 15.00    Total pack years: 15.00    Types: Cigarettes    Start date: 09/14/1955    Quit date: 07/14/1969    Years since quitting: 53.0   Smokeless tobacco: Never   Tobacco comments:    quit 40 years ago  Substance and Sexual Activity   Alcohol use: Never    Alcohol/week: 0.0 standard drinks of alcohol   Drug use: Never   Sexual activity: Not Currently  Other Topics Concern   Not on file  Social History Narrative   Not on file   Social Determinants of Health   Financial Resource Strain: Low Risk  (07/10/2022)   Overall Financial Resource Strain (CARDIA)    Difficulty of Paying Living Expenses: Not hard at all  Food Insecurity: No Food Insecurity (07/10/2022)   Hunger Vital Sign    Worried About Running Out of Food in the Last Year: Never true    [employer] in the Last Year: Never true  Transportation Needs: No Transportation Needs (07/10/2022)   PRAPARE - Hydrologist (Medical): No    Lack of Transportation (Non-Medical): No  Physical Activity: Insufficiently Active (07/10/2022)   Exercise Vital Sign    Days of Exercise per Week: 5 days    Minutes of Exercise per Session: 10 min  Stress: No Stress Concern Present (07/10/2022)   [place]    Feeling of Stress : Not at all  Social Connections: [place] (07/10/2022)   Social Connection and Isolation Panel [NHANES]    Frequency of  Communication with Friends and Family: More than three times a week    Frequency of Social Gatherings with Friends and Family: More than three times a week    Attends Religious Services: More than 4 times per year    Active Member of [employer] or Organizations: Yes    Attends Archivist Meetings: 1 to 4 times per year    Marital Status: Married    Tobacco Counseling Counseling given: Not Answered Tobacco comments: quit 40 years ago   Clinical Intake:  Pre-visit preparation completed: Yes  Pain : No/denies pain     BMI - recorded: 30.47 Nutritional Status: BMI > 30  Obese Nutritional Risks: None Diabetes: Yes CBG done?: Yes (264) CBG resulted in  Enter/ Edit results?: No Did pt. bring in CBG monitor from home?: No  How often do you need to have someone help you when you read instructions, pamphlets, or other written materials from your doctor or pharmacy?: 1 - Never  Diabetic?Nutrition Risk Assessment:  Has the patient had any N/V/D within the last 2 months?  No  Does the patient have any non-healing wounds?  No  Has the patient had any unintentional weight loss or weight gain?  No   Diabetes:  Is the patient diabetic?  Yes  If diabetic, was a CBG obtained today?  Yes  Did the patient bring in their glucometer from home?  No  How often do you monitor your CBG's? Daily BID .   Financial Strains and Diabetes Management:  Are you having any financial strains with the device, your supplies or your medication? No .  Does the patient want to be seen by Chronic Care Management for management of their diabetes?  No  Would the patient like to be referred to a Nutritionist or for Diabetic Management?  No   Diabetic Exams:  Diabetic Eye Exam: Overdue for diabetic eye exam. Pt has been advised about the importance in completing this exam. Patient advised to call and schedule an eye exam. Stated she is scheduled for 08/22/22 Diabetic Foot Exam: Completed  06/14/21   Interpreter Needed?: No  Information entered by :: Charlott Rakes, LPN   Activities of Daily Living    07/10/2022    3:21 PM  In your present state of health, do you have any difficulty performing the following activities:  Hearing? 1  Comment wears hearing aids  Vision? 0  Difficulty concentrating or making decisions? 0  Walking or climbing stairs? 0  Dressing or bathing? 0  Doing errands, shopping? 0  Preparing Food and eating ? N  Using the Toilet? N  In the past six months, have you accidently leaked urine? N  Do you have problems with loss of bowel control? N  Managing your Medications? N  Managing your Finances? N  Housekeeping or managing your Housekeeping? N    Patient Care Team: [name], MD as PCP - General (Family Medicine) [facility] Madelin Rear, [facility] (Inactive) as Pharmacist (Pharmacist)  Indicate any recent Medical Services you may have received from other than [facility] providers in the past year (date may be approximate).     Assessment:   This is a routine wellness examination for [place].  Hearing/Vision screen Hearing Screening - Comments:: Pt wears hearing aids  Vision Screening - Comments:: Pt  follows up with Dr Katy Fitch for annual eye exams   Dietary issues and exercise activities discussed: Current Exercise Habits: Home exercise routine, Type of exercise: stretching, Time (Minutes): 10, Frequency (Times/Week): 5, Weekly Exercise (Minutes/Week): 50   Goals Addressed             This Visit's Progress    Patient Stated       Maintain health        Depression Screen    05/29/2022    9:35 AM 02/27/2022    8:36 AM 06/14/2021   11:24 AM 06/13/2021    3:21 PM 06/23/2020    3:53 PM 06/07/2020    3:17 PM 03/08/2020    9:17 AM  PHQ 2/9 Scores  PHQ - 2 Score 0 0 0 0 0 0 0    Fall Risk    07/10/2022    3:21 PM 05/29/2022  9:35 AM 02/27/2022    8:36 AM 12/12/2021   11:03 AM 09/13/2021   11:13 AM  Fall Risk    Falls in the past year? 0 0 0 0 0  Number falls in past yr: 0 0 0 0 0  Injury with Fall? 0 0 0 0 0  Risk for fall due to : Impaired vision No Fall Risks No Fall Risks    Follow up Falls prevention discussed        FALL RISK PREVENTION PERTAINING TO THE HOME:  Any stairs in or around the home? Yes  If so, are there any without handrails? No  Home free of loose throw rugs in walkways, pet beds, electrical cords, etc? Yes  Adequate lighting in your home to reduce risk of falls? Yes   ASSISTIVE DEVICES UTILIZED TO PREVENT FALLS:  Life alert? No  Use of a cane, walker or w/c? No  Grab bars in the bathroom? Yes  Shower chair or bench in shower? Yes  Elevated toilet seat or a handicapped toilet? No   TIMED UP AND GO:  Was the test performed? No .   Cognitive Function:        07/10/2022    3:22 PM 06/13/2021    3:27 PM  6CIT Screen  What Year? 0 points 0 points  What month? 0 points 0 points  What time? 0 points 0 points  Count back from 20 0 points 0 points  Months in reverse 0 points 0 points  Repeat phrase 0 points 0 points  Total Score 0 points 0 points    Immunizations Immunization History  Administered Date(s) Administered   Fluad Quad(high Dose 65+) 08/12/2019, 07/29/2020   Influenza Split 06/28/2010   Influenza,inj,Quad PF,6+ Mos 08/12/2015   Influenza-Unspecified 09/11/2016, 08/14/2017, 08/12/2018, 08/30/2021   PFIZER(Purple Top)SARS-COV-2 Vaccination 11/19/2019, 12/10/2019, 11/01/2021   Pfizer Covid-19 Vaccine Bivalent Booster 29yr & up 11/01/2021   Pneumococcal Conjugate-13 07/08/2014   Pneumococcal Polysaccharide-23 03/05/2014   Tdap 03/15/2011, 04/29/2022   Zoster Recombinat (Shingrix) 12/14/2021, 02/16/2022   Zoster, Live 01/14/2007    TDAP status: Up to date  Flu Vaccine status: Up to date  Pneumococcal vaccine status: Up to date  Covid-19 vaccine status: Completed vaccines  Qualifies for Shingles Vaccine? Yes   Zostavax completed Yes    Shingrix Completed?: Yes  Screening Tests Health Maintenance  Topic Date Due   INFLUENZA VACCINE  05/30/2022   FOOT EXAM  06/14/2022   OPHTHALMOLOGY EXAM  10/18/2022 (Originally 08/11/2021)   COVID-19 Vaccine (4 - Pfizer risk series) 04/19/2023 (Originally 12/27/2021)   HEMOGLOBIN A1C  11/29/2022   TETANUS/TDAP  04/29/2032   Pneumonia Vaccine 87 Years old  Completed   DEXA SCAN  Completed   Zoster Vaccines- Shingrix  Completed   HPV VACCINES  Aged Out    Health Maintenance  Health Maintenance Due  Topic Date Due   INFLUENZA VACCINE  05/30/2022   FOOT EXAM  06/14/2022    Colorectal cancer screening: No longer required.   Mammogram status: Completed 04/01/21. Repeat every year  Bone Density status: Completed 04/13/22. Results reflect: Bone density results: OSTEOPENIA. Repeat every 2 years.  Additional Screening:    Vision Screening: Recommended annual ophthalmology exams for early detection of glaucoma and other disorders of the eye. Is the patient up to date with their annual eye exam?  Yes  Who is the provider or what is the name of the office in which the patient attends annual eye exams? Dr GKaty Fitch If  pt is not established with a provider, would they like to be referred to a provider to establish care? No .   Dental Screening: Recommended annual dental exams for proper oral hygiene  Community Resource Referral / Chronic Care Management: CRR required this visit?  No   CCM required this visit?  No      Plan:     I have personally reviewed and noted the following in the patient's chart:   Medical and social history Use of alcohol, tobacco or illicit drugs  Current medications and supplements including opioid prescriptions. Patient is not currently taking opioid prescriptions. Functional ability and status Nutritional status Physical activity Advanced directives List of other physicians Hospitalizations, surgeries, and ER visits in previous 12  months Vitals Screenings to include cognitive, depression, and falls Referrals and appointments  In addition, I have reviewed and discussed with patient certain preventive protocols, quality metrics, and best practice recommendations. A written personalized care plan for preventive services as well as general preventive health recommendations were provided to patient.     Willette Brace, LPN   [id number]   Nurse Notes: none

## 2022-07-10 NOTE — Patient Instructions (Signed)
[name] , Thank you for taking time to come for your Medicare Wellness Visit. I appreciate your ongoing commitment to your health goals. Please review the following plan we discussed and let me know if I can assist you in the future.   Screening recommendations/referrals: Colonoscopy: no longer required  Mammogram: done 04/01/21 repeat every year  Bone Density: done 04/13/22 repeat every 2 years  Recommended yearly ophthalmology/optometry visit for glaucoma screening and checkup Recommended yearly dental visit for hygiene and checkup  Vaccinations: Influenza vaccine: done 08/30/21 repeat every year  Pneumococcal vaccine: Up to date Tdap vaccine: done 04/29/22 repeat every  10 years  Shingles vaccine: completed 2/15, 02/16/22   Covid-19:completed 1/20, 12/10/19 & 11/01/21  Advanced directives: Please bring a copy of your health care power of attorney and living will to the office at your convenience.  Conditions/risks identified: maintain health   Next appointment: Follow up in one year for your annual wellness visit    Preventive Care 65 Years and Older, Female Preventive care refers to lifestyle choices and visits with your health care provider that can promote health and wellness. What does preventive care include? A yearly physical exam. This is also called an annual well check. Dental exams once or twice a year. Routine eye exams. Ask your health care provider how often you should have your eyes checked. Personal lifestyle choices, including: Daily care of your teeth and gums. Regular physical activity. Eating a healthy diet. Avoiding tobacco and drug use. Limiting alcohol use. Practicing safe sex. Taking low-dose aspirin every day. Taking vitamin and mineral supplements as recommended by your health care provider. What happens during an annual well check? The services and screenings done by your health care provider during your annual well check will depend on your age,  overall health, lifestyle risk factors, and family history of disease. Counseling  Your health care provider may ask you questions about your: Alcohol use. Tobacco use. Drug use. Emotional well-being. Home and relationship well-being. Sexual activity. Eating habits. History of falls. Memory and ability to understand (cognition). Work and work Statistician. Reproductive health. Screening  You may have the following tests or measurements: Height, weight, and BMI. Blood pressure. Lipid and cholesterol levels. These may be checked every 5 years, or more frequently if you are over 51 years old. Skin check. Lung cancer screening. You may have this screening every year starting at age 25 if you have a 30-pack-year history of smoking and currently smoke or have quit within the past 15 years. Fecal occult blood test (FOBT) of the stool. You may have this test every year starting at age 28. Flexible sigmoidoscopy or colonoscopy. You may have a sigmoidoscopy every 5 years or a colonoscopy every 10 years starting at age 29. Hepatitis C blood test. Hepatitis B blood test. Sexually transmitted disease (STD) testing. Diabetes screening. This is done by checking your blood sugar (glucose) after you have not eaten for a while (fasting). You may have this done every 1-3 years. Bone density scan. This is done to screen for osteoporosis. You may have this done starting at age 42. Mammogram. This may be done every 1-2 years. Talk to your health care provider about how often you should have regular mammograms. Talk with your health care provider about your test results, treatment options, and if necessary, the need for more tests. Vaccines  Your health care provider may recommend certain vaccines, such as: Influenza vaccine. This is recommended every year. Tetanus, diphtheria, and acellular pertussis (Tdap,  Td) vaccine. You may need a Td booster every 10 years. Zoster vaccine. You may need this after age  61. Pneumococcal 13-valent conjugate (PCV13) vaccine. One dose is recommended after age 46. Pneumococcal polysaccharide (PPSV23) vaccine. One dose is recommended after age 5. Talk to your health care provider about which screenings and vaccines you need and how often you need them. This information is not intended to replace advice given to you by your health care provider. Make sure you discuss any questions you have with your health care provider. Document Released: 11/12/2015 Document Revised: 07/05/2016 Document Reviewed: 08/17/2015 Elsevier Interactive Patient Education  2017 [place] Prevention in the Home Falls can cause injuries. They can happen to people of all ages. There are many things you can do to make your home safe and to help prevent falls. What can I do on the outside of my home? Regularly fix the edges of walkways and driveways and fix any cracks. Remove anything that might make you trip as you walk through a door, such as a raised step or threshold. Trim any bushes or trees on the path to your home. Use bright outdoor lighting. Clear any walking paths of anything that might make someone trip, such as rocks or tools. Regularly check to see if handrails are loose or broken. Make sure that both sides of any steps have handrails. Any raised decks and porches should have guardrails on the edges. Have any leaves, snow, or ice cleared regularly. Use sand or salt on walking paths during winter. Clean up any spills in your garage right away. This includes oil or grease spills. What can I do in the bathroom? Use night lights. Install grab bars by the toilet and in the tub and shower. Do not use towel bars as grab bars. Use non-skid mats or decals in the tub or shower. If you need to sit down in the shower, use a plastic, non-slip stool. Keep the floor dry. Clean up any water that spills on the floor as soon as it happens. Remove soap buildup in the tub or shower  regularly. Attach bath mats securely with double-sided non-slip rug tape. Do not have throw rugs and other things on the floor that can make you trip. What can I do in the bedroom? Use night lights. Make sure that you have a light by your bed that is easy to reach. Do not use any sheets or blankets that are too big for your bed. They should not hang down onto the floor. Have a firm chair that has side arms. You can use this for support while you get dressed. Do not have throw rugs and other things on the floor that can make you trip. What can I do in the kitchen? Clean up any spills right away. Avoid walking on wet floors. Keep items that you use a lot in easy-to-reach places. If you need to reach something above you, use a strong step stool that has a grab bar. Keep electrical cords out of the way. Do not use floor polish or wax that makes floors slippery. If you must use wax, use non-skid floor wax. Do not have throw rugs and other things on the floor that can make you trip. What can I do with my stairs? Do not leave any items on the stairs. Make sure that there are handrails on both sides of the stairs and use them. Fix handrails that are broken or loose. Make sure that handrails are as  long as the stairways. Check any carpeting to make sure that it is firmly attached to the stairs. Fix any carpet that is loose or worn. Avoid having throw rugs at the top or bottom of the stairs. If you do have throw rugs, attach them to the floor with carpet tape. Make sure that you have a light switch at the top of the stairs and the bottom of the stairs. If you do not have them, ask someone to add them for you. What else can I do to help prevent falls? Wear shoes that: Do not have high heels. Have rubber bottoms. Are comfortable and fit you well. Are closed at the toe. Do not wear sandals. If you use a stepladder: Make sure that it is fully opened. Do not climb a closed stepladder. Make sure that  both sides of the stepladder are locked into place. Ask someone to hold it for you, if possible. Clearly mark and make sure that you can see: Any grab bars or handrails. First and last steps. Where the edge of each step is. Use tools that help you move around (mobility aids) if they are needed. These include: Canes. Walkers. Scooters. Crutches. Turn on the lights when you go into a dark area. Replace any light bulbs as soon as they burn out. Set up your furniture so you have a clear path. Avoid moving your furniture around. If any of your floors are uneven, fix them. If there are any pets around you, be aware of where they are. Review your medicines with your doctor. Some medicines can make you feel dizzy. This can increase your chance of falling. Ask your doctor what other things that you can do to help prevent falls. This information is not intended to replace advice given to you by your health care provider. Make sure you discuss any questions you have with your health care provider. Document Released: 08/12/2009 Document Revised: 03/23/2016 Document Reviewed: 11/20/2014 Elsevier Interactive Patient Education  2017 [employer].

## 2022-07-18 ENCOUNTER — Other Ambulatory Visit: Payer: Self-pay | Admitting: *Deleted

## 2022-07-18 MED ORDER — OMEPRAZOLE 20 MG PO CPDR: DELAYED_RELEASE_CAPSULE | ORAL | 3 refills | Status: DC

## 2022-07-24 ENCOUNTER — Encounter: Payer: Self-pay | Admitting: *Deleted

## 2022-07-31 ENCOUNTER — Encounter: Payer: Self-pay | Admitting: Family Medicine

## 2022-08-05 ENCOUNTER — Other Ambulatory Visit: Payer: Self-pay | Admitting: Family Medicine

## 2022-08-05 DIAGNOSIS — E785 Hyperlipidemia, unspecified: Secondary | ICD-10-CM

## 2022-08-05 DIAGNOSIS — C50919 Malignant neoplasm of unspecified site of unspecified female breast: Secondary | ICD-10-CM

## 2022-08-16 ENCOUNTER — Telehealth: Payer: Self-pay | Admitting: Family Medicine

## 2022-08-16 ENCOUNTER — Other Ambulatory Visit: Payer: Self-pay | Admitting: *Deleted

## 2022-08-16 MED ORDER — ONETOUCH VERIO VI STRP: ORAL_STRIP | 3 refills | Status: DC

## 2022-08-16 MED ORDER — OZEMPIC (1 MG/DOSE) 4 MG/3ML ~~LOC~~ SOPN: 1.0000 mg | PEN_INJECTOR | SUBCUTANEOUS | 3 refills | Status: DC

## 2022-08-16 NOTE — Telephone Encounter (Signed)
LAST APPOINTMENT DATE:  05/29/22  NEXT APPOINTMENT DATE: 08/29/22  MEDICATION:  ONETOUCH VERIO test strip  Semaglutide, 1 MG/DOSE, (OZEMPIC, 1 MG/DOSE,) 4 MG/3ML SOPN   Is the patient out of medication? Took last dose of ozempic today  PHARMACY: [facility] [phone] - [place], [place] [address], [place] [address] Phone: [phone]  Fax: [phone]   Patient states: -Husband recently passed and she had to move. She just noticed test strips are in storage unit and unreachable for her at the moment

## 2022-08-16 NOTE — Telephone Encounter (Signed)
Rx send to pharmacy  Patient notified

## 2022-08-22 LAB — HM DIABETES EYE EXAM

## 2022-08-29 ENCOUNTER — Ambulatory Visit (INDEPENDENT_AMBULATORY_CARE_PROVIDER_SITE_OTHER): Payer: HMO | Admitting: Family Medicine

## 2022-08-29 ENCOUNTER — Encounter: Payer: Self-pay | Admitting: Family Medicine

## 2022-08-29 ENCOUNTER — Ambulatory Visit: Payer: PPO | Admitting: Family Medicine

## 2022-08-29 VITALS — BP 123/76 | HR 81 | Temp 98.0°F | Ht 60.0 in | Wt 149.6 lb

## 2022-08-29 DIAGNOSIS — E1159 Type 2 diabetes mellitus with other circulatory complications: Secondary | ICD-10-CM

## 2022-08-29 DIAGNOSIS — M1990 Unspecified osteoarthritis, unspecified site: Secondary | ICD-10-CM | POA: Insufficient documentation

## 2022-08-29 DIAGNOSIS — E119 Type 2 diabetes mellitus without complications: Secondary | ICD-10-CM

## 2022-08-29 DIAGNOSIS — I152 Hypertension secondary to endocrine disorders: Secondary | ICD-10-CM

## 2022-08-29 LAB — POCT GLYCOSYLATED HEMOGLOBIN (HGB A1C): Hemoglobin A1C: 8.1 % — AB (ref 4.0–5.6)

## 2022-08-29 NOTE — Assessment & Plan Note (Signed)
Has quite a bit of stiffness in her hands bilaterally.  She will try using Voltaren gel.  If not improving would consider referral to PT or sports medicine.

## 2022-08-29 NOTE — Patient Instructions (Signed)
It was very nice to see you today!  We will give your flu shot today.  Your A1c today is 8.1.  No medication changes today.  Please come back in 3 months.  Come back sooner if needed.  Take care, Dr Jerline Pain  PLEASE NOTE:  If you had any lab tests please let us know if you have not heard back within a few days. You may see your results on mychart before we have a chance to review them but we will give you a call once they are reviewed by [name]. If we ordered any referrals today, please let us know if you have not heard from their office within the next week.   Please try these tips to maintain a healthy lifestyle:  Eat at least 3 REAL meals and 1-2 snacks per day.  Aim for no more than 5 hours between eating.  If you eat breakfast, please do so within one hour of getting up.   Each meal should contain half fruits/vegetables, one quarter protein, and one quarter carbs (no bigger than a computer mouse)  Cut down on sweet beverages. This includes juice, soda, and sweet tea.   Drink at least 1 glass of water with each meal and aim for at least 8 glasses per day  Exercise at least 150 minutes every week.

## 2022-08-29 NOTE — Assessment & Plan Note (Signed)
At goal today on losartan 50 mg daily.

## 2022-08-29 NOTE — Progress Notes (Signed)
   [name] is a 87 y.o. female who presents today for an office visit.  Assessment/Plan:  New/Acute Problems: Grief  Patient feels like she is managing as well as can be expected.  We did discuss referral however she declined for now.  She will let us know if we can be of any further assistance.  Chronic Problems Addressed Today: Diabetes mellitus without complication (HCC) Y2Q improved slightly to 8.1. She is currently on ozempic '1mg'$  weekly and amaryl '2mg'$  daily. Will continue current regimen. Recheck in 3-6 months.   Osteoarthritis Has quite a bit of stiffness in her hands bilaterally.  She will try using Voltaren gel.  If not improving would consider referral to PT or sports medicine.  Hypertension associated with diabetes ([place]) At goal today on losartan 50 mg daily.  Flu shot given today.     Subjective:  HPI:  See A/p for status chronic conditions.    No new concerns for today.  Her husband passed away about 4 weeks ago.  This has been difficult for her but she feels like she is managing well.  She does have good support structure at home.  She is planning on moving in with her daughter.       Objective:  Physical Exam: BP 123/76   Pulse 81   Temp 98 F (36.7 C) (Temporal)   Ht 5' (1.524 m)   Wt 149 lb 9.6 oz (67.9 kg)   SpO2 97%   BMI 29.22 kg/m   Gen: No acute distress, resting comfortably CV: Regular rate and rhythm with no murmurs appreciated Pulm: Normal work of breathing, clear to auscultation bilaterally with no crackles, wheezes, or rhonchi Neuro: Grossly normal, moves all extremities Psych: Normal affect and thought content      [name] M. Jerline Pain, MD 08/29/2022 12:07 PM

## 2022-08-29 NOTE — Assessment & Plan Note (Addendum)
A1c improved slightly to 8.1. She is currently on ozempic '1mg'$  weekly and amaryl '2mg'$  daily. Will continue current regimen. Recheck in 3-6 months.

## 2022-09-27 LAB — PTH, INTACT AND CALCIUM
Calcium: 10.6 mg/dL — ABNORMAL HIGH (ref 8.7–10.3)
PTH: 39 pg/mL (ref 15–65)

## 2022-10-05 ENCOUNTER — Ambulatory Visit: Payer: HMO | Admitting: "Endocrinology

## 2022-10-12 ENCOUNTER — Encounter: Payer: Self-pay | Admitting: *Deleted

## 2022-10-13 ENCOUNTER — Ambulatory Visit: Payer: HMO | Admitting: "Endocrinology

## 2022-10-26 ENCOUNTER — Other Ambulatory Visit: Payer: Self-pay | Admitting: Family Medicine

## 2022-11-10 ENCOUNTER — Ambulatory Visit: Payer: HMO | Admitting: "Endocrinology

## 2022-11-13 ENCOUNTER — Other Ambulatory Visit: Payer: Self-pay | Admitting: Family Medicine

## 2022-11-13 ENCOUNTER — Ambulatory Visit: Payer: HMO | Admitting: "Endocrinology

## 2022-11-13 DIAGNOSIS — C50919 Malignant neoplasm of unspecified site of unspecified female breast: Secondary | ICD-10-CM

## 2022-11-13 DIAGNOSIS — E785 Hyperlipidemia, unspecified: Secondary | ICD-10-CM

## 2022-11-14 ENCOUNTER — Telehealth: Payer: Self-pay | Admitting: Family Medicine

## 2022-11-14 NOTE — Telephone Encounter (Signed)
Patient states her ozempic is on back order at [facility]. Pt requests  next step recommendations from PCP. Please Advise.

## 2022-11-15 NOTE — Telephone Encounter (Signed)
PATIENTS PHARAMCY WILL PICK UP MEDS TOMORROW 1/18 AT [facility] [place]- NO FURTHER ACTION NEEDED

## 2022-11-30 ENCOUNTER — Encounter: Payer: Self-pay | Admitting: "Endocrinology

## 2022-11-30 ENCOUNTER — Ambulatory Visit: Payer: HMO | Admitting: Family Medicine

## 2022-11-30 ENCOUNTER — Ambulatory Visit (INDEPENDENT_AMBULATORY_CARE_PROVIDER_SITE_OTHER): Payer: PPO | Admitting: "Endocrinology

## 2022-11-30 VITALS — BP 122/68 | HR 85 | Ht 60.0 in | Wt 143.0 lb

## 2022-11-30 DIAGNOSIS — E212 Other hyperparathyroidism: Secondary | ICD-10-CM | POA: Diagnosis not present

## 2022-11-30 MED ORDER — CINACALCET HCL 30 MG PO TABS: 30.0000 mg | ORAL_TABLET | Freq: Every day | ORAL | 1 refills | Status: DC

## 2022-11-30 NOTE — Progress Notes (Signed)
11/30/2022, 2:55 PM       Endocrinology follow-up note   [name] is a 88 y.o.-year-old female, here for follow-up of hypercalcemia related to hyperparathyroidism. PMD: her  [name], MD    Past Medical History:  Diagnosis Date   Arthritis    Cancer [facility])    Diabetes mellitus without complication ([place])    GERD (gastroesophageal reflux disease)    Hyperlipidemia    Hypertension    Thyroid disease     Past Surgical History:  Procedure Laterality Date   BREAST SURGERY     MELANOMA EXCISION     TONSILLECTOMY      Social History   Tobacco Use   Smoking status: Former    Packs/day: 1.00    Years: 15.00    Total pack years: 15.00    Types: Cigarettes    Start date: 09/14/1955    Quit date: 07/14/1969    Years since quitting: 53.4   Smokeless tobacco: Never   Tobacco comments:    quit 40 years ago  Substance Use Topics   Alcohol use: Never    Alcohol/week: 0.0 standard drinks of alcohol   Drug use: Never    Family History  Problem Relation Age of Onset   Cancer Mother    Hypertension Father     Outpatient Encounter Medications as of 11/30/2022  Medication Sig   Cholecalciferol (VITAMIN D-3) 1000 UNITS CAPS Take 2,000 Units by mouth 2 (two) times daily.   cinacalcet (SENSIPAR) 30 MG tablet Take 1 tablet (30 mg total) by mouth daily with breakfast.   glimepiride (AMARYL) 2 MG tablet TAKE 1 TABLET(2 MG) BY MOUTH DAILY WITH BREAKFAST   losartan (COZAAR) 50 MG tablet TAKE 1 TABLET BY MOUTH EVERY DAY   Multiple Vitamins-Minerals (CENTRUM SILVER 50+WOMEN PO) Take 1 tablet by mouth daily.   omeprazole (PRILOSEC) 20 MG capsule TAKE 1 CAPSULE(20 MG) BY MOUTH DAILY   rosuvastatin (CRESTOR) 10 MG tablet TAKE 1 TABLET(10 MG) BY MOUTH DAILY   Semaglutide, 1 MG/DOSE, (OZEMPIC, 1 MG/DOSE,) 4 MG/3ML SOPN Inject 1 mg into the skin once a week.   sodium chloride (MURO 128) 2 % ophthalmic solution Place  1 drop into both eyes 2 (two) times daily.   [DISCONTINUED] cinacalcet (SENSIPAR) 30 MG tablet TAKE 1 TABLET(30 MG) BY MOUTH DAILY WITH BREAKFAST   azelastine (ASTELIN) 0.1 % nasal spray Place 2 sprays into both nostrils 2 (two) times daily. (Patient not taking: Reported on 11/30/2022)   blood glucose meter kit and supplies KIT Dispense based on patient and insurance preference. Use up to four times daily as directed. (FOR ICD-9 250.00, 250.01).   glucose blood (ONETOUCH VERIO) test strip TEST FOUR TIMES DAILY   nystatin cream (MYCOSTATIN) Apply 1 application. topically 2 (two) times daily. (Patient not taking: Reported on 11/30/2022)   phenazopyridine (PYRIDIUM) 200 MG tablet Take 1 tablet (200 mg total) by mouth 3 (three) times daily as needed for pain. (Patient not taking: Reported on 11/30/2022)   No facility-administered encounter medications on file as of 11/30/2022.    No Known Allergies   HPI  Brenda  J [name] was diagnosed with hypercalcemia in May 2020.   Prior to her last visit, her work-up to help determine the hypercalcemia to be secondary to early mild primary hyperparathyroidism.   -Since she is not a surgical candidate, she was started on Sensipar,  currently on 30 mg p.o. every other day with breakfast.  She continues to tolerate this medication.  Her previsit labs show still above target calcium at 10.6 along with PTH os 39.  She has no new complaints today. She has diabetes controlled with A1c of 8.1%, follow-up with her PMD.   She also has hyperlipidemia uncontrolled on Crestor 10 mg.  She is known to have recurrent nephrolithiasis.  No history of CKD.   she is not on HCTZ or other thiazide therapy.  She is on vitamin D supplement-4000 units daily.  She is not on calcium supplements. she eats dairy and green, leafy, vegetables on average amounts.  she does not have a family history of hypercalcemia, pituitary tumors, thyroid cancer, or osteoporosis.  -On history, she lost 2  inches of height over the years. She has not done her bone density before this visit.   ROS: Limited as above.  PE: BP 122/68   Pulse 85   Ht 5' (1.524 m)   Wt 143 lb (64.9 kg)   BMI 27.93 kg/m , Body mass index is 27.93 kg/m. Wt Readings from Last 3 Encounters:  11/30/22 143 lb (64.9 kg)  08/29/22 149 lb 9.6 oz (67.9 kg)  05/29/22 156 lb (70.8 kg)       CMP     Component Value Date/Time   NA 137 03/18/2021 1039   NA 140 03/12/2018 0000   NA 145 02/15/2017 1333   NA 144 08/12/2015 1419   K 4.8 03/18/2021 1039   K 4.4 02/15/2017 1333   K 4.2 08/12/2015 1419   CL 102 03/18/2021 1039   CL 104 02/15/2017 1333   CO2 25 03/18/2021 1039   CO2 28 02/15/2017 1333   CO2 29 08/12/2015 1419   GLUCOSE 281 (H) 03/18/2021 1039   GLUCOSE 142 (H) 02/15/2017 1333   BUN 12 03/18/2021 1039   BUN 11 03/12/2018 0000   BUN 13 02/15/2017 1333   BUN 11.8 08/12/2015 1419   CREATININE 0.94 03/18/2021 1039   CREATININE 0.82 03/24/2020 1122   CREATININE 0.8 08/12/2015 1419   CALCIUM 10.6 (H) 09/26/2022 1302   CALCIUM 10.2 02/15/2017 1333   CALCIUM 11.1 (H) 08/12/2015 1419   PROT 7.7 03/18/2021 1039   PROT 7.5 02/15/2017 1333   PROT 7.2 08/12/2015 1419   ALBUMIN 4.4 03/18/2021 1039   ALBUMIN 150 MG/L 07/15/2019 1130   ALBUMIN 3.9 08/12/2015 1419   AST 20 03/18/2021 1039   AST 32 02/15/2017 1333   AST 26 08/12/2015 1419   ALT 24 03/18/2021 1039   ALT 39 02/15/2017 1333   ALT 32 08/12/2015 1419   ALKPHOS 89 03/18/2021 1039   ALKPHOS 76 02/15/2017 1333   ALKPHOS 74 08/12/2015 1419   BILITOT 0.7 03/18/2021 1039   BILITOT 0.90 02/15/2017 1333   BILITOT 0.52 08/12/2015 1419   GFRNONAA 67 03/24/2020 1122   GFRAA 77 03/24/2020 1122     Diabetic Labs (most recent): Lab Results  Component Value Date   HGBA1C 8.1 (A) 08/29/2022   HGBA1C 9.0 (A) 05/29/2022   HGBA1C 8.7 (A) 02/27/2022   MICROALBUR 292 12/10/2017   MICROALBUR 12 12/12/2016     Lipid Panel ( most recent) Lipid  Panel  Component Value Date/Time   CHOL 159 03/18/2021 1039   TRIG 368.0 (H) 03/18/2021 1039   HDL 30.10 (L) 03/18/2021 1039   CHOLHDL 5 03/18/2021 1039   VLDL 73.6 (H) 03/18/2021 1039   LDLCALC 86 11/06/2019 0836   LDLDIRECT 91.0 03/18/2021 1039      Lab Results  Component Value Date   TSH 6.29 (H) 04/18/2021   TSH 5.27 (H) 03/18/2021   TSH 3.96 11/21/2019   TSH 4.00 03/17/2019   TSH 4.36 03/12/2018   TSH 2.93 03/12/2017   FREET4 0.58 (L) 04/18/2021      Assessment: 1. Hypercalcemia / Hyperparathyroidism  Plan: See notes from prior visits.  She returns with stable calcium at 10.6 along with PTH of 39.  Overall these numbers are improving from 11.4/78 respectively.    Patient is not a surgical  candidate.  She is advised to increase her  Sensipar 30 mg p.o. every day with breakfast. - Patient also  has vitamin D deficiency on supplement-advised to continue vitamin D3 4000 units daily.  -PTH RP is normal, malignancy related hypercalcemia unlikely. -There seems to be a complication from hypercalcemia/hypocalcemia with 3 mm nonobstructive right sided nephrolithiasis.  no history of   osteoporosis,fragility fractures. No abdominal pain, no major mood disorders, no bone pain-she is encouraged to obtain her bone density.  Her most recent DXA showed osteopenia, will not need bisphosphonates.  -She is not a surgical candidate for hyperparathyroidism and wishes to avoid surgery.     She has uncontrolled type 2 diabetes, better a1c of 8.1% . she is addressing with her PMD.  She is advised to maintain close follow-up with her PCP.   I spent  23  minutes in the care of the patient today including review of labs from Thyroid Function, CMP, and other relevant labs ; imaging/biopsy records (current and previous including abstractions from other facilities); face-to-face time discussing  her lab results and symptoms, medications doses, her options of short and long term treatment  based on the latest standards of care / guidelines;   and documenting the encounter.  Rubie Maid Medlen  participated in the discussions, expressed understanding, and voiced agreement with the above plans.  All questions were answered to her satisfaction. she is encouraged to contact clinic should she have any questions or concerns prior to her return visit.    - Return in about 6 months (around 05/31/2023) for F/U with Pre-visit Labs.   Glade Lloyd, MD [facility] [address] [place], [place] [address] Phone: [phone]  Fax: [phone]    This note was partially dictated with voice recognition software. Similar sounding words can be transcribed inadequately or may not  be corrected upon review.  11/30/2022, 2:55 PM

## 2022-12-01 ENCOUNTER — Ambulatory Visit (INDEPENDENT_AMBULATORY_CARE_PROVIDER_SITE_OTHER): Payer: HMO | Admitting: Family Medicine

## 2022-12-01 ENCOUNTER — Encounter: Payer: Self-pay | Admitting: Family Medicine

## 2022-12-01 VITALS — BP 121/77 | HR 85 | Temp 97.5°F | Ht 60.0 in | Wt 143.4 lb

## 2022-12-01 DIAGNOSIS — R300 Dysuria: Secondary | ICD-10-CM | POA: Diagnosis not present

## 2022-12-01 DIAGNOSIS — E119 Type 2 diabetes mellitus without complications: Secondary | ICD-10-CM | POA: Diagnosis not present

## 2022-12-01 DIAGNOSIS — I152 Hypertension secondary to endocrine disorders: Secondary | ICD-10-CM | POA: Diagnosis not present

## 2022-12-01 DIAGNOSIS — E1159 Type 2 diabetes mellitus with other circulatory complications: Secondary | ICD-10-CM | POA: Diagnosis not present

## 2022-12-01 LAB — URINALYSIS, ROUTINE W REFLEX MICROSCOPIC
Bilirubin Urine: NEGATIVE
Ketones, ur: NEGATIVE
Nitrite: NEGATIVE
Specific Gravity, Urine: 1.015 (ref 1.000–1.030)
Total Protein, Urine: 30 — AB
Urine Glucose: 100 — AB
Urobilinogen, UA: 0.2 (ref 0.0–1.0)
pH: 6.5 (ref 5.0–8.0)

## 2022-12-01 LAB — POCT GLYCOSYLATED HEMOGLOBIN (HGB A1C): Hemoglobin A1C: 7.3 % — AB (ref 4.0–5.6)

## 2022-12-01 NOTE — Patient Instructions (Signed)
It was very nice to see you today!  Your sugar looks good today.  Will continue with your current medications.  Will flush your ears today.  We will check a urine sample today.  I will see back in 3 months.  Come back to see [name] sooner if needed.  Take care, Dr Jerline Pain  PLEASE NOTE:  If you had any lab tests, please let us know if you have not heard back within a few days. You may see your results on mychart before we have a chance to review them but we will give you a call once they are reviewed by [name].   If we ordered any referrals today, please let us know if you have not heard from their office within the next week.   If you had any urgent prescriptions sent in today, please check with the pharmacy within an hour of our visit to make sure the prescription was transmitted appropriately.   Please try these tips to maintain a healthy lifestyle:  Eat at least 3 REAL meals and 1-2 snacks per day.  Aim for no more than 5 hours between eating.  If you eat breakfast, please do so within one hour of getting up.   Each meal should contain half fruits/vegetables, one quarter protein, and one quarter carbs (no bigger than a computer mouse)  Cut down on sweet beverages. This includes juice, soda, and sweet tea.   Drink at least 1 glass of water with each meal and aim for at least 8 glasses per day  Exercise at least 150 minutes every week.

## 2022-12-01 NOTE — Assessment & Plan Note (Signed)
Blood pressure at home losartan 50 mg daily.

## 2022-12-01 NOTE — Progress Notes (Signed)
   [name] is a 88 y.o. female who presents today for an office visit.  Assessment/Plan:  New/Acute Problems: Dysuria No red flags. No signs of systemic illness.  Check UA and urine culture.  Cerumen Impaction  Successfully irrigated by RMA today. Can use OTc debrox or hydrogen peroxide if needed.   Chronic Problems Addressed Today: Diabetes mellitus without complication (HCC) N6E improved 7.3.  Continue Ozempic 1 mg weekly and Amaryl 2 mg daily.  Recheck in 3 months.  Hypertension associated with diabetes ([place]) Blood pressure at home losartan 50 mg daily.     Subjective:  HPI:  See A/p for status of chronic conditions.  She has had some mild dysuria for a few weeks. She would like to have her urine checked today.    She has been compliant with her medications. She has been doing well. Sugars are usually in the upper 100s and sometimes into the low 200s.  She is worried about earwax buildup. Has noticed decreased hearing despite hearing aids. She has had earwax blockages in the past.    She is still going through the grief process with losing her husband a few months ago.  Overall feels like she is sending this well.  Does have good support structure in place.      Objective:  Physical Exam: BP 121/77   Pulse 85   Temp (!) 97.5 F (36.4 C) (Temporal)   Ht 5' (1.524 m)   Wt 143 lb 6.4 oz (65 kg)   SpO2 98%   BMI 28.01 kg/m   Wt Readings from Last 3 Encounters:  12/01/22 143 lb 6.4 oz (65 kg)  11/30/22 143 lb (64.9 kg)  08/29/22 149 lb 9.6 oz (67.9 kg)  Gen: No acute distress, resting comfortably HEENT: Bilateral EAC with cerumen.  CV: Regular rate and rhythm with no murmurs appreciated Pulm: Normal work of breathing, clear to auscultation bilaterally with no crackles, wheezes, or rhonchi Neuro: Grossly normal, moves all extremities Psych: Normal affect and thought content      [name] M. Jerline Pain, MD 12/01/2022 10:36 AM

## 2022-12-01 NOTE — Assessment & Plan Note (Signed)
A1c improved 7.3.  Continue Ozempic 1 mg weekly and Amaryl 2 mg daily.  Recheck in 3 months.

## 2022-12-02 LAB — URINE CULTURE
MICRO NUMBER:: 14511722
SPECIMEN QUALITY:: ADEQUATE

## 2022-12-04 ENCOUNTER — Other Ambulatory Visit: Payer: Self-pay | Admitting: *Deleted

## 2022-12-04 ENCOUNTER — Telehealth: Payer: Self-pay | Admitting: Family Medicine

## 2022-12-04 NOTE — Telephone Encounter (Signed)
Patient states: - She was calling to inform Junious Dresser where she wanted a urology referral to  - She would like referral to go to Dr. Alona Bene.   Name: [facility] Address: [address] [place], [place] [address] Phone: [phone] Fax: [phone]

## 2022-12-04 NOTE — Progress Notes (Signed)
Please inform patient of the following:  Urine culture is negative.  No signs of UTI.  She did have some blood in her urine that was also present on the last couple of urinalysis that we performed.  Do not think she needs any antibiotics at this time however recommend she follow-up with urology soon.  Please place referral if needed.

## 2022-12-05 ENCOUNTER — Other Ambulatory Visit: Payer: Self-pay

## 2022-12-05 DIAGNOSIS — N390 Urinary tract infection, site not specified: Secondary | ICD-10-CM

## 2022-12-05 DIAGNOSIS — R300 Dysuria: Secondary | ICD-10-CM

## 2022-12-05 NOTE — Telephone Encounter (Signed)
Referral has been placed with patient preference

## 2022-12-06 DIAGNOSIS — C50912 Malignant neoplasm of unspecified site of left female breast: Secondary | ICD-10-CM | POA: Diagnosis not present

## 2022-12-07 ENCOUNTER — Other Ambulatory Visit: Payer: Self-pay

## 2022-12-07 ENCOUNTER — Other Ambulatory Visit: Payer: Self-pay | Admitting: Family Medicine

## 2022-12-07 MED ORDER — CINACALCET HCL 30 MG PO TABS: 30.0000 mg | ORAL_TABLET | Freq: Every day | ORAL | 1 refills | Status: DC

## 2022-12-07 NOTE — Telephone Encounter (Signed)
  Encourage patient to contact the pharmacy for refills or they can request refills through [place]:  Please schedule appointment if longer than 1 year  NEXT APPOINTMENT DATE:  MEDICATION:  Semaglutide, 1 MG/DOSE, (OZEMPIC, 1 MG/DOSE,) 4 MG/3ML SOPN   Is the patient out of medication? YES  PHARMACY:  [facility] [place] [address] [facility] [address] Phone: [phone]  Fax: [phone]      Let patient know to contact pharmacy at the end of the day to make sure medication is ready.  Please notify patient to allow 48-72 hours to process

## 2022-12-11 DIAGNOSIS — C50912 Malignant neoplasm of unspecified site of left female breast: Secondary | ICD-10-CM | POA: Diagnosis not present

## 2023-02-02 ENCOUNTER — Encounter: Payer: Self-pay | Admitting: Physician Assistant

## 2023-02-02 ENCOUNTER — Ambulatory Visit (INDEPENDENT_AMBULATORY_CARE_PROVIDER_SITE_OTHER): Payer: HMO | Admitting: Physician Assistant

## 2023-02-02 VITALS — BP 136/70 | HR 73 | Temp 97.8°F | Ht 60.0 in | Wt 143.5 lb

## 2023-02-02 DIAGNOSIS — R300 Dysuria: Secondary | ICD-10-CM | POA: Diagnosis not present

## 2023-02-02 LAB — POCT URINALYSIS DIPSTICK
Bilirubin, UA: NEGATIVE
Blood, UA: 3
Glucose, UA: NEGATIVE
Ketones, UA: NEGATIVE
Nitrite, UA: NEGATIVE
Protein, UA: POSITIVE — AB
Spec Grav, UA: 1.01 (ref 1.010–1.025)
Urobilinogen, UA: 0.2 E.U./dL
pH, UA: 7 (ref 5.0–8.0)

## 2023-02-02 MED ORDER — CEPHALEXIN 500 MG PO CAPS: 500.0000 mg | ORAL_CAPSULE | Freq: Two times a day (BID) | ORAL | 0 refills | Status: DC

## 2023-02-02 NOTE — Progress Notes (Signed)
[name] is a 88 y.o. female here for a new problem.  History of Present Illness:   Chief Complaint  Patient presents with   Dysuria    Pt c/o burning with urination and urgency started 3 days ago, low back pain. Denies fever or chills.    HPI  Dysuria Patient is complaining of urinary incontinence, urgency, and burning with urination starting 3 days ago. She expresses that when using the bathroom today, she did not experience these symptoms. She confirms low back pain recently. Patient states that she has massive stones in both kidneys that would need surgery if they were to be removed. Patient reports that she manages her chronic low back pain with [place] gel with relief. She denies fever, chills, new/worsening back pain and diarrhea.  Has seen [facility] as recently as July 2023 - note was reviewed with patient. She saw Dr Berneice Heinrich. She has persistent hematuria noted by them and followed yearly.     Past Medical History:  Diagnosis Date   Arthritis    Cancer    Diabetes mellitus without complication    GERD (gastroesophageal reflux disease)    Hyperlipidemia    Hypertension    Thyroid disease      Social History   Tobacco Use   Smoking status: Former    Packs/day: 1.00    Years: 15.00    Additional pack years: 0.00    Total pack years: 15.00    Types: Cigarettes    Start date: 09/14/1955    Quit date: 07/14/1969    Years since quitting: 53.5   Smokeless tobacco: Never   Tobacco comments:    quit 40 years ago  Substance Use Topics   Alcohol use: Never    Alcohol/week: 0.0 standard drinks of alcohol   Drug use: Never    Past Surgical History:  Procedure Laterality Date   BREAST SURGERY     MELANOMA EXCISION     TONSILLECTOMY      Family History  Problem Relation Age of Onset   Cancer Mother    Hypertension Father     No Known Allergies  Current Medications:   Current Outpatient Medications:    azelastine (ASTELIN) 0.1 % nasal spray,  Place 2 sprays into both nostrils 2 (two) times daily., Disp: 30 mL, Rfl: 12   blood glucose meter kit and supplies KIT, Dispense based on patient and insurance preference. Use up to four times daily as directed. (FOR ICD-9 250.00, 250.01)., Disp: 1 each, Rfl: 0   Cholecalciferol (VITAMIN D-3) 1000 UNITS CAPS, Take 2,000 Units by mouth 2 (two) times daily., Disp: , Rfl:    cinacalcet (SENSIPAR) 30 MG tablet, Take 1 tablet (30 mg total) by mouth daily with breakfast., Disp: 90 tablet, Rfl: 1   glimepiride (AMARYL) 2 MG tablet, TAKE 1 TABLET(2 MG) BY MOUTH DAILY WITH BREAKFAST, Disp: 90 tablet, Rfl: 3   glucose blood (ONETOUCH VERIO) test strip, TEST FOUR TIMES DAILY, Disp: 100 strip, Rfl: 3   losartan (COZAAR) 50 MG tablet, TAKE 1 TABLET BY MOUTH EVERY DAY, Disp: 90 tablet, Rfl: 1   Multiple Vitamins-Minerals (CENTRUM SILVER 50+WOMEN PO), Take 1 tablet by mouth daily., Disp: , Rfl:    nystatin cream (MYCOSTATIN), Apply 1 application. topically 2 (two) times daily., Disp: 30 g, Rfl: 0   omeprazole (PRILOSEC) 20 MG capsule, TAKE 1 CAPSULE(20 MG) BY MOUTH DAILY, Disp: 90 capsule, Rfl: 3   OZEMPIC, 1 MG/DOSE, 4 MG/3ML SOPN, INJECT 1 MG UNDER  THE SKIN ONCE WEEK., Disp: 3 mL, Rfl: 3   rosuvastatin (CRESTOR) 10 MG tablet, TAKE 1 TABLET(10 MG) BY MOUTH DAILY, Disp: 30 tablet, Rfl: 2   sodium chloride (MURO 128) 2 % ophthalmic solution, Place 1 drop into both eyes 2 (two) times daily., Disp: , Rfl:    Review of Systems:   Review of Systems  Constitutional:  Negative for chills and fever.  Gastrointestinal:  Negative for diarrhea.  Genitourinary:  Positive for dysuria and urgency.  Musculoskeletal:  Positive for back pain (low).    Vitals:   Vitals:   02/02/23 1434  BP: 136/70  Pulse: 73  Temp: 97.8 F (36.6 C)  TempSrc: Temporal  SpO2: 96%  Weight: 143 lb 8 oz (65.1 kg)  Height: 5' (1.524 m)     Body mass index is 28.03 kg/m.  Physical Exam:   Physical Exam Constitutional:       General: She is not in acute distress.    Appearance: Normal appearance. She is not ill-appearing.  HENT:     Head: Normocephalic and atraumatic.     Right Ear: External ear normal.     Left Ear: External ear normal.  Eyes:     Extraocular Movements: Extraocular movements intact.     Pupils: Pupils are equal, round, and reactive to light.  Cardiovascular:     Rate and Rhythm: Normal rate and regular rhythm.     Heart sounds: Normal heart sounds. No murmur heard.    No gallop.  Pulmonary:     Effort: Pulmonary effort is normal. No respiratory distress.     Breath sounds: Normal breath sounds. No wheezing or rales.  Abdominal:     Tenderness: There is no right CVA tenderness or left CVA tenderness.  Skin:    General: Skin is warm and dry.  Neurological:     Mental Status: She is alert and oriented to person, place, and time.  Psychiatric:        Judgment: Judgment normal.    Results for orders placed or performed in visit on 02/02/23  POCT urinalysis dipstick  Result Value Ref Range   Color, UA yellow    Clarity, UA cloudy    Glucose, UA Negative Negative   Bilirubin, UA Negative    Ketones, UA Negative    Spec Grav, UA 1.010 1.010 - 1.025   Blood, UA 3    pH, UA 7.0 5.0 - 8.0   Protein, UA Positive (A) Negative   Urobilinogen, UA 0.2 0.2 or 1.0 E.U./dL   Nitrite, UA Negative    Leukocytes, UA Large (3+) (A) Negative   Appearance     Odor       Assessment and Plan:   Dysuria No red flags UA consistent with leukocytes and hematuria Discussed recommendations from Dr Emmaline LifeManny's last note "avoid further antimicrobials without positive culture data"  Since we are heading into the weekend, did prescribe safety net rx of keflex should symptoms increase over the weekend We will reach out to her on Monday when culture has returned Worsening precautions advised in the interim   I,Verona Buck,acting as a scribe for Energy East CorporationSamantha Sari Cogan, PA.,have documented all relevant  documentation on the behalf of Jarold MottoSamantha Harith Mccadden, PA,as directed by  Jarold MottoSamantha Dinah Lupa, PA while in the presence of Jarold MottoSamantha Zyrus Hetland, [place].  I, Jarold MottoSamantha Landry Kamath, [place], have reviewed all documentation for this visit. The documentation on 02/02/23 for the exam, diagnosis, procedures, and orders are all accurate and complete.   Jarold MottoSamantha Kariem Wolfson, PA-C

## 2023-02-02 NOTE — Patient Instructions (Signed)
It was great to see you!  I have sent in cephalexin antibiotic for you Please start this if you start to feel worse, or you may also consider taking this now  I will be in touch with your urine culture results  Please call [facility] -- to follow-up with Dr Berneice Heinrich on your blood in your urine  [phone]  General instructions Make sure you: Pee until your bladder is empty. Do not hold pee for a long time. Empty your bladder after sex. Wipe from front to back after pooping if you are a female. Use each tissue one time when you wipe. Drink enough fluid to keep your pee pale yellow. Keep all follow-up visits as told by your doctor. This is important. Contact a doctor if: You do not get better after 1-2 days. Your symptoms go away and then come back. Get help right away if: You have very bad back pain. You have very bad pain in your lower belly. You have a fever. You are sick to your stomach (nauseous). You are throwing up.   Take care,  Jarold Motto PA-C

## 2023-02-03 LAB — URINE CULTURE
MICRO NUMBER:: 14788526
SPECIMEN QUALITY:: ADEQUATE

## 2023-02-05 DIAGNOSIS — N200 Calculus of kidney: Secondary | ICD-10-CM | POA: Diagnosis not present

## 2023-02-05 DIAGNOSIS — R300 Dysuria: Secondary | ICD-10-CM | POA: Diagnosis not present

## 2023-02-15 ENCOUNTER — Telehealth: Payer: Self-pay | Admitting: Family Medicine

## 2023-02-15 NOTE — Telephone Encounter (Signed)
Placed in PCP sign folder.

## 2023-02-15 NOTE — Telephone Encounter (Signed)
Pt would like a call back about a paper stating her goal weight. Please advise.

## 2023-02-16 NOTE — Telephone Encounter (Signed)
Patient states that she is comfortable with the weight she is, that she cannot lose that much weight. Patient is wanting goal weight to be 150 lbs

## 2023-02-16 NOTE — Telephone Encounter (Signed)
Recommend goal weight of 120-135lb.  Katina Degree. [name], MD 02/16/2023 12:46 PM

## 2023-02-16 NOTE — Telephone Encounter (Signed)
Can we get more info? IS there a form that she needs completed?  [name]. [name], MD 02/16/2023 1:41 PM

## 2023-02-17 ENCOUNTER — Other Ambulatory Visit: Payer: Self-pay | Admitting: Family Medicine

## 2023-02-17 DIAGNOSIS — C50919 Malignant neoplasm of unspecified site of unspecified female breast: Secondary | ICD-10-CM

## 2023-02-17 DIAGNOSIS — E785 Hyperlipidemia, unspecified: Secondary | ICD-10-CM

## 2023-03-01 ENCOUNTER — Encounter: Payer: Self-pay | Admitting: Family Medicine

## 2023-03-01 ENCOUNTER — Ambulatory Visit (INDEPENDENT_AMBULATORY_CARE_PROVIDER_SITE_OTHER): Payer: PPO | Admitting: Family Medicine

## 2023-03-01 VITALS — BP 118/75 | HR 79 | Temp 97.3°F | Ht 60.0 in | Wt 143.8 lb

## 2023-03-01 DIAGNOSIS — Z7985 Long-term (current) use of injectable non-insulin antidiabetic drugs: Secondary | ICD-10-CM

## 2023-03-01 DIAGNOSIS — E1159 Type 2 diabetes mellitus with other circulatory complications: Secondary | ICD-10-CM | POA: Diagnosis not present

## 2023-03-01 DIAGNOSIS — I152 Hypertension secondary to endocrine disorders: Secondary | ICD-10-CM

## 2023-03-01 DIAGNOSIS — J310 Chronic rhinitis: Secondary | ICD-10-CM

## 2023-03-01 DIAGNOSIS — E119 Type 2 diabetes mellitus without complications: Secondary | ICD-10-CM

## 2023-03-01 DIAGNOSIS — Z7984 Long term (current) use of oral hypoglycemic drugs: Secondary | ICD-10-CM

## 2023-03-01 LAB — POCT GLYCOSYLATED HEMOGLOBIN (HGB A1C): Hemoglobin A1C: 6.6 % — AB (ref 4.0–5.6)

## 2023-03-01 NOTE — Progress Notes (Signed)
   [name] is a 88 y.o. female who presents today for an office visit.  Assessment/Plan:  Chronic Problems Addressed Today: Diabetes mellitus without complication (HCC) A1c 6.6.  No symptomatic lows.  Will continue Ozempic 1 mg weekly and glimepiride 2 mg daily.  Will recheck in 3 months.  If A1c is still at goal we will consider stopping her glimepiride.  Hypertension associated with diabetes (HCC) Blood pressure at goal on losartan 50 mg daily.  Rhinitis Doing much better now.  She can continue taking over-the-counter Mucinex as needed.     Subjective:  HPI:  See A/P for status of chronic conditions.  Patient is here today for 43-month follow-up.  Last time I saw her A1c was 7.3.  We continued her on Ozempic 1 mg weekly and Amaryl 2 mg daily.  She has been doing well with this.  She started taking Mucinex for sore throat and hoarseness a couple weeks ago.  This worked well.  No fevers or chills.  Symptoms have resolved.      Objective:  Physical Exam: BP 118/75   Pulse 79   Temp (!) 97.3 F (36.3 C) (Temporal)   Ht 5' (1.524 m)   Wt 143 lb 12.8 oz (65.2 kg)   SpO2 97%   BMI 28.08 kg/m   Wt Readings from Last 3 Encounters:  03/01/23 143 lb 12.8 oz (65.2 kg)  02/02/23 143 lb 8 oz (65.1 kg)  12/01/22 143 lb 6.4 oz (65 kg)    Gen: No acute distress, resting comfortably CV: Regular rate and rhythm with no murmurs appreciated Pulm: Normal work of breathing, clear to auscultation bilaterally with no crackles, wheezes, or rhonchi Neuro: Grossly normal, moves all extremities Psych: Normal affect and thought content      [name] [name], MD 03/01/2023 1:57 PM

## 2023-03-01 NOTE — Assessment & Plan Note (Signed)
A1c 6.6.  No symptomatic lows.  Will continue Ozempic 1 mg weekly and glimepiride 2 mg daily.  Will recheck in 3 months.  If A1c is still at goal we will consider stopping her glimepiride.

## 2023-03-01 NOTE — Patient Instructions (Signed)
It was very nice to see you today!  Your numbers look great today! Keep up the great work!  No medication changes today.  No follow-ups on file.   Take care, Dr [name]  PLEASE NOTE:  If you had any lab tests, please let us know if you have not heard back within a few days. You may see your results on mychart before we have a chance to review them but we will give you a call once they are reviewed by [name].   If we ordered any referrals today, please let us know if you have not heard from their office within the next week.   If you had any urgent prescriptions sent in today, please check with the pharmacy within an hour of our visit to make sure the prescription was transmitted appropriately.   Please try these tips to maintain a healthy lifestyle:  Eat at least 3 REAL meals and 1-2 snacks per day.  Aim for no more than 5 hours between eating.  If you eat breakfast, please do so within one hour of getting up.   Each meal should contain half fruits/vegetables, one quarter protein, and one quarter carbs (no bigger than a computer mouse)  Cut down on sweet beverages. This includes juice, soda, and sweet tea.   Drink at least 1 glass of water with each meal and aim for at least 8 glasses per day  Exercise at least 150 minutes every week.

## 2023-03-01 NOTE — Assessment & Plan Note (Signed)
Blood pressure at goal on losartan 50 mg daily.

## 2023-03-01 NOTE — Assessment & Plan Note (Signed)
Doing much better now.  She can continue taking over-the-counter Mucinex as needed.

## 2023-03-27 ENCOUNTER — Other Ambulatory Visit: Payer: Self-pay | Admitting: Family Medicine

## 2023-03-28 ENCOUNTER — Other Ambulatory Visit: Payer: Self-pay | Admitting: Family Medicine

## 2023-03-29 ENCOUNTER — Other Ambulatory Visit: Payer: Self-pay | Admitting: Family Medicine

## 2023-04-02 ENCOUNTER — Telehealth: Payer: Self-pay | Admitting: Family Medicine

## 2023-04-02 NOTE — Telephone Encounter (Signed)
Spoke with patient, requesting changes on medication due to cost  Will call insurance with alternatives

## 2023-04-02 NOTE — Telephone Encounter (Signed)
Agree with her checking with insurance first.  [name]. [name], MD 04/02/2023 9:51 AM

## 2023-04-02 NOTE — Telephone Encounter (Signed)
Patient states Ozempic costs $236.00 out of pocket.  Patient states her next does of Ozempic is due 04/04/23  Requests RX for an affordable alternative be sent to: [facility] [phone] - [place], [place] - [address] AT [facility] [name] Phone: [phone]  Fax: [phone]

## 2023-04-27 DIAGNOSIS — Z1231 Encounter for screening mammogram for malignant neoplasm of breast: Secondary | ICD-10-CM | POA: Diagnosis not present

## 2023-04-27 LAB — HM MAMMOGRAPHY

## 2023-04-30 ENCOUNTER — Encounter: Payer: Self-pay | Admitting: Family Medicine

## 2023-05-02 ENCOUNTER — Telehealth: Payer: Self-pay | Admitting: *Deleted

## 2023-05-02 DIAGNOSIS — R922 Inconclusive mammogram: Secondary | ICD-10-CM | POA: Diagnosis not present

## 2023-05-02 DIAGNOSIS — R921 Mammographic calcification found on diagnostic imaging of breast: Secondary | ICD-10-CM | POA: Diagnosis not present

## 2023-05-02 NOTE — Telephone Encounter (Signed)
[facility] order diagnostic mammogram form sign and faxed to [phone] Form Placed to be scan in patient chart

## 2023-05-07 ENCOUNTER — Encounter: Payer: Self-pay | Admitting: Family Medicine

## 2023-05-08 DIAGNOSIS — R922 Inconclusive mammogram: Secondary | ICD-10-CM | POA: Diagnosis not present

## 2023-05-08 DIAGNOSIS — D0511 Intraductal carcinoma in situ of right breast: Secondary | ICD-10-CM | POA: Diagnosis not present

## 2023-05-09 ENCOUNTER — Encounter: Payer: Self-pay | Admitting: Family Medicine

## 2023-05-10 ENCOUNTER — Encounter: Payer: Self-pay | Admitting: Family Medicine

## 2023-05-10 ENCOUNTER — Telehealth: Payer: Self-pay | Admitting: Hematology and Oncology

## 2023-05-10 NOTE — Telephone Encounter (Signed)
LVM for patient to return my call in reference to [facility] appointment on 7/17 at 1215

## 2023-05-11 ENCOUNTER — Telehealth: Payer: Self-pay | Admitting: Hematology and Oncology

## 2023-05-11 DIAGNOSIS — E1165 Type 2 diabetes mellitus with hyperglycemia: Secondary | ICD-10-CM | POA: Diagnosis not present

## 2023-05-11 DIAGNOSIS — E559 Vitamin D deficiency, unspecified: Secondary | ICD-10-CM | POA: Diagnosis not present

## 2023-05-11 DIAGNOSIS — E785 Hyperlipidemia, unspecified: Secondary | ICD-10-CM | POA: Diagnosis not present

## 2023-05-11 DIAGNOSIS — E663 Overweight: Secondary | ICD-10-CM | POA: Diagnosis not present

## 2023-05-11 DIAGNOSIS — C50911 Malignant neoplasm of unspecified site of right female breast: Secondary | ICD-10-CM | POA: Diagnosis not present

## 2023-05-11 DIAGNOSIS — G8929 Other chronic pain: Secondary | ICD-10-CM | POA: Diagnosis not present

## 2023-05-11 DIAGNOSIS — H9190 Unspecified hearing loss, unspecified ear: Secondary | ICD-10-CM | POA: Diagnosis not present

## 2023-05-11 DIAGNOSIS — G4700 Insomnia, unspecified: Secondary | ICD-10-CM | POA: Diagnosis not present

## 2023-05-11 DIAGNOSIS — E1169 Type 2 diabetes mellitus with other specified complication: Secondary | ICD-10-CM | POA: Diagnosis not present

## 2023-05-11 DIAGNOSIS — H04123 Dry eye syndrome of bilateral lacrimal glands: Secondary | ICD-10-CM | POA: Diagnosis not present

## 2023-05-11 DIAGNOSIS — E1139 Type 2 diabetes mellitus with other diabetic ophthalmic complication: Secondary | ICD-10-CM | POA: Diagnosis not present

## 2023-05-11 DIAGNOSIS — E213 Hyperparathyroidism, unspecified: Secondary | ICD-10-CM | POA: Diagnosis not present

## 2023-05-11 NOTE — Telephone Encounter (Signed)
Spoke to patient to confirm upcoming afternoon [facility] clinic appointment on 7/17, paperwork will be sent via [facility].   Gave location and time, also informed patient that the surgeon's office would be calling as well to get information from them similar to the packet that they will be receiving so make sure to do both.  Reminded patient that all providers will be coming to the clinic to see them HERE and if they had any questions to not hesitate to reach back out to myself or their navigators.

## 2023-05-14 ENCOUNTER — Encounter: Payer: Self-pay | Admitting: *Deleted

## 2023-05-14 DIAGNOSIS — D0511 Intraductal carcinoma in situ of right breast: Secondary | ICD-10-CM | POA: Insufficient documentation

## 2023-05-15 ENCOUNTER — Telehealth: Payer: Self-pay | Admitting: *Deleted

## 2023-05-15 ENCOUNTER — Encounter: Payer: Self-pay | Admitting: *Deleted

## 2023-05-15 DIAGNOSIS — D0511 Intraductal carcinoma in situ of right breast: Secondary | ICD-10-CM

## 2023-05-15 NOTE — Telephone Encounter (Signed)
Pt called and notified she was a previous pt of Dr. Myna Hidalgo. She would like to have her care with him. [facility] appt cx. Pt will be seen by Dr. Manus Rudd on 7/26. Referral sent for pt to see Dr. Myna Hidalgo.

## 2023-05-16 ENCOUNTER — Inpatient Hospital Stay: Payer: PPO

## 2023-05-16 ENCOUNTER — Ambulatory Visit: Payer: PPO | Admitting: Radiation Oncology

## 2023-05-16 ENCOUNTER — Inpatient Hospital Stay: Payer: PPO | Admitting: Hematology and Oncology

## 2023-05-16 ENCOUNTER — Ambulatory Visit: Payer: PPO | Admitting: Physical Therapy

## 2023-05-16 NOTE — Progress Notes (Signed)
Called and left message requesting return call. 05/15/2023 at 1330.  Oncology Nurse Navigator Documentation     05/16/2023   10:30 AM  Oncology Nurse Navigator Flowsheets  Navigator Follow Up Date: 05/21/2023  Navigator Follow Up Reason: New Patient Appointment  Navigator Location [facility]  Navigator Encounter Type Telephone  Telephone Incoming Call  Multidisiplinary Clinic Date 05/16/2023  Multidisiplinary Clinic Type Breast  Patient Visit Type MedOnc  Treatment Phase Pre-Tx/Tx Discussion  Barriers/Navigation Needs Coordination of Care;Education  Education Other  Interventions Coordination of Care;Education  Acuity Level 2-Minimal Needs (1-2 Barriers Identified)  Coordination of Care Appts  Education Method Verbal  Time Spent with Patient 30

## 2023-05-17 ENCOUNTER — Other Ambulatory Visit: Payer: Self-pay | Admitting: Family Medicine

## 2023-05-17 DIAGNOSIS — R310 Gross hematuria: Secondary | ICD-10-CM | POA: Diagnosis not present

## 2023-05-17 DIAGNOSIS — N200 Calculus of kidney: Secondary | ICD-10-CM | POA: Diagnosis not present

## 2023-05-17 DIAGNOSIS — R300 Dysuria: Secondary | ICD-10-CM | POA: Diagnosis not present

## 2023-05-21 ENCOUNTER — Inpatient Hospital Stay (HOSPITAL_BASED_OUTPATIENT_CLINIC_OR_DEPARTMENT_OTHER): Payer: PPO | Admitting: Hematology & Oncology

## 2023-05-21 ENCOUNTER — Other Ambulatory Visit: Payer: Self-pay

## 2023-05-21 ENCOUNTER — Inpatient Hospital Stay: Payer: PPO | Attending: Hematology & Oncology

## 2023-05-21 VITALS — BP 135/49 | HR 57 | Temp 98.1°F | Resp 16 | Ht 62.0 in | Wt 138.0 lb

## 2023-05-21 DIAGNOSIS — Z87891 Personal history of nicotine dependence: Secondary | ICD-10-CM | POA: Diagnosis not present

## 2023-05-21 DIAGNOSIS — K219 Gastro-esophageal reflux disease without esophagitis: Secondary | ICD-10-CM | POA: Diagnosis not present

## 2023-05-21 DIAGNOSIS — Z85820 Personal history of malignant melanoma of skin: Secondary | ICD-10-CM | POA: Insufficient documentation

## 2023-05-21 DIAGNOSIS — Z79899 Other long term (current) drug therapy: Secondary | ICD-10-CM | POA: Insufficient documentation

## 2023-05-21 DIAGNOSIS — E119 Type 2 diabetes mellitus without complications: Secondary | ICD-10-CM | POA: Diagnosis not present

## 2023-05-21 DIAGNOSIS — I10 Essential (primary) hypertension: Secondary | ICD-10-CM | POA: Insufficient documentation

## 2023-05-21 DIAGNOSIS — Z923 Personal history of irradiation: Secondary | ICD-10-CM | POA: Insufficient documentation

## 2023-05-21 DIAGNOSIS — C50011 Malignant neoplasm of nipple and areola, right female breast: Secondary | ICD-10-CM

## 2023-05-21 DIAGNOSIS — D0511 Intraductal carcinoma in situ of right breast: Secondary | ICD-10-CM | POA: Diagnosis not present

## 2023-05-21 DIAGNOSIS — E785 Hyperlipidemia, unspecified: Secondary | ICD-10-CM | POA: Insufficient documentation

## 2023-05-21 DIAGNOSIS — C50012 Malignant neoplasm of nipple and areola, left female breast: Secondary | ICD-10-CM

## 2023-05-21 DIAGNOSIS — R978 Other abnormal tumor markers: Secondary | ICD-10-CM | POA: Diagnosis not present

## 2023-05-21 DIAGNOSIS — Z7984 Long term (current) use of oral hypoglycemic drugs: Secondary | ICD-10-CM | POA: Insufficient documentation

## 2023-05-21 DIAGNOSIS — Z170 Estrogen receptor positive status [ER+]: Secondary | ICD-10-CM

## 2023-05-21 DIAGNOSIS — E079 Disorder of thyroid, unspecified: Secondary | ICD-10-CM | POA: Insufficient documentation

## 2023-05-21 LAB — CBC WITH DIFFERENTIAL (CANCER CENTER ONLY)
Abs Immature Granulocytes: 0.01 10*3/uL (ref 0.00–0.07)
Basophils Absolute: 0.1 10*3/uL (ref 0.0–0.1)
Basophils Relative: 1 %
Eosinophils Absolute: 0.1 10*3/uL (ref 0.0–0.5)
Eosinophils Relative: 1 %
HCT: 46.4 % — ABNORMAL HIGH (ref 36.0–46.0)
Hemoglobin: 15.6 g/dL — ABNORMAL HIGH (ref 12.0–15.0)
Immature Granulocytes: 0 %
Lymphocytes Relative: 38 %
Lymphs Abs: 3.2 10*3/uL (ref 0.7–4.0)
MCH: 30.6 pg (ref 26.0–34.0)
MCHC: 33.6 g/dL (ref 30.0–36.0)
MCV: 91.2 fL (ref 80.0–100.0)
Monocytes Absolute: 0.5 10*3/uL (ref 0.1–1.0)
Monocytes Relative: 6 %
Neutro Abs: 4.5 10*3/uL (ref 1.7–7.7)
Neutrophils Relative %: 54 %
Platelet Count: 232 10*3/uL (ref 150–400)
RBC: 5.09 MIL/uL (ref 3.87–5.11)
RDW: 13 % (ref 11.5–15.5)
WBC Count: 8.3 10*3/uL (ref 4.0–10.5)
nRBC: 0 % (ref 0.0–0.2)

## 2023-05-21 LAB — CMP (CANCER CENTER ONLY)
ALT: 20 U/L (ref 0–44)
AST: 19 U/L (ref 15–41)
Albumin: 4.5 g/dL (ref 3.5–5.0)
Alkaline Phosphatase: 73 U/L (ref 38–126)
Anion gap: 8 (ref 5–15)
BUN: 11 mg/dL (ref 8–23)
CO2: 31 mmol/L (ref 22–32)
Calcium: 10.5 mg/dL — ABNORMAL HIGH (ref 8.9–10.3)
Chloride: 106 mmol/L (ref 98–111)
Creatinine: 0.93 mg/dL (ref 0.44–1.00)
GFR, Estimated: 60 mL/min — ABNORMAL LOW (ref >60)
Glucose, Bld: 114 mg/dL — ABNORMAL HIGH (ref 70–99)
Potassium: 4.1 mmol/L (ref 3.5–5.1)
Sodium: 145 mmol/L (ref 135–145)
Total Bilirubin: 0.7 mg/dL (ref 0.3–1.2)
Total Protein: 7.3 g/dL (ref 6.5–8.1)

## 2023-05-21 NOTE — Progress Notes (Signed)
Referral MD  Reason for Referral: DCIS of the right breast-ER/PR positive  Chief Complaint  Patient presents with   Follow-up  : I have breast cancer again.  HPI: [name] is very well-known to [name].  She is a very charming 88 year old white female.  We have seen her in the past for a stage I invasive ductal carcinoma of the left breast.  She was diagnosed back in 2007.  She had surgery and radiation therapy.  She was then on Femara for 5 years.  She completed this back in June 2013.  We last saw her back in 2018.  She has been quite busy.  She has had 2 husband both died.  1 died of cancer 1 died after breaking his hip.  She still gets mammograms.  She had a mammogram recently which showed a suspicious area of calcification that measures 7 mm in the right breast.  She subsequently had a biopsy.  This was done on 05/08/2023.  The pathology report ([id number]) showed DCIS that was intermediate grade.  There is some necrosis.  The DCIS measures 4.5 mm.  The DCIS was ER positive/PR positive.  She is subsequently was sent to the [name] for evaluation.  She sees [name] of [name] surgery this week.  She feels well.  She looks great.  She has no complaints.  She has had no bony pain.  She has had no issues with COVID.  She has had no change in bowel or bladder habits.  She has had no rashes.  There is been no bleeding.  Overall, I would say her performance status is probably ECOG 1.   Past Medical History:  Diagnosis Date   Arthritis    Cancer (HCC)    Diabetes mellitus without complication (HCC)    GERD (gastroesophageal reflux disease)    Hyperlipidemia    Hypertension    Thyroid disease   :   Past Surgical History:  Procedure Laterality Date   BREAST SURGERY     MELANOMA EXCISION     TONSILLECTOMY    :   Current Outpatient Medications:    blood glucose meter kit and supplies KIT, Dispense based on patient and insurance preference.  Use up to four times daily as directed. (FOR ICD-9 250.00, 250.01)., Disp: 1 each, Rfl: 0   Cholecalciferol (VITAMIN D-3) 1000 UNITS CAPS, Take 2,000 Units by mouth 2 (two) times daily., Disp: , Rfl:    cinacalcet (SENSIPAR) 30 MG tablet, Take 1 tablet (30 mg total) by mouth daily with breakfast., Disp: 90 tablet, Rfl: 1   glimepiride (AMARYL) 2 MG tablet, TAKE 1 TABLET(2 MG) BY MOUTH DAILY WITH BREAKFAST, Disp: 90 tablet, Rfl: 3   glucose blood (ONETOUCH VERIO) test strip, USE TO TEST BLOOD SUGAR FOUR TIMES DAILY., Disp: 100 strip, Rfl: 3   losartan (COZAAR) 50 MG tablet, TAKE 1 TABLET BY MOUTH EVERY DAY, Disp: 90 tablet, Rfl: 1   Multiple Vitamins-Minerals (CENTRUM SILVER 50+WOMEN PO), Take 1 tablet by mouth daily., Disp: , Rfl:    nystatin cream (MYCOSTATIN), Apply 1 application. topically 2 (two) times daily., Disp: 30 g, Rfl: 0   omeprazole (PRILOSEC) 20 MG capsule, TAKE 1 CAPSULE(20 MG) BY MOUTH DAILY, Disp: 90 capsule, Rfl: 3   OZEMPIC, 1 MG/DOSE, 4 MG/3ML SOPN, INJECT 1MG  INTO THE SKIN ONCE WEEKLY, Disp: 3 mL, Rfl: 3   rosuvastatin (CRESTOR) 10 MG tablet, TAKE 1 TABLET(10 MG) BY MOUTH DAILY, Disp: 30 tablet, Rfl: 2  sodium chloride (MURO 128) 2 % ophthalmic solution, Place 1 drop into both eyes 2 (two) times daily., Disp: , Rfl: :  :  No Known Allergies:   Family History  Problem Relation Age of Onset   Cancer Mother    Hypertension Father   :   Social History   Socioeconomic History   Marital status: Married    Spouse name: Not on file   Number of children: Not on file   Years of education: Not on file   Highest education level: Not on file  Occupational History   Occupation: retired  Tobacco Use   Smoking status: Former    Current packs/day: 0.00    Average packs/day: 1 pack/day for 15.0 years (15.0 ttl pk-yrs)    Types: Cigarettes    Start date: 09/14/1955    Quit date: 07/14/1969    Years since quitting: 53.8   Smokeless tobacco: Never   Tobacco comments:     quit 40 years ago  Substance and Sexual Activity   Alcohol use: Never    Alcohol/week: 0.0 standard drinks of alcohol   Drug use: Never   Sexual activity: Not Currently  Other Topics Concern   Not on file  Social History Narrative   Not on file   Social Determinants of Health   Financial Resource Strain: Low Risk  (07/10/2022)   Overall Financial Resource Strain (CARDIA)    Difficulty of Paying Living Expenses: Not hard at all  Food Insecurity: No Food Insecurity (07/10/2022)   Hunger Vital Sign    Worried About Running Out of Food in the Last Year: Never true    Ran Out of Food in the Last Year: Never true  Transportation Needs: No Transportation Needs (07/10/2022)   PRAPARE - Administrator, Civil Service (Medical): No    Lack of Transportation (Non-Medical): No  Physical Activity: Insufficiently Active (07/10/2022)   Exercise Vital Sign    Days of Exercise per Week: 5 days    Minutes of Exercise per Session: 10 min  Stress: No Stress Concern Present (07/10/2022)   [employer] of Occupational Health - Occupational Stress Questionnaire    Feeling of Stress : Not at all  Social Connections: Socially Integrated (07/10/2022)   Social Connection and Isolation Panel [NHANES]    Frequency of Communication with Friends and Family: More than three times a week    Frequency of Social Gatherings with Friends and Family: More than three times a week    Attends Religious Services: More than 4 times per year    Active Member of [employer] or Organizations: Yes    Attends Banker Meetings: 1 to 4 times per year    Marital Status: Married  Catering manager Violence: Not At Risk (07/10/2022)   Humiliation, Afraid, Rape, and Kick questionnaire    Fear of Current or Ex-Partner: No    Emotionally Abused: No    Physically Abused: No    Sexually Abused: No  :  Pertinent items are noted in HPI.  Exam: Vital signs show temperature of 98.1.  Pulse 57.  Blood pressure  135/49.  Weight is 138 pounds.  @IPVITALS @ Physical Exam Vitals reviewed.  Constitutional:      Comments: Breast exam shows left breast with the lumpectomy at the 7 o'clock position.  This is well-healed.  There is no left axillary adenopathy.  Right breast shows a biopsy site at the 12 o'clock position.  This is about a centimeter from the areola.  There is no mass that is noted.  There may be little bit of firmness at the lumpectomy site.  There is no right axillary adenopathy.  HENT:     Head: Normocephalic and atraumatic.  Eyes:     Pupils: Pupils are equal, round, and reactive to light.  Cardiovascular:     Rate and Rhythm: Normal rate and regular rhythm.     Heart sounds: Normal heart sounds.  Pulmonary:     Effort: Pulmonary effort is normal.     Breath sounds: Normal breath sounds.  Abdominal:     General: Bowel sounds are normal.     Palpations: Abdomen is soft.  Musculoskeletal:        General: No tenderness or deformity. Normal range of motion.     Cervical back: Normal range of motion.     Comments: She does have kyphosis.  Lymphadenopathy:     Cervical: No cervical adenopathy.  Skin:    General: Skin is warm and dry.     Findings: No erythema or rash.  Neurological:     Mental Status: She is alert and oriented to person, place, and time.  Psychiatric:        Behavior: Behavior normal.        Thought Content: Thought content normal.        Judgment: Judgment normal.       Recent Labs    05/21/23 1113  WBC 8.3  HGB 15.6*  HCT 46.4*  PLT 232    Recent Labs    05/21/23 1113  NA 145  K 4.1  CL 106  CO2 31  GLUCOSE 114*  BUN 11  CREATININE 0.93  CALCIUM 10.5*    Blood smear review: None  Pathology: See above    Assessment and Plan: Ms. Devita is a very nice 88 year old white female.  I have known her for probably 17 years or more.  I actually took care of her husband who had esophageal cancer.  Now she has a second cancer.  This is DCIS  of the right breast.  I do not think this is any kind of issue with respect to the DCIS of the left breast that she had back in 2007.  I would think that this can be easily excised.  I think the question is whether or not she is going to need any radiation therapy.  I spoke with Dr. Roselind [facility].  He said that he would offer her short course of radiation therapy if there is a close margin or if this is considered high-grade.  I think that she probably will need an aromatase inhibitor.  We will work on that after she has her lumpectomy.  I suspect the lumpectomy will be done in about a month.  I think we can probably get her back to see [name] in 2 months.  As always her faith is strong.  We had good fellowship.

## 2023-05-22 ENCOUNTER — Other Ambulatory Visit: Payer: Self-pay | Admitting: Family Medicine

## 2023-05-22 ENCOUNTER — Encounter: Payer: Self-pay | Admitting: *Deleted

## 2023-05-22 ENCOUNTER — Other Ambulatory Visit: Payer: Self-pay | Admitting: "Endocrinology

## 2023-05-22 ENCOUNTER — Telehealth: Payer: Self-pay | Admitting: *Deleted

## 2023-05-22 DIAGNOSIS — E785 Hyperlipidemia, unspecified: Secondary | ICD-10-CM

## 2023-05-22 DIAGNOSIS — C50919 Malignant neoplasm of unspecified site of unspecified female breast: Secondary | ICD-10-CM

## 2023-05-22 LAB — CANCER ANTIGEN 27.29: CA 27.29: 10.3 U/mL (ref 0.0–38.6)

## 2023-05-22 NOTE — Telephone Encounter (Signed)
Referral for [facility] Mammography faxed to [phone]  Faxed on 05/22/2023 Form placed to be scan in pt chart

## 2023-05-22 NOTE — Progress Notes (Signed)
This navigator out of the office for new patient appointment.   Patient is scheduled to see Dr Corliss Skains on Friday to discuss surgery. At this time Dr Myna Hidalgo has no further treatment plan needs and will follow patient after lumpectomy.   Decisions regarding radiation will be made after surgical pathology.   Oncology Nurse Navigator Documentation     05/22/2023    9:15 AM  Oncology Nurse Navigator Flowsheets  Navigator Follow Up Date: 05/25/2023  Navigator Follow Up Reason: Review Note  Navigator Location [facility]  Navigator Encounter Type Appt/Treatment Plan Review  Patient Visit Type MedOnc  Treatment Phase Pre-Tx/Tx Discussion  Barriers/Navigation Needs Coordination of Care;Education  Interventions None Required  Acuity Level 2-Minimal Needs (1-2 Barriers Identified)  Time Spent with Patient 15

## 2023-05-25 ENCOUNTER — Ambulatory Visit: Payer: Self-pay | Admitting: Surgery

## 2023-05-25 ENCOUNTER — Encounter: Payer: Self-pay | Admitting: *Deleted

## 2023-05-25 DIAGNOSIS — D0511 Intraductal carcinoma in situ of right breast: Secondary | ICD-10-CM | POA: Diagnosis not present

## 2023-05-25 NOTE — Progress Notes (Signed)
Patient seen by Dr Corliss Skains today. Will plan for lumpectomy. Will follow for surgical date.   Oncology Nurse Navigator Documentation     05/25/2023   12:30 PM  Oncology Nurse Navigator Flowsheets  Navigator Follow Up Date: 06/06/2023  Navigator Follow Up Reason: Surgery  Navigator Location [facility]  Navigator Encounter Type Appt/Treatment Plan Review  Patient Visit Type MedOnc  Treatment Phase Pre-Tx/Tx Discussion  Barriers/Navigation Needs Coordination of Care;Education  Interventions None Required  Acuity Level 2-Minimal Needs (1-2 Barriers Identified)  Time Spent with Patient 15

## 2023-05-25 NOTE — H&P (Signed)
Subjective    Chief Complaint: Breast Cancer   Oncologist - [facility]   History of Present Illness: [name] is a 88 y.o. female who is seen today as an office consultation at the request of Dr. Ginette Otto for evaluation of Breast Cancer .   This is an 88 year old female who was previously treated for stage I invasive ductal carcinoma in 2007.  She had a lumpectomy and radiation therapy followed by Femara for 5 years.  She has continued with annual mammograms.  Recently she was found to have a 7 mm group of pleomorphic calcifications in the retroareolar right breast.  She underwent stereotactic biopsy of this area that revealed ductal carcinoma in situ intermediate grade, ER/PR positive.  She has already seen oncology but is referred to [name] to discuss lumpectomy.  She is accompanied by her daughter today.     Review of Systems: A complete review of systems was obtained from the patient.  I have reviewed this information and discussed as appropriate with the patient.  See HPI as well for other ROS.   Review of Systems  Constitutional: Negative.   HENT: Negative.    Eyes: Negative.   Respiratory: Negative.    Cardiovascular: Negative.   Gastrointestinal: Negative.   Genitourinary: Negative.   Musculoskeletal: Negative.   Skin: Negative.   Neurological: Negative.   Endo/Heme/Allergies: Negative.   Psychiatric/Behavioral: Negative.          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, estrogen receptor positive (CMS/HHS-HCC)   Osteoarthritis   Osteopenia   Other hyperparathyroidism (CMS/HHS-HCC)   Nephrolithiasis        Past Surgical History       Past Surgical History:  Procedure Laterality Date   MASTECTOMY PARTIAL / LUMPECTOMY Right 2011        Allergies  No Known Allergies     Medications Ordered Prior to Encounter        Current Outpatient Medications on File Prior to Visit  Medication Sig Dispense Refill   cinacalcet (SENSIPAR) 30 MG tablet TAKE 1 TABLET(30 MG) BY MOUTH DAILY WITH BREAKFAST       glimepiride (AMARYL) 2 MG tablet Take 2 mg by mouth daily with breakfast       losartan (COZAAR) 50 MG tablet Take 1 tablet by mouth once daily       omeprazole (PRILOSEC) 20 MG DR capsule Take 20 mg by mouth once daily       rosuvastatin (CRESTOR) 10 MG tablet Take 10 mg by mouth once daily       OZEMPIC 1 mg/dose (4 mg/3 mL) pen injector Inject 1 mg subcutaneously every 7 (seven) days       sodium chloride (MURO 128) 2 % ophthalmic solution Apply 1 drop to eye 2 (two) times daily        No current facility-administered medications  on file prior to visit.        Family History  History reviewed. No pertinent family history.      Tobacco Use History  Social History        Tobacco Use  Smoking Status Former   Types: Cigarettes   Start date: 1965  Smokeless Tobacco Never        Social History  Social History         Socioeconomic History   Marital status: Married  Tobacco Use   Smoking status: Former      Types: Cigarettes      Start date: 1965   Smokeless tobacco: Never  Substance and Sexual Activity   Alcohol use: Never   Drug use: Never    Social Determinants of Acupuncturist Strain: Low Risk  (07/10/2022)    Received [facility]    Overall Financial Resource Strain (CARDIA)     Difficulty of Paying Living Expenses: Not hard  at all  Food Insecurity: No Food Insecurity (07/10/2022)    Received from [facility]    Hunger Vital Sign     Worried About Running Out of Food in the Last Year: Never true     Ran Out of Food in the Last Year: Never true  Transportation Needs: No Transportation Needs (07/10/2022)    Received from [facility] - Transportation     Lack of Transportation (Medical): No     Lack of Transportation (Non-Medical): No  Physical Activity: Insufficiently Active (07/10/2022)    [facility]    Exercise Vital Sign     Days of Exercise per Week: 5 days     Minutes of Exercise per Session: 10 min  Stress: No Stress Concern Present (07/10/2022)    Received from [facility] of Occupational Health - Occupational Stress Questionnaire     Feeling of Stress : Not at all  Social Connections: Socially Integrated (07/10/2022)    Received from [facility]    Social Connection and Isolation Panel [NHANES]     Frequency of Communication with Friends and Family: More than three times a week     Frequency of Social Gatherings with Friends and Family: More than three times a week     Attends Religious Services: More than 4 times per year     Active Member of [employer] or Organizations: Yes     Attends Banker Meetings: 1 to 4 times per year     Marital Status: Married        Objective:         Vitals:    05/25/23 1121  BP: 122/70  Pulse: 53  Temp: 36.9 C (98.5 F)  SpO2: 98%  Weight: 62.6 kg (138 lb)  Height: 152.4 cm (5')  PainSc: 0-No pain    Body mass index is 26.95 kg/m.   Physical Exam    Constitutional:  WDWN in NAD, conversant, no obvious deformities; lying in bed comfortably Eyes:  Pupils equal, round; sclera anicteric; moist conjunctiva; no lid lag HENT:  Oral mucosa moist; good dentition  Neck:  No masses palpated, trachea midline; no thyromegaly Lungs:  CTA bilaterally; normal respiratory effort Breasts:  symmetric, no nipple  changes; right breast shows some slight erythema around the biopsy site.  No palpable masses.  No axillary lymphadenopathy. Left breast shows a healed lumpectomy incision and no palpable masses  or axillary lymphadenopathy CV:  Regular rate and rhythm; no murmurs; extremities well-perfused with no edema Abd:  +bowel sounds, soft, non-tender, no palpable organomegaly; no palpable hernias Musc: Normal gait; no apparent clubbing or cyanosis in extremities Lymphatic:  No palpable cervical or axillary lymphadenopathy Skin:  Warm, dry; no sign of jaundice Psychiatric - alert and oriented x 4; calm mood and affect     Labs, Imaging and Diagnostic Testing: Mammogram and ultrasound as described from [facility] Assessment and Plan:  Diagnoses and all orders for this visit:   Intraductal carcinoma in situ of right breast     I had a long discussion with the patient and her daughter.  This is a relatively small area of noninvasive ductal carcinoma.  Recommend right breast radioactive seed localized lumpectomy.  We will try to take wide margins.  The surgical procedure has been discussed with the patient.  Potential risks, benefits, alternative treatments, and expected outcomes have been explained.  All of the patient's questions at this time have been answered.  The likelihood of reaching the patient's treatment goal is good.  The patient understands the proposed surgical procedure and wishes to proceed.     Lissa Morales, MD  05/25/2023 12:17 PM

## 2023-05-25 NOTE — H&P (View-Only) (Signed)
Subjective    Chief Complaint: Breast Cancer   Oncologist - [facility] [place]   History of Present Illness: [name] is a 88 y.o. female who is seen today as an office consultation at the request of Dr. Ginette Otto for evaluation of Breast Cancer .   This is an 88 year old female who was previously treated for stage I invasive ductal carcinoma in 2007.  She had a lumpectomy and radiation therapy followed by [facility] for 5 years.  She has continued with annual mammograms.  Recently she was found to have a 7 mm group of pleomorphic calcifications in the retroareolar right breast.  She underwent stereotactic biopsy of this area that revealed ductal carcinoma in situ intermediate grade, ER/PR positive.  She has already seen oncology but is referred to [name] to discuss lumpectomy.  She is accompanied by her daughter today.     Review of Systems: A complete review of systems was obtained from the patient.  I have reviewed this information and discussed as appropriate with the patient.  See HPI as well for other ROS.   Review of Systems  Constitutional: Negative.   HENT: Negative.    Eyes: Negative.   Respiratory: Negative.    Cardiovascular: Negative.   Gastrointestinal: Negative.   Genitourinary: Negative.   Musculoskeletal: Negative.   Skin: Negative.   Neurological: Negative.   Endo/Heme/Allergies: Negative.   Psychiatric/Behavioral: Negative.          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, estrogen receptor positive (CMS/HHS-HCC)   Osteoarthritis   Osteopenia   Other hyperparathyroidism (CMS/HHS-HCC)   Nephrolithiasis        Past Surgical History       Past Surgical History:  Procedure Laterality Date   MASTECTOMY PARTIAL / LUMPECTOMY Right 2011        Allergies  No Known Allergies     Medications Ordered Prior to Encounter        Current Outpatient Medications on File Prior to Visit  Medication Sig Dispense Refill   cinacalcet (SENSIPAR) 30 MG tablet TAKE 1 TABLET(30 MG) BY MOUTH DAILY WITH BREAKFAST       glimepiride (AMARYL) 2 MG tablet Take 2 mg by mouth daily with breakfast       losartan (COZAAR) 50 MG tablet Take 1 tablet by mouth once daily       omeprazole (PRILOSEC) 20 MG DR capsule Take 20 mg by mouth once daily       rosuvastatin (CRESTOR) 10 MG tablet Take 10 mg by mouth once daily       OZEMPIC 1 mg/dose (4 mg/3 mL) pen injector Inject 1 mg subcutaneously every 7 (seven) days       sodium chloride (MURO 128) 2 % ophthalmic solution Apply 1 drop to eye 2 (two) times daily        No current facility-administered medications  on file prior to visit.        Family History  History reviewed. No pertinent family history.      Tobacco Use History  Social History        Tobacco Use  Smoking Status Former   Types: Cigarettes   Start date: 1965  Smokeless Tobacco Never        Social History  Social History         Socioeconomic History   Marital status: Married  Tobacco Use   Smoking status: Former      Types: Cigarettes      Start date: 1965   Smokeless tobacco: Never  Substance and Sexual Activity   Alcohol use: Never   Drug use: Never    Social Determinants of Acupuncturist Strain: Low Risk  (07/10/2022)    Received from [facility]    Overall Financial Resource Strain (CARDIA)     Difficulty of Paying Living Expenses: Not hard  at all  Food Insecurity: No Food Insecurity (07/10/2022)    Received from [facility]    Hunger Vital Sign     Worried About Running Out of Food in the Last Year: Never true     Ran Out of Food in the Last Year: Never true  Transportation Needs: No Transportation Needs (07/10/2022)    Received from [facility] - Transportation     Lack of Transportation (Medical): No     Lack of Transportation (Non-Medical): No  Physical Activity: Insufficiently Active (07/10/2022)    [facility] from [facility]    Exercise Vital Sign     Days of Exercise per Week: 5 days     Minutes of Exercise per Session: 10 min  Stress: No Stress Concern Present (07/10/2022)    Received from [facility] - Occupational Stress Questionnaire     Feeling of Stress : Not at all  Social Connections: Socially Integrated (07/10/2022)    Received from [facility]    Social Connection and Isolation Panel [NHANES]     Frequency of Communication with Friends and Family: More than three times a week     Frequency of Social Gatherings with Friends and Family: More than three times a week     Attends Religious Services: More than 4 times per year     Active Member of [employer] or Organizations: Yes     Attends Banker Meetings: 1 to 4 times per year     Marital Status: Married        Objective:         Vitals:    05/25/23 1121  BP: 122/70  Pulse: 53  Temp: 36.9 C (98.5 F)  SpO2: 98%  Weight: 62.6 kg (138 lb)  Height: 152.4 cm (5')  PainSc: 0-No pain    Body mass index is 26.95 kg/m.   Physical Exam    Constitutional:  WDWN in NAD, conversant, no obvious deformities; lying in bed comfortably Eyes:  Pupils equal, round; sclera anicteric; moist conjunctiva; no lid lag HENT:  Oral mucosa moist; good dentition  Neck:  No masses palpated, trachea midline; no thyromegaly Lungs:  CTA bilaterally; normal respiratory effort Breasts:  symmetric, no nipple  changes; right breast shows some slight erythema around the biopsy site.  No palpable masses.  No axillary lymphadenopathy. Left breast shows a healed lumpectomy incision and no palpable masses  or axillary lymphadenopathy CV:  Regular rate and rhythm; no murmurs; extremities well-perfused with no edema Abd:  +bowel sounds, soft, non-tender, no palpable organomegaly; no palpable hernias Musc: Normal gait; no apparent clubbing or cyanosis in extremities Lymphatic:  No palpable cervical or axillary lymphadenopathy Skin:  Warm, dry; no sign of jaundice Psychiatric - alert and oriented x 4; calm mood and affect     Labs, Imaging and Diagnostic Testing: Mammogram and ultrasound as described from [facility] Assessment and Plan:  Diagnoses and all orders for this visit:   Intraductal carcinoma in situ of right breast     I had a long discussion with the patient and her daughter.  This is a relatively small area of noninvasive ductal carcinoma.  Recommend right breast radioactive seed localized lumpectomy.  We will try to take wide margins.  The surgical procedure has been discussed with the patient.  Potential risks, benefits, alternative treatments, and expected outcomes have been explained.  All of the patient's questions at this time have been answered.  The likelihood of reaching the patient's treatment goal is good.  The patient understands the proposed surgical procedure and wishes to proceed.     Lissa Morales, MD  05/25/2023 12:17 PM

## 2023-05-28 DIAGNOSIS — E212 Other hyperparathyroidism: Secondary | ICD-10-CM | POA: Diagnosis not present

## 2023-05-29 ENCOUNTER — Other Ambulatory Visit: Payer: Self-pay | Admitting: Surgery

## 2023-05-29 DIAGNOSIS — D0511 Intraductal carcinoma in situ of right breast: Secondary | ICD-10-CM

## 2023-05-30 ENCOUNTER — Encounter (INDEPENDENT_AMBULATORY_CARE_PROVIDER_SITE_OTHER): Payer: Self-pay

## 2023-05-31 ENCOUNTER — Other Ambulatory Visit: Payer: Self-pay

## 2023-05-31 ENCOUNTER — Ambulatory Visit: Payer: PPO | Admitting: "Endocrinology

## 2023-05-31 ENCOUNTER — Encounter: Payer: Self-pay | Admitting: "Endocrinology

## 2023-05-31 ENCOUNTER — Encounter (HOSPITAL_BASED_OUTPATIENT_CLINIC_OR_DEPARTMENT_OTHER): Payer: Self-pay | Admitting: Surgery

## 2023-05-31 VITALS — BP 104/52 | HR 80 | Ht 60.0 in | Wt 137.4 lb

## 2023-05-31 DIAGNOSIS — E212 Other hyperparathyroidism: Secondary | ICD-10-CM

## 2023-05-31 MED ORDER — CINACALCET HCL 30 MG PO TABS: 30.0000 mg | ORAL_TABLET | Freq: Every day | ORAL | 1 refills | Status: DC

## 2023-05-31 NOTE — Progress Notes (Signed)
05/31/2023, 2:26 PM       Endocrinology follow-up note   [name] is a 88 y.o.-year-old female, here for follow-up of hypercalcemia related to hyperparathyroidism. PMD: her  Ardith Dark, MD    Past Medical History:  Diagnosis Date   Arthritis    Cancer [facility])    Diabetes mellitus without complication (HCC)    GERD (gastroesophageal reflux disease)    Hyperlipidemia    Hypertension    Kidney stones    Thyroid disease     Past Surgical History:  Procedure Laterality Date   BACK SURGERY     BREAST SURGERY     MELANOMA EXCISION     TONSILLECTOMY      Social History   Tobacco Use   Smoking status: Former    Current packs/day: 0.00    Average packs/day: 1 pack/day for 15.0 years (15.0 ttl pk-yrs)    Types: Cigarettes    Start date: 09/14/1955    Quit date: 07/14/1969    Years since quitting: 53.9   Smokeless tobacco: Never   Tobacco comments:    quit 40 years ago  Substance Use Topics   Alcohol use: Never    Alcohol/week: 0.0 standard drinks of alcohol   Drug use: Never    Family History  Problem Relation Age of Onset   Cancer Mother    Hypertension Father     Outpatient Encounter Medications as of 05/31/2023  Medication Sig   blood glucose meter kit and supplies KIT Dispense based on patient and insurance preference. Use up to four times daily as directed. (FOR ICD-9 250.00, 250.01).   Cholecalciferol (VITAMIN D-3) 1000 UNITS CAPS Take 2,000 Units by mouth 2 (two) times daily.   cinacalcet (SENSIPAR) 30 MG tablet Take 1 tablet (30 mg total) by mouth daily with breakfast.   glimepiride (AMARYL) 2 MG tablet TAKE 1 TABLET(2 MG) BY MOUTH DAILY WITH BREAKFAST   glucose blood (ONETOUCH VERIO) test strip USE TO TEST BLOOD SUGAR FOUR TIMES DAILY.   losartan (COZAAR) 50 MG tablet TAKE 1 TABLET BY MOUTH EVERY DAY   Multiple Vitamins-Minerals (CENTRUM SILVER 50+WOMEN PO) Take 1 tablet by mouth  daily.   nystatin cream (MYCOSTATIN) Apply 1 application. topically 2 (two) times daily.   omeprazole (PRILOSEC) 20 MG capsule TAKE 1 CAPSULE(20 MG) BY MOUTH DAILY   OZEMPIC, 1 MG/DOSE, 4 MG/3ML SOPN INJECT 1MG  INTO THE SKIN ONCE WEEKLY   rosuvastatin (CRESTOR) 10 MG tablet TAKE 1 TABLET(10 MG) BY MOUTH DAILY   sodium chloride (MURO 128) 2 % ophthalmic solution Place 1 drop into both eyes 2 (two) times daily.   [DISCONTINUED] cinacalcet (SENSIPAR) 30 MG tablet TAKE 1 TABLET(30 MG) BY MOUTH DAILY WITH BREAKFAST   No facility-administered encounter medications on file as of 05/31/2023.    No Known Allergies   HPI  [name] was diagnosed with hypercalcemia in May 2020.   Prior to her last visit, her work-up to help determine the hypercalcemia secondary to primary hyperparathyroidism.    -Since she is not a surgical candidate, she was started on Sensipar,  currently on 30 mg  p.o. every  day with breakfast.  She continues to tolerate this medication.  Her previsit labs show high normal calcium at 10.8 associated with PTH of 60.  She has no new complaints today.  In the interim, she was diagnosed with breast cancer to undergo lumpectomy in 1 week. She has diabetes controlled with recent A1c of 6.6% following up with her PMD.   She also has hyperlipidemia uncontrolled on Crestor 10 mg.  She is known to have recurrent nephrolithiasis.  No history of CKD.   she is not on HCTZ or other thiazide therapy.  She is on vitamin D supplement-4000 units daily.  She is not on calcium supplements. she eats dairy and green, leafy, vegetables on average amounts.  she does not have a family history of hypercalcemia, pituitary tumors, thyroid cancer, or osteoporosis.  -On history, she lost 2 inches of height over the years. She has not done her bone density before this visit.   ROS: Limited as above.  PE: BP (!) 104/52   Pulse 80   Ht 5' (1.524 m)   Wt 137 lb 6.4 oz (62.3 kg)   BMI 26.83 kg/m  , Body mass index is 26.83 kg/m. Wt Readings from Last 3 Encounters:  05/31/23 137 lb 6.4 oz (62.3 kg)  05/21/23 138 lb (62.6 kg)  03/01/23 143 lb 12.8 oz (65.2 kg)      CMP     Component Value Date/Time   NA 143 05/28/2023 0947   NA 145 02/15/2017 1333   NA 144 08/12/2015 1419   K 4.4 05/28/2023 0947   K 4.4 02/15/2017 1333   K 4.2 08/12/2015 1419   CL 102 05/28/2023 0947   CL 104 02/15/2017 1333   CO2 25 05/28/2023 0947   CO2 28 02/15/2017 1333   CO2 29 08/12/2015 1419   GLUCOSE 171 (H) 05/28/2023 0947   GLUCOSE 114 (H) 05/21/2023 1113   GLUCOSE 142 (H) 02/15/2017 1333   BUN 6 (L) 05/28/2023 0947   BUN 13 02/15/2017 1333   BUN 11.8 08/12/2015 1419   CREATININE 0.97 05/28/2023 0947   CREATININE 0.93 05/21/2023 1113   CREATININE 0.82 03/24/2020 1122   CREATININE 0.8 08/12/2015 1419   CALCIUM 10.8 (H) 05/28/2023 0947   CALCIUM 10.2 02/15/2017 1333   CALCIUM 11.1 (H) 08/12/2015 1419   PROT 7.4 05/28/2023 0947   PROT 7.5 02/15/2017 1333   PROT 7.2 08/12/2015 1419   ALBUMIN 4.4 05/28/2023 0947   ALBUMIN 150 MG/L 07/15/2019 1130   ALBUMIN 3.9 08/12/2015 1419   AST 21 05/28/2023 0947   AST 19 05/21/2023 1113   AST 26 08/12/2015 1419   ALT 20 05/28/2023 0947   ALT 20 05/21/2023 1113   ALT 39 02/15/2017 1333   ALT 32 08/12/2015 1419   ALKPHOS 110 05/28/2023 0947   ALKPHOS 76 02/15/2017 1333   ALKPHOS 74 08/12/2015 1419   BILITOT 1.0 05/28/2023 0947   BILITOT 0.7 05/21/2023 1113   BILITOT 0.52 08/12/2015 1419   GFRNONAA 60 (L) 05/21/2023 1113   GFRNONAA 67 03/24/2020 1122   GFRAA 77 03/24/2020 1122     Diabetic Labs (most recent): Lab Results  Component Value Date   HGBA1C 6.6 (A) 03/01/2023   HGBA1C 7.3 (A) 12/01/2022   HGBA1C 8.1 (A) 08/29/2022   MICROALBUR 292 12/10/2017   MICROALBUR 12 12/12/2016     Lipid Panel ( most recent) Lipid Panel     Component Value Date/Time   CHOL 159 03/18/2021 1039  TRIG 368.0 (H) 03/18/2021 1039   HDL 30.10 (L)  03/18/2021 5621   CHOLHDL 5 03/18/2021 1039   VLDL 73.6 (H) 03/18/2021 1039   LDLCALC 86 11/06/2019 0836   LDLDIRECT 91.0 03/18/2021 1039      Lab Results  Component Value Date   TSH 6.29 (H) 04/18/2021   TSH 5.27 (H) 03/18/2021   TSH 3.96 11/21/2019   TSH 4.00 03/17/2019   TSH 4.36 03/12/2018   TSH 2.93 03/12/2017   FREET4 0.58 (L) 04/18/2021      Assessment: 1. Hypercalcemia / Hyperparathyroidism  Plan: See notes from prior visits.  She returns with stable calcium at 10.8 along with PTH of 60.  Overall these numbers are improving from 11.4/78 respectively.    Patient is not a surgical  candidate.  She is advised to continue Sensipar 30 mg p.o. daily at breakfast.    - Patient also  has vitamin D deficiency on supplement-advised to continue vitamin D3 4000 units daily.  -PTH RP is normal, malignancy related hypercalcemia unlikely. -There seems to be a complication from hypercalcemia/hypocalcemia with 3 mm nonobstructive right sided nephrolithiasis.  no history of   osteoporosis,fragility fractures. No abdominal pain, no major mood disorders, no bone pain-she is encouraged to obtain her bone density.  Her most recent DXA showed osteopenia, will not need bisphosphonates.  -She is not a surgical candidate for hyperparathyroidism and wishes to avoid surgery.     She has uncontrolled type 2 diabetes, recent A1c of 6.6%, addressing with her PMD.  She will undergoing a process for lumpectomy for interval diagnosis of breast cancer in a week.    She is advised to maintain close follow-up with her PCP.   I spent  19  minutes in the care of the patient today including review of labs from Thyroid Function, CMP, and other relevant labs ; imaging/biopsy records (current and previous including abstractions from other facilities); face-to-face time discussing  her lab results and symptoms, medications doses, her options of short and long term treatment based on the latest standards of  care / guidelines;   and documenting the encounter.  Delmar Landau Coey  participated in the discussions, expressed understanding, and voiced agreement with the above plans.  All questions were answered to her satisfaction. she is encouraged to contact clinic should she have any questions or concerns prior to her return visit.    - Return in about 6 months (around 12/01/2023) for F/U with Pre-visit Labs.   Marquis Lunch, MD [facility] [address] [place], [place] [address] Phone: [phone]  Fax: [phone]    This note was partially dictated with voice recognition software. Similar sounding words can be transcribed inadequately or may not  be corrected upon review.  05/31/2023, 2:26 PM

## 2023-06-01 ENCOUNTER — Encounter (HOSPITAL_BASED_OUTPATIENT_CLINIC_OR_DEPARTMENT_OTHER)
Admission: RE | Admit: 2023-06-01 | Discharge: 2023-06-01 | Disposition: A | Discharge status: Home / Self Care | Payer: PPO | Source: Ambulatory Visit | Attending: Surgery | Admitting: Surgery

## 2023-06-01 DIAGNOSIS — E1169 Type 2 diabetes mellitus with other specified complication: Secondary | ICD-10-CM | POA: Diagnosis not present

## 2023-06-01 DIAGNOSIS — I452 Bifascicular block: Secondary | ICD-10-CM | POA: Diagnosis not present

## 2023-06-01 DIAGNOSIS — I10 Essential (primary) hypertension: Secondary | ICD-10-CM | POA: Diagnosis not present

## 2023-06-01 DIAGNOSIS — Z01810 Encounter for preprocedural cardiovascular examination: Secondary | ICD-10-CM | POA: Insufficient documentation

## 2023-06-01 MED ORDER — CHLORHEXIDINE GLUCONATE CLOTH 2 % EX PADS: 6.0000 | MEDICATED_PAD | Freq: Once | CUTANEOUS | Status: DC

## 2023-06-01 NOTE — Progress Notes (Signed)

## 2023-06-01 NOTE — Progress Notes (Signed)
EKG reviewed by Dr. Arby Barrette, will proceed with surgery as planned.

## 2023-06-05 ENCOUNTER — Ambulatory Visit
Admission: RE | Admit: 2023-06-05 | Discharge: 2023-06-05 | Disposition: A | Discharge status: Home / Self Care | Payer: PPO | Source: Ambulatory Visit | Attending: Surgery | Admitting: Surgery

## 2023-06-05 DIAGNOSIS — D0511 Intraductal carcinoma in situ of right breast: Secondary | ICD-10-CM | POA: Diagnosis not present

## 2023-06-05 HISTORY — PX: BREAST BIOPSY: SHX20

## 2023-06-06 ENCOUNTER — Ambulatory Visit
Admission: RE | Admit: 2023-06-06 | Discharge: 2023-06-06 | Disposition: A | Discharge status: Home / Self Care | Payer: PPO | Source: Ambulatory Visit | Attending: Surgery | Admitting: Surgery

## 2023-06-06 ENCOUNTER — Ambulatory Visit (HOSPITAL_BASED_OUTPATIENT_CLINIC_OR_DEPARTMENT_OTHER): Payer: Self-pay | Admitting: Certified Registered"

## 2023-06-06 ENCOUNTER — Ambulatory Visit (HOSPITAL_BASED_OUTPATIENT_CLINIC_OR_DEPARTMENT_OTHER): Payer: PPO | Admitting: Certified Registered"

## 2023-06-06 ENCOUNTER — Encounter: Payer: Self-pay | Admitting: *Deleted

## 2023-06-06 ENCOUNTER — Encounter (HOSPITAL_BASED_OUTPATIENT_CLINIC_OR_DEPARTMENT_OTHER): Payer: Self-pay | Admitting: Surgery

## 2023-06-06 ENCOUNTER — Other Ambulatory Visit: Payer: Self-pay

## 2023-06-06 ENCOUNTER — Encounter (HOSPITAL_BASED_OUTPATIENT_CLINIC_OR_DEPARTMENT_OTHER)
Admission: RE | Disposition: A | Discharge status: Home / Self Care | Payer: Self-pay | Source: Home / Self Care | Attending: Surgery

## 2023-06-06 ENCOUNTER — Ambulatory Visit (HOSPITAL_BASED_OUTPATIENT_CLINIC_OR_DEPARTMENT_OTHER)
Admission: RE | Admit: 2023-06-06 | Discharge: 2023-06-06 | Disposition: A | Discharge status: Home / Self Care | Payer: PPO | Attending: Surgery | Admitting: Surgery

## 2023-06-06 DIAGNOSIS — D0511 Intraductal carcinoma in situ of right breast: Secondary | ICD-10-CM

## 2023-06-06 DIAGNOSIS — E785 Hyperlipidemia, unspecified: Secondary | ICD-10-CM | POA: Diagnosis not present

## 2023-06-06 DIAGNOSIS — E1159 Type 2 diabetes mellitus with other circulatory complications: Secondary | ICD-10-CM

## 2023-06-06 DIAGNOSIS — E119 Type 2 diabetes mellitus without complications: Secondary | ICD-10-CM

## 2023-06-06 DIAGNOSIS — Z87891 Personal history of nicotine dependence: Secondary | ICD-10-CM | POA: Diagnosis not present

## 2023-06-06 DIAGNOSIS — I10 Essential (primary) hypertension: Secondary | ICD-10-CM | POA: Diagnosis not present

## 2023-06-06 DIAGNOSIS — Z170 Estrogen receptor positive status [ER+]: Secondary | ICD-10-CM | POA: Diagnosis not present

## 2023-06-06 DIAGNOSIS — Z7984 Long term (current) use of oral hypoglycemic drugs: Secondary | ICD-10-CM

## 2023-06-06 HISTORY — PX: BREAST LUMPECTOMY WITH RADIOACTIVE SEED LOCALIZATION: SHX6424

## 2023-06-06 HISTORY — DX: Calculus of kidney: N20.0

## 2023-06-06 LAB — GLUCOSE, CAPILLARY
Glucose-Capillary: 134 mg/dL — ABNORMAL HIGH (ref 70–99)
Glucose-Capillary: 148 mg/dL — ABNORMAL HIGH (ref 70–99)

## 2023-06-06 SURGERY — BREAST LUMPECTOMY WITH RADIOACTIVE SEED LOCALIZATION
Anesthesia: General | Site: Breast | Laterality: Right

## 2023-06-06 MED ORDER — PROPOFOL 10 MG/ML IV BOLUS
INTRAVENOUS | Status: DC | PRN
  Administered 2023-06-06: 150 mg via INTRAVENOUS
  Administered 2023-06-06: 50 mg via INTRAVENOUS

## 2023-06-06 MED ORDER — DEXAMETHASONE SODIUM PHOSPHATE 10 MG/ML IJ SOLN
INTRAMUSCULAR | Status: AC
  Filled 2023-06-06: qty 1

## 2023-06-06 MED ORDER — DEXAMETHASONE SODIUM PHOSPHATE 10 MG/ML IJ SOLN
INTRAMUSCULAR | Status: DC | PRN
  Administered 2023-06-06: 4 mg via INTRAVENOUS

## 2023-06-06 MED ORDER — ONDANSETRON HCL 4 MG/2ML IJ SOLN: 4.0000 mg | Freq: Once | INTRAMUSCULAR | Status: DC | PRN

## 2023-06-06 MED ORDER — ONDANSETRON HCL 4 MG/2ML IJ SOLN: INTRAMUSCULAR | Status: DC | PRN

## 2023-06-06 MED ORDER — FENTANYL CITRATE (PF) 100 MCG/2ML IJ SOLN
25.0000 ug | INTRAMUSCULAR | Status: DC | PRN
  Administered 2023-06-06: 25 ug via INTRAVENOUS

## 2023-06-06 MED ORDER — AMISULPRIDE (ANTIEMETIC) 5 MG/2ML IV SOLN: 10.0000 mg | Freq: Once | INTRAVENOUS | Status: DC | PRN

## 2023-06-06 MED ORDER — EPHEDRINE 5 MG/ML INJ
INTRAVENOUS | Status: AC
  Filled 2023-06-06: qty 5

## 2023-06-06 MED ORDER — CEFAZOLIN SODIUM-DEXTROSE 2-4 GM/100ML-% IV SOLN
INTRAVENOUS | Status: AC
  Filled 2023-06-06: qty 100

## 2023-06-06 MED ORDER — PROPOFOL 10 MG/ML IV BOLUS
INTRAVENOUS | Status: AC
  Filled 2023-06-06: qty 20

## 2023-06-06 MED ORDER — LIDOCAINE HCL (CARDIAC) PF 100 MG/5ML IV SOSY
PREFILLED_SYRINGE | INTRAVENOUS | Status: DC | PRN
  Administered 2023-06-06: 60 mg via INTRAVENOUS

## 2023-06-06 MED ORDER — ACETAMINOPHEN 500 MG PO TABS
1000.0000 mg | ORAL_TABLET | ORAL | Status: AC
  Administered 2023-06-06: 1000 mg via ORAL

## 2023-06-06 MED ORDER — ONDANSETRON HCL 4 MG/2ML IJ SOLN
INTRAMUSCULAR | Status: DC | PRN
  Administered 2023-06-06: 4 mg via INTRAVENOUS

## 2023-06-06 MED ORDER — LIDOCAINE 2% (20 MG/ML) 5 ML SYRINGE
INTRAMUSCULAR | Status: AC
  Filled 2023-06-06: qty 5

## 2023-06-06 MED ORDER — EPHEDRINE SULFATE (PRESSORS) 50 MG/ML IJ SOLN
INTRAMUSCULAR | Status: DC | PRN
  Administered 2023-06-06: 5 mg via INTRAVENOUS

## 2023-06-06 MED ORDER — FENTANYL CITRATE (PF) 100 MCG/2ML IJ SOLN
INTRAMUSCULAR | Status: AC
  Filled 2023-06-06: qty 2

## 2023-06-06 MED ORDER — CEFAZOLIN SODIUM-DEXTROSE 2-4 GM/100ML-% IV SOLN
2.0000 g | INTRAVENOUS | Status: AC
  Administered 2023-06-06: 2 g via INTRAVENOUS

## 2023-06-06 MED ORDER — FENTANYL CITRATE (PF) 100 MCG/2ML IJ SOLN
INTRAMUSCULAR | Status: DC | PRN
  Administered 2023-06-06: 25 ug via INTRAVENOUS

## 2023-06-06 MED ORDER — ACETAMINOPHEN 500 MG PO TABS
ORAL_TABLET | ORAL | Status: AC
  Filled 2023-06-06: qty 2

## 2023-06-06 MED ORDER — LACTATED RINGERS IV SOLN: INTRAVENOUS | Status: DC

## 2023-06-06 MED ORDER — ONDANSETRON HCL 4 MG/2ML IJ SOLN
INTRAMUSCULAR | Status: AC
  Filled 2023-06-06: qty 2

## 2023-06-06 MED ORDER — BUPIVACAINE-EPINEPHRINE 0.25% -1:200000 IJ SOLN
INTRAMUSCULAR | Status: DC | PRN
  Administered 2023-06-06: 10 mL

## 2023-06-06 MED ORDER — BUPIVACAINE-EPINEPHRINE (PF) 0.25% -1:200000 IJ SOLN
INTRAMUSCULAR | Status: AC
  Filled 2023-06-06: qty 90

## 2023-06-06 SURGICAL SUPPLY — 48 items
APL PRP STRL LF DISP 70% ISPRP (MISCELLANEOUS) ×1
APL SKNCLS STERI-STRIP NONHPOA (GAUZE/BANDAGES/DRESSINGS) ×1
APPLIER CLIP 9.375 MED OPEN (MISCELLANEOUS) ×1
APR CLP MED 9.3 20 MLT OPN (MISCELLANEOUS) ×1
BENZOIN TINCTURE PRP APPL 2/3 (GAUZE/BANDAGES/DRESSINGS) ×1 IMPLANT
BLADE HEX COATED 2.75 (ELECTRODE) ×1 IMPLANT
BLADE SURG 15 STRL LF DISP TIS (BLADE) ×1 IMPLANT
BLADE SURG 15 STRL SS (BLADE) ×1
CANISTER SUC SOCK COL 7IN (MISCELLANEOUS) IMPLANT
CANISTER SUCT 1200ML W/VALVE (MISCELLANEOUS) IMPLANT
CHLORAPREP W/TINT 26 (MISCELLANEOUS) ×1 IMPLANT
CLIP APPLIE 9.375 MED OPEN (MISCELLANEOUS) ×1 IMPLANT
COVER BACK TABLE 60X90IN (DRAPES) ×1 IMPLANT
COVER MAYO STAND STRL (DRAPES) ×1 IMPLANT
COVER PROBE CYLINDRICAL 5X96 (MISCELLANEOUS) ×1 IMPLANT
DRAPE LAPAROTOMY 100X72 PEDS (DRAPES) ×1 IMPLANT
DRAPE UTILITY XL STRL (DRAPES) ×1 IMPLANT
DRSG TEGADERM 4X4.75 (GAUZE/BANDAGES/DRESSINGS) ×1 IMPLANT
ELECT REM PT RETURN 9FT ADLT (ELECTROSURGICAL) ×1
ELECTRODE REM PT RTRN 9FT ADLT (ELECTROSURGICAL) ×1 IMPLANT
GAUZE SPONGE 2X2 STRL 8-PLY (GAUZE/BANDAGES/DRESSINGS) IMPLANT
GAUZE SPONGE 4X4 12PLY STRL LF (GAUZE/BANDAGES/DRESSINGS) IMPLANT
GLOVE BIO SURGEON STRL SZ 6.5 (GLOVE) IMPLANT
GLOVE BIO SURGEON STRL SZ7 (GLOVE) ×1 IMPLANT
GLOVE BIOGEL PI IND STRL 6.5 (GLOVE) IMPLANT
GLOVE BIOGEL PI IND STRL 7.5 (GLOVE) ×1 IMPLANT
GOWN STRL REUS W/ TWL LRG LVL3 (GOWN DISPOSABLE) ×2 IMPLANT
GOWN STRL REUS W/TWL LRG LVL3 (GOWN DISPOSABLE) ×4
KIT MARKER MARGIN INK (KITS) ×1 IMPLANT
NDL HYPO 25X1 1.5 SAFETY (NEEDLE) ×1 IMPLANT
NEEDLE HYPO 25X1 1.5 SAFETY (NEEDLE) ×1
NS IRRIG 1000ML POUR BTL (IV SOLUTION) ×1 IMPLANT
PACK BASIN DAY SURGERY FS (CUSTOM PROCEDURE TRAY) ×1 IMPLANT
PENCIL SMOKE EVACUATOR (MISCELLANEOUS) ×1 IMPLANT
SLEEVE SCD COMPRESS KNEE MED (STOCKING) ×1 IMPLANT
SPIKE FLUID TRANSFER (MISCELLANEOUS) IMPLANT
SPONGE T-LAP 4X18 ~~LOC~~+RFID (SPONGE) ×1 IMPLANT
STRIP CLOSURE SKIN 1/2X4 (GAUZE/BANDAGES/DRESSINGS) ×1 IMPLANT
SUT MON AB 4-0 PC3 18 (SUTURE) ×1 IMPLANT
SUT SILK 2 0 SH (SUTURE) IMPLANT
SUT VIC AB 3-0 SH 27 (SUTURE) ×1
SUT VIC AB 3-0 SH 27X BRD (SUTURE) ×1 IMPLANT
SYR BULB EAR ULCER 3OZ GRN STR (SYRINGE) IMPLANT
SYR CONTROL 10ML LL (SYRINGE) ×1 IMPLANT
TOWEL GREEN STERILE FF (TOWEL DISPOSABLE) ×1 IMPLANT
TRAY FAXITRON CT DISP (TRAY / TRAY PROCEDURE) ×1 IMPLANT
TUBE CONNECTING 20X1/4 (TUBING) IMPLANT
YANKAUER SUCT BULB TIP NO VENT (SUCTIONS) IMPLANT

## 2023-06-06 NOTE — Interval H&P Note (Signed)
History and Physical Interval Note:  06/06/2023 8:14 AM  [name]  has presented today for surgery, with the diagnosis of RIGHT BREAST DUCTAL CARCINOMA IN SITU.  The various methods of treatment have been discussed with the patient and family. After consideration of risks, benefits and other options for treatment, the patient has consented to  Procedure(s) with comments: RIGHT BREAST LUMPECTOMY WITH RADIOACTIVE SEED LOCALIZATION (Right) - LMA as a surgical intervention.  The patient's history has been reviewed, patient examined, no change in status, stable for surgery.  I have reviewed the patient's chart and labs.  Questions were answered to the patient's satisfaction.     [name]

## 2023-06-06 NOTE — Anesthesia Postprocedure Evaluation (Signed)
Anesthesia Post Note  Patient: Elizabet Macia Gangi  Procedure(s) Performed: RIGHT BREAST LUMPECTOMY WITH RADIOACTIVE SEED LOCALIZATION (Right: Breast)     Patient location during evaluation: PACU Anesthesia Type: General Level of consciousness: awake Pain management: pain level controlled Vital Signs Assessment: post-procedure vital signs reviewed and stable Respiratory status: spontaneous breathing, nonlabored ventilation and respiratory function stable Cardiovascular status: blood pressure returned to baseline and stable Postop Assessment: no apparent nausea or vomiting Anesthetic complications: no   No notable events documented.  Last Vitals:  Vitals:   06/06/23 1200 06/06/23 1247  BP: (!) 140/74 139/69  Pulse: (!) 45 (!) 52  Resp: 14 20  Temp:  (!) 36.2 C  SpO2: 94% 97%    Last Pain:  Vitals:   06/06/23 1247  TempSrc: Oral  PainSc: 0-No pain                  P 

## 2023-06-06 NOTE — Op Note (Signed)
Pre-op Diagnosis:  Ductal carcinoma in situ right breast  Post-op Diagnosis: same Procedure:  Right breast radioactive seed localized lumpectomy Surgeon:  , K. Anesthesia:  GEN - LMA Indications:  This is an 88 year old female who was previously treated for stage I invasive ductal carcinoma in 2007. She had a lumpectomy and radiation therapy followed by [facility] for 5 years. She has continued with annual mammograms. Recently she was found to have a 7 mm group of pleomorphic calcifications in the retroareolar right breast. She underwent stereotactic biopsy of this area that revealed ductal carcinoma in situ intermediate grade, ER/PR positive. She has already seen oncology but is referred to [name] for lumpectomy.  Radioactive seed was placed yesterday by Radiology.  Description of procedure: The patient is brought to the operating room placed in supine position on the operating room table. After an adequate level of general anesthesia was obtained, her right breast was prepped with ChloraPrep and draped in sterile fashion. A timeout was taken to ensure the proper patient and proper procedure. We interrogated the breast with the neoprobe. We made a circumareolar incision around the upper side of the nipple after infiltrating with 0.25% Marcaine. Dissection was carried down in the retroareolar breast tissue with cautery. We used the neoprobe to guide [name] towards the radioactive seed. We excised an area of tissue around the radioactive seed 2 cm in diameter. The specimen was removed and was oriented with a paint kit. Specimen mammogram showed the radioactive seed as well as the biopsy clip within the specimen. This was sent for pathologic examination. There is no residual radioactivity within the biopsy cavity. We inspected carefully for hemostasis. The wound was thoroughly irrigated. The wound was closed with a deep layer of 3-0 Vicryl and a subcuticular layer of 4-0 Monocryl. Benzoin Steri-Strips were  applied. The patient was then extubated and brought to the recovery room in stable condition. All sponge, instrument, and needle counts are correct.  Wilmon Arms. Corliss Skains, MD, [facility]  General/ Trauma Surgery  06/06/2023 10:43 AM

## 2023-06-06 NOTE — Progress Notes (Signed)
Patient had Right breast radioactive seed localized lumpectom today. Will follow for path.   Oncology Nurse Navigator Documentation     06/06/2023   11:30 AM  Oncology Nurse Navigator Flowsheets  Phase of Treatment Surgery  Surgery Actual Start Date: 06/06/2023  Navigator Follow Up Date: 06/11/2023  Navigator Follow Up Reason: Pathology  Navigator Location [facility]  Navigator Encounter Type Appt/Treatment Plan Review  Treatment Initiated Date 06/06/2023  Patient Visit Type MedOnc  Treatment Phase Active Tx  Barriers/Navigation Needs Coordination of Care;Education  Interventions None Required  Acuity Level 2-Minimal Needs (1-2 Barriers Identified)  Time Spent with Patient 15

## 2023-06-06 NOTE — Anesthesia Procedure Notes (Signed)
Procedure Name: LMA Insertion Date/Time: 06/06/2023 10:13 AM  Performed by: Lauralyn Primes, CRNAPre-anesthesia Checklist: Patient identified, Emergency Drugs available, Suction available and Patient being monitored Patient Re-evaluated:Patient Re-evaluated prior to induction Oxygen Delivery Method: Circle system utilized Preoxygenation: Pre-oxygenation with 100% oxygen Induction Type: IV induction Ventilation: Mask ventilation without difficulty LMA: LMA with gastric port inserted LMA Size: 4.0 Number of attempts: 1 Airway Equipment and Method: Bite block Placement Confirmation: positive ETCO2 Tube secured with: Tape Dental Injury: Teeth and Oropharynx as per pre-operative assessment

## 2023-06-06 NOTE — Discharge Instructions (Addendum)
Post Anesthesia Home Care Instructions  Activity: Get plenty of rest for the remainder of the day. A responsible individual must stay with you for 24 hours following the procedure.  For the next 24 hours, DO NOT: -Drive a car -Operate machinery -Drink alcoholic beverages -Take any medication unless instructed by your physician -Make any legal decisions or sign important papers.  Meals: Start with liquid foods such as gelatin or soup. Progress to regular foods as tolerated. Avoid greasy, spicy, heavy foods. If nausea and/or vomiting occur, drink only clear liquids until the nausea and/or vomiting subsides. Call your physician if vomiting continues.  Special Instructions/Symptoms: Your throat may feel dry or sore from the anesthesia or the breathing tube placed in your throat during surgery. If this causes discomfort, gargle with warm salt water. The discomfort should disappear within 24 hours.  If you had a scopolamine patch placed behind your ear for the management of post- operative nausea and/or vomiting:  1. The medication in the patch is effective for 72 hours, after which it should be removed.  Wrap patch in a tissue and discard in the trash. Wash hands thoroughly with soap and water. 2. You may remove the patch earlier than 72 hours if you experience unpleasant side effects which may include dry mouth, dizziness or visual disturbances. 3. Avoid touching the patch. Wash your hands with soap and water after contact with the patch.            [facility] Office Phone Number [phone]  BREAST BIOPSY/ PARTIAL MASTECTOMY: POST OP INSTRUCTIONS  Always review your discharge instruction sheet given to you by the facility where your surgery was performed.  IF YOU HAVE DISABILITY OR FAMILY LEAVE FORMS, YOU MUST BRING THEM TO THE OFFICE FOR PROCESSING.  DO NOT GIVE THEM TO YOUR DOCTOR.  A prescription for pain medication may be given to you upon discharge.  Take  your pain medication as prescribed, if needed.  If narcotic pain medicine is not needed, then you may take acetaminophen (Tylenol) or ibuprofen (Advil) as needed. Take your usually prescribed medications unless otherwise directed If you need a refill on your pain medication, please contact your pharmacy.  They will contact our office to request authorization.  Prescriptions will not be filled after 5pm or on week-ends. You should eat very light the first 24 hours after surgery, such as soup, crackers, pudding, etc.  Resume your normal diet the day after surgery. Most patients will experience some swelling and bruising in the breast.  Ice packs and a good support bra will help.  Swelling and bruising can take several days to resolve.  It is common to experience some constipation if taking pain medication after surgery.  Increasing fluid intake and taking a stool softener will usually help or prevent this problem from occurring.  A mild laxative (Milk of Magnesia or Miralax) should be taken according to package directions if there are no bowel movements after 48 hours. Unless discharge instructions indicate otherwise, you may remove your bandages 24-48 hours after surgery, and you may shower at that time.  You may have steri-strips (small skin tapes) in place directly over the incision.  These strips should be left on the skin for 7-10 days.  If your surgeon used skin glue on the incision, you may shower in 24 hours.  The glue will flake off over the next 2-3 weeks.  Any sutures or staples will be removed at the office during your follow-up visit. ACTIVITIES:    You may resume regular daily activities (gradually increasing) beginning the next day.  Wearing a good support bra or sports bra minimizes pain and swelling.  You may have sexual intercourse when it is comfortable. You may drive when you no longer are taking prescription pain medication, you can comfortably wear a seatbelt, and you can safely maneuver  your car and apply brakes. RETURN TO WORK:  ______________________________________________________________________________________ You should see your doctor in the office for a follow-up appointment approximately two weeks after your surgery.  Your doctor's nurse will typically make your follow-up appointment when she calls you with your pathology report.  Expect your pathology report 2-3 business days after your surgery.  You may call to check if you do not hear from us after three days. OTHER INSTRUCTIONS: _______________________________________________________________________________________________ _____________________________________________________________________________________________________________________________________ _____________________________________________________________________________________________________________________________________ _____________________________________________________________________________________________________________________________________  WHEN TO CALL YOUR DOCTOR: Fever over 101.0 Nausea and/or vomiting. Extreme swelling or bruising. Continued bleeding from incision. Increased pain, redness, or drainage from the incision.  The clinic staff is available to answer your questions during regular business hours.  Please don't hesitate to call and ask to speak to one of the nurses for clinical concerns.  If you have a medical emergency, go to the nearest emergency room or call 911.  A surgeon from [facility] is always on call at the hospital.  For further questions, please visit [email or web address]   

## 2023-06-06 NOTE — Transfer of Care (Signed)
Immediate Anesthesia Transfer of Care Note  Patient: [name]  Procedure(s) Performed: RIGHT BREAST LUMPECTOMY WITH RADIOACTIVE SEED LOCALIZATION (Right: Breast)  Patient Location: PACU  Anesthesia Type:General  Level of Consciousness: drowsy  Airway & Oxygen Therapy: Patient Spontanous Breathing and Patient connected to face mask oxygen  Post-op Assessment: Report given to RN and Post -op Vital signs reviewed and stable  Post vital signs: Reviewed and stable  Last Vitals:  Vitals Value Taken Time  BP 154/61 (89)   Temp    Pulse 58 06/06/23 1051  Resp    SpO2 100 % 06/06/23 1051  Vitals shown include unfiled device data.  Last Pain:  Vitals:   06/06/23 0817  TempSrc: Tympanic  PainSc: 0-No pain      Patients Stated Pain Goal: 6 (06/06/23 0817)  Complications: No notable events documented.

## 2023-06-06 NOTE — Anesthesia Preprocedure Evaluation (Addendum)
Anesthesia Evaluation  Patient identified by MRN, date of birth, ID band Patient awake    Reviewed: Allergy & Precautions, NPO status , Patient's Chart, lab work & pertinent test results  Airway Mallampati: II  TM Distance: >3 FB Neck ROM: Full    Dental  (+) Upper Dentures, Lower Dentures   Pulmonary former smoker   Pulmonary exam normal        Cardiovascular hypertension, Pt. 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  Reproductive/Obstetrics                             Anesthesia Physical Anesthesia Plan  ASA: 3  Anesthesia Plan: General   Post-op Pain Management:    Induction: Intravenous  PONV Risk Score and Plan: 3 and Ondansetron, Dexamethasone and Treatment may vary due to age or medical condition  Airway Management Planned: LMA  Additional Equipment:   Intra-op Plan:   Post-operative Plan: Extubation in OR  Informed Consent: I have reviewed the patients History and Physical, chart, labs and discussed the procedure including the risks, benefits and alternatives for the proposed anesthesia with the patient or authorized representative who has indicated his/her understanding and acceptance.     Dental advisory given  Plan Discussed with: CRNA  Anesthesia Plan Comments:        Anesthesia Quick Evaluation

## 2023-06-07 ENCOUNTER — Encounter (HOSPITAL_BASED_OUTPATIENT_CLINIC_OR_DEPARTMENT_OTHER): Payer: Self-pay | Admitting: Surgery

## 2023-06-07 ENCOUNTER — Ambulatory Visit: Payer: PPO | Admitting: Family Medicine

## 2023-06-08 ENCOUNTER — Encounter: Payer: Self-pay | Admitting: *Deleted

## 2023-06-08 NOTE — Progress Notes (Signed)
Reviewed surgical pathology which confirms DCIS. Patient is already scheduled for follow up appointment.   Oncology Nurse Navigator Documentation     06/08/2023    2:00 PM  Oncology Nurse Navigator Flowsheets  Navigator Follow Up Date: 07/11/2023  Navigator Follow Up Reason: Follow-up Appointment  Navigator Location [facility]  Navigator Encounter Type Pathology Review  Patient Visit Type MedOnc  Treatment Phase Active Tx  Barriers/Navigation Needs Coordination of Care;Education  Interventions None Required  Acuity Level 2-Minimal Needs (1-2 Barriers Identified)  Time Spent with Patient 15

## 2023-06-14 ENCOUNTER — Encounter (INDEPENDENT_AMBULATORY_CARE_PROVIDER_SITE_OTHER): Payer: Self-pay

## 2023-06-14 ENCOUNTER — Ambulatory Visit: Payer: PPO | Admitting: Family Medicine

## 2023-06-18 ENCOUNTER — Ambulatory Visit (INDEPENDENT_AMBULATORY_CARE_PROVIDER_SITE_OTHER): Payer: PPO | Admitting: Family Medicine

## 2023-06-18 ENCOUNTER — Encounter: Payer: Self-pay | Admitting: Family Medicine

## 2023-06-18 VITALS — BP 117/63 | HR 62 | Temp 97.8°F | Ht 60.0 in | Wt 135.8 lb

## 2023-06-18 DIAGNOSIS — D0511 Intraductal carcinoma in situ of right breast: Secondary | ICD-10-CM | POA: Diagnosis not present

## 2023-06-18 DIAGNOSIS — E1159 Type 2 diabetes mellitus with other circulatory complications: Secondary | ICD-10-CM | POA: Diagnosis not present

## 2023-06-18 DIAGNOSIS — I152 Hypertension secondary to endocrine disorders: Secondary | ICD-10-CM | POA: Diagnosis not present

## 2023-06-18 DIAGNOSIS — Z7985 Long-term (current) use of injectable non-insulin antidiabetic drugs: Secondary | ICD-10-CM | POA: Diagnosis not present

## 2023-06-18 DIAGNOSIS — E119 Type 2 diabetes mellitus without complications: Secondary | ICD-10-CM

## 2023-06-18 LAB — POCT GLYCOSYLATED HEMOGLOBIN (HGB A1C): Hemoglobin A1C: 6.1 % — AB (ref 4.0–5.6)

## 2023-06-18 NOTE — Assessment & Plan Note (Signed)
Recently had lumpectomy with negative margins.  Has upcoming appointments with both surgery and oncology.

## 2023-06-18 NOTE — Patient Instructions (Signed)
It was very nice to see you today!  I am glad that you are doing well.  Your A1c today is 6.1.  Please stop the glimepramide.   We will see you back in 3 months. Come back sooner if needed.   No follow-ups on file.   Take care, Dr [name]  PLEASE NOTE:  If you had any lab tests, please let us know if you have not heard back within a few days. You may see your results on mychart before we have a chance to review them but we will give you a call once they are reviewed by [name].   If we ordered any referrals today, please let us know if you have not heard from their office within the next week.   If you had any urgent prescriptions sent in today, please check with the pharmacy within an hour of our visit to make sure the prescription was transmitted appropriately.   Please try these tips to maintain a healthy lifestyle:  Eat at least 3 REAL meals and 1-2 snacks per day.  Aim for no more than 5 hours between eating.  If you eat breakfast, please do so within one hour of getting up.   Each meal should contain half fruits/vegetables, one quarter protein, and one quarter carbs (no bigger than a computer mouse)  Cut down on sweet beverages. This includes juice, soda, and sweet tea.   Drink at least 1 glass of water with each meal and aim for at least 8 glasses per day  Exercise at least 150 minutes every week.

## 2023-06-18 NOTE — Assessment & Plan Note (Signed)
A1c very well-controlled 6.1.  Stop glimepiride.  Continue Ozempic 1 mg weekly.  Recheck 3 months.

## 2023-06-18 NOTE — Progress Notes (Signed)
   [name] is a 88 y.o. female who presents today for an office visit.  Assessment/Plan:  New/Acute Problems: Cerumen Impaction  Successfully irrigated by RMA today.  She can use over-the-counter Debrox as needed to prevent buildup.  Chronic Problems Addressed Today: Diabetes mellitus without complication (HCC) A1c very well-controlled 6.1.  Stop glimepiride.  Continue Ozempic 1 mg weekly.  Recheck 3 months.  Hypertension associated with diabetes (HCC) At goal.  Continue losartan 50 mg daily.  Ductal carcinoma in situ (DCIS) of right breast Recently had lumpectomy with negative margins.  Has upcoming appointments with both surgery and oncology.     Subjective:  HPI:  See Assessment / plan for status of chronic conditions. She is here today for her 3 month follow up. Since our last visit she was diagnosed with DCIS and she underwent lumpectomy a couple of weeks ago.  Margins were negative. She does have appointments upcoming with surgery and oncology.   She would also like for [name] to flush her ears out today. She has noticed decreased hearing recently.        Objective:  Physical Exam: BP 117/63   Pulse 62   Temp 97.8 F (36.6 C) (Temporal)   Ht 5' (1.524 m)   Wt 135 lb 12.8 oz (61.6 kg)   SpO2 99%   BMI 26.52 kg/m   Gen: No acute distress, resting comfortably HEENT: Bilateral EAC with cerumen. CV: Regular rate and rhythm with no murmurs appreciated Pulm: Normal work of breathing, clear to auscultation bilaterally with no crackles, wheezes, or rhonchi Neuro: Grossly normal, moves all extremities Psych: Normal affect and thought content      [name] [name], MD 06/18/2023 11:58 AM

## 2023-06-18 NOTE — Assessment & Plan Note (Signed)
At goal.  Continue losartan 50 mg daily. 

## 2023-06-19 ENCOUNTER — Encounter: Payer: Self-pay | Admitting: Pharmacist

## 2023-06-19 NOTE — Progress Notes (Signed)
Pharmacy Quality Measure Review  This patient is appearing on a report for being at risk of failing the adherence measure for cholesterol (statin), diabetes, and hypertension (ACEi/ARB) medications this calendar year.   Medication: rosuvastatin Last fill date: 02/19/2023 for 90 day supply Reviewed Dr Tiajuana Amass database - patient filled 30 DS on 7/23 and 8/15  Medication: Losartan  Last fill date: 05/17/2023 for 90 day supply  Medication: glimepiride Last fill date: 05/17/2023 for 90 day supply Glimepiride was actually stopped yesterday by PCP because A1c had improved to 6.1%  Also noted that patient has Ozempic on her med list - Last refill was 05/02/2023 for 28 DS.  Attempted to contact patient to discuss Ozempic and to see if she had reached coverage gap.  Unable to reach patient. LM on VM with CB# [phone].  Henrene Pastor, PharmD Clinical Pharmacist [facility]  Population Health [phone]

## 2023-07-11 ENCOUNTER — Inpatient Hospital Stay: Payer: PPO | Admitting: Hematology & Oncology

## 2023-07-11 ENCOUNTER — Encounter: Payer: Self-pay | Admitting: Hematology & Oncology

## 2023-07-11 ENCOUNTER — Inpatient Hospital Stay: Payer: PPO | Attending: Hematology & Oncology

## 2023-07-11 VITALS — BP 137/58 | HR 74 | Temp 98.7°F | Resp 20 | Ht 62.0 in | Wt 126.1 lb

## 2023-07-11 DIAGNOSIS — C50012 Malignant neoplasm of nipple and areola, left female breast: Secondary | ICD-10-CM | POA: Diagnosis not present

## 2023-07-11 DIAGNOSIS — Z79899 Other long term (current) drug therapy: Secondary | ICD-10-CM | POA: Insufficient documentation

## 2023-07-11 DIAGNOSIS — D0511 Intraductal carcinoma in situ of right breast: Secondary | ICD-10-CM | POA: Insufficient documentation

## 2023-07-11 DIAGNOSIS — Z170 Estrogen receptor positive status [ER+]: Secondary | ICD-10-CM

## 2023-07-11 DIAGNOSIS — C50011 Malignant neoplasm of nipple and areola, right female breast: Secondary | ICD-10-CM | POA: Diagnosis not present

## 2023-07-11 LAB — CBC WITH DIFFERENTIAL (CANCER CENTER ONLY)
Abs Immature Granulocytes: 0.01 10*3/uL (ref 0.00–0.07)
Basophils Absolute: 0 10*3/uL (ref 0.0–0.1)
Basophils Relative: 1 %
Eosinophils Absolute: 0.1 10*3/uL (ref 0.0–0.5)
Eosinophils Relative: 1 %
HCT: 43.4 % (ref 36.0–46.0)
Hemoglobin: 14.7 g/dL (ref 12.0–15.0)
Immature Granulocytes: 0 %
Lymphocytes Relative: 42 %
Lymphs Abs: 2.9 10*3/uL (ref 0.7–4.0)
MCH: 30.2 pg (ref 26.0–34.0)
MCHC: 33.9 g/dL (ref 30.0–36.0)
MCV: 89.3 fL (ref 80.0–100.0)
Monocytes Absolute: 0.5 10*3/uL (ref 0.1–1.0)
Monocytes Relative: 7 %
Neutro Abs: 3.4 10*3/uL (ref 1.7–7.7)
Neutrophils Relative %: 49 %
Platelet Count: 229 10*3/uL (ref 150–400)
RBC: 4.86 MIL/uL (ref 3.87–5.11)
RDW: 12.9 % (ref 11.5–15.5)
WBC Count: 7 10*3/uL (ref 4.0–10.5)
nRBC: 0 % (ref 0.0–0.2)

## 2023-07-11 LAB — CMP (CANCER CENTER ONLY)
ALT: 19 U/L (ref 0–44)
AST: 20 U/L (ref 15–41)
Albumin: 4.3 g/dL (ref 3.5–5.0)
Alkaline Phosphatase: 79 U/L (ref 38–126)
Anion gap: 8 (ref 5–15)
BUN: 9 mg/dL (ref 8–23)
CO2: 32 mmol/L (ref 22–32)
Calcium: 10.2 mg/dL (ref 8.9–10.3)
Chloride: 102 mmol/L (ref 98–111)
Creatinine: 0.95 mg/dL (ref 0.44–1.00)
GFR, Estimated: 58 mL/min — ABNORMAL LOW (ref >60)
Glucose, Bld: 180 mg/dL — ABNORMAL HIGH (ref 70–99)
Potassium: 4.4 mmol/L (ref 3.5–5.1)
Sodium: 142 mmol/L (ref 135–145)
Total Bilirubin: 0.7 mg/dL (ref 0.3–1.2)
Total Protein: 7.2 g/dL (ref 6.5–8.1)

## 2023-07-11 NOTE — Progress Notes (Signed)
Hematology and Oncology Follow Up Visit  [name] [id number] [birth year] 88 y.o. 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  She now has the DCIS.  When we saw her back in July, she was recently diagnosed.  She subsequently underwent a lumpectomy on 06/06/2023.  The pathology report ([id number]) showed a 1 cm DCIS.  It was nuclear grade intermediate.  Margins were all negative.  At her age, I really do not think she needs to have any kind of adjuvant therapy.  I do still think this is really going to be helpful for her.  She clearly does not need any radiation therapy..  She feels well.  She is doing well.  She has had no problems with nausea or vomiting.  There has been no problems with pain.  She has had no cough or shortness of breath.  There is been no change in bowel or bladder habits.  Currently, I would say performance status is probably ECOG 2.  Medications:  Current Outpatient Medications:    blood glucose meter kit and supplies KIT, Dispense based on patient and insurance preference. Use up to four times daily as directed. (FOR ICD-9 250.00, 250.01)., Disp: 1 each, Rfl: 0   Cholecalciferol (VITAMIN D-3) 1000 UNITS CAPS, Take 2,000 Units by mouth 2 (two) times daily., Disp: , Rfl:    cinacalcet (SENSIPAR) 30 MG tablet, Take 1 tablet (30 mg total) by mouth daily with breakfast., Disp: 90 tablet, Rfl: 1   glucose blood (ONETOUCH VERIO) test strip, USE TO TEST BLOOD SUGAR FOUR TIMES DAILY., Disp: 100 strip, Rfl: 3   losartan (COZAAR) 50 MG tablet, TAKE 1 TABLET BY MOUTH EVERY DAY, Disp: 90 tablet, Rfl: 1   Multiple Vitamins-Minerals (CENTRUM SILVER 50+WOMEN PO), Take 1 tablet by mouth daily., Disp: , Rfl:    omeprazole (PRILOSEC) 20 MG capsule, TAKE 1 CAPSULE(20 MG) BY MOUTH DAILY, Disp:  90 capsule, Rfl: 3   OZEMPIC, 1 MG/DOSE, 4 MG/3ML SOPN, INJECT 1MG  INTO THE SKIN ONCE WEEKLY, Disp: 3 mL, Rfl: 3   rosuvastatin (CRESTOR) 10 MG tablet, TAKE 1 TABLET(10 MG) BY MOUTH DAILY, Disp: 30 tablet, Rfl: 2   sodium chloride (MURO 128) 2 % ophthalmic solution, Place 1 drop into both eyes 2 (two) times daily., Disp: , Rfl:   Allergies: No Known Allergies  Past Medical History, Surgical history, Social history, and Family History were reviewed and updated.  Review of Systems: Review of Systems  Constitutional: Negative.   HENT:  Negative.    Eyes: Negative.   Respiratory: Negative.    Cardiovascular: Negative.   Gastrointestinal: Negative.   Endocrine: Negative.   Genitourinary: Negative.    Musculoskeletal: Negative.   Skin: Negative.   Neurological: Negative.   Hematological: Negative.   Psychiatric/Behavioral: Negative.      Physical Exam:  height is 5\' 2"  (1.575 m) and weight is 126 lb 1.9 oz (57.2 kg). Her oral temperature is 98.7 F (37.1 C). Her blood pressure is 137/58 (abnormal) and her pulse is 74. Her respiration is 20 and oxygen saturation is 100%.   Wt Readings from Last 3 Encounters:  07/11/23 126 lb 1.9 oz (57.2 kg)  06/18/23 135 lb 12.8 oz (61.6 kg)  06/06/23 136 lb 3.9 oz (61.8 kg)    Physical Exam Vitals reviewed.  Constitutional:      Comments: Her right breast shows the lumpectomy scar at the edge of the areola.  This is about the 11 o'clock position.  It is well-healed.  There is no mass in the right breast.  There is no nipple discharge on the right side.  There is no right axillary adenopathy.  Left breast shows a well-healed lumpectomy at about the 7 o'clock position.  There is no mass in the left breast.  There is no left axillary adenopathy.  HENT:     Head: Normocephalic and atraumatic.  Eyes:     Pupils: Pupils are equal, round, and reactive to light.  Cardiovascular:     Rate and Rhythm: Normal rate and regular rhythm.     Heart sounds:  Normal heart sounds.  Pulmonary:     Effort: Pulmonary effort is normal.     Breath sounds: Normal breath sounds.  Abdominal:     General: Bowel sounds are normal.     Palpations: Abdomen is soft.  Musculoskeletal:        General: No tenderness or deformity. Normal range of motion.     Cervical back: Normal range of motion.  Lymphadenopathy:     Cervical: No cervical adenopathy.  Skin:    General: Skin is warm and dry.     Findings: No erythema or rash.  Neurological:     Mental Status: She is alert and oriented to person, place, and time.  Psychiatric:        Behavior: Behavior normal.        Thought Content: Thought content normal.        Judgment: Judgment normal.      Lab Results  Component Value Date   WBC 7.0 07/11/2023   HGB 14.7 07/11/2023   HCT 43.4 07/11/2023   MCV 89.3 07/11/2023   PLT 229 07/11/2023     Chemistry      Component Value Date/Time   NA 142 07/11/2023 1441   NA 143 05/28/2023 0947   NA 145 02/15/2017 1333   NA 144 08/12/2015 1419   K 4.4 07/11/2023 1441   K 4.4 02/15/2017 1333   K 4.2 08/12/2015 1419   CL 102 07/11/2023 1441   CL 104 02/15/2017 1333   CO2 32 07/11/2023 1441   CO2 28 02/15/2017 1333   CO2 29 08/12/2015 1419   BUN 9 07/11/2023 1441   BUN 6 (L) 05/28/2023 0947   BUN 13 02/15/2017 1333   BUN 11.8 08/12/2015 1419   CREATININE 0.95 07/11/2023 1441   CREATININE 0.82 03/24/2020 1122   CREATININE 0.8 08/12/2015 1419   GLU 197 09/18/2018 0000      Component Value Date/Time   CALCIUM 10.2 07/11/2023 1441   CALCIUM 10.2 02/15/2017 1333   CALCIUM 11.1 (H) 08/12/2015 1419   ALKPHOS 79 07/11/2023 1441   ALKPHOS 76 02/15/2017 1333   ALKPHOS 74 08/12/2015 1419   AST 20 07/11/2023 1441   AST 26 08/12/2015 1419   ALT 19 07/11/2023 1441   ALT 39 02/15/2017 1333   ALT 32 08/12/2015 1419   BILITOT 0.7 07/11/2023 1441   BILITOT 0.52 08/12/2015 1419       Impression and Plan: [name] is a very charming 88 year old  postmenopausal white female.  She now has a DCIS of the right breast.  Thankfully, given her age, I really do not see that we have to put her on any aromatase inhibitor therapy.  She clearly does not need radiation  therapy.  At this point, we can just follow her along.  We will plan to get her back in 3 months.  I am just happy that everything turned out so well.  I know that she has an incredible faith.  We share to our faith.  We had a good fellowship today.   Josph Macho, MD 9/11/20244:02 PM

## 2023-07-11 NOTE — Progress Notes (Deleted)
Referral MD  Reason for Referral: DCIS of the right breast-ER/PR positive  No chief complaint on file. : I have breast cancer again.  HPI: [name] is very well-known to [name].  She is a very charming 88 year old white female.  We have seen her in the past for a stage I invasive ductal carcinoma of the left breast.  She was diagnosed back in 2007.  She had surgery and radiation therapy.  She was then on Femara for 5 years.  She completed this back in June 2013.  We last saw her back in 2018.  She has been quite busy.  She has had 2 husband both died.  1 died of cancer 1 died after breaking his hip.  She still gets mammograms.  She had a mammogram recently which showed a suspicious area of calcification that measures 7 mm in the right breast.  She subsequently had a biopsy.  This was done on 05/08/2023.  The pathology report ([id number]) showed DCIS that was intermediate grade.  There is some necrosis.  The DCIS measures 4.5 mm.  The DCIS was ER positive/PR positive.  She is subsequently was sent to the [facility] for evaluation.  She sees Dr. Corliss Skains of [facility] surgery this week.  She feels well.  She looks great.  She has no complaints.  She has had no bony pain.  She has had no issues with COVID.  She has had no change in bowel or bladder habits.  She has had no rashes.  There is been no bleeding.  Overall, I would say her performance status is probably ECOG 1.   Past Medical History:  Diagnosis Date  . Arthritis   . Cancer (HCC)   . Diabetes mellitus without complication (HCC)   . GERD (gastroesophageal reflux disease)   . Hyperlipidemia   . Hypertension   . Kidney stones   . Thyroid disease   :   Past Surgical History:  Procedure Laterality Date  . BACK SURGERY    . BREAST BIOPSY  06/05/2023   MM RT RADIOACTIVE SEED LOC MAMMO GUIDE 06/05/2023 [facility] MAMMOGRAP[facility]  . BREAST LUMPECTOMY WITH RADIOACTIVE SEED LOCALIZATION Right 06/06/2023   Procedure:  RIGHT BREAST LUMPECTOMY WITH RADIOACTIVE SEED LOCALIZATION;  Surgeon: Manus Rudd, MD;  Location: [facility];  Service: General;  Laterality: Right;  LMA  . BREAST SURGERY    . MELANOMA EXCISION    . TONSILLECTOMY    :   Current Outpatient Medications:  .  blood glucose meter kit and supplies KIT, Dispense based on patient and insurance preference. Use up to four times daily as directed. (FOR ICD-9 250.00, 250.01)., Disp: 1 each, Rfl: 0 .  Cholecalciferol (VITAMIN D-3) 1000 UNITS CAPS, Take 2,000 Units by mouth 2 (two) times daily., Disp: , Rfl:  .  cinacalcet (SENSIPAR) 30 MG tablet, Take 1 tablet (30 mg total) by mouth daily with breakfast., Disp: 90 tablet, Rfl: 1 .  glucose blood (ONETOUCH VERIO) test strip, USE TO TEST BLOOD SUGAR FOUR TIMES DAILY., Disp: 100 strip, Rfl: 3 .  losartan (COZAAR) 50 MG tablet, TAKE 1 TABLET BY MOUTH EVERY DAY, Disp: 90 tablet, Rfl: 1 .  Multiple Vitamins-Minerals (CENTRUM SILVER 50+WOMEN PO), Take 1 tablet by mouth daily., Disp: , Rfl:  .  nystatin cream (MYCOSTATIN), Apply 1 application. topically 2 (two) times daily., Disp: 30 g, Rfl: 0 .  omeprazole (PRILOSEC) 20 MG capsule, TAKE 1 CAPSULE(20 MG) BY MOUTH DAILY, Disp: 90 capsule,  Rfl: 3 .  OZEMPIC, 1 MG/DOSE, 4 MG/3ML SOPN, INJECT 1MG  INTO THE SKIN ONCE WEEKLY, Disp: 3 mL, Rfl: 3 .  rosuvastatin (CRESTOR) 10 MG tablet, TAKE 1 TABLET(10 MG) BY MOUTH DAILY, Disp: 30 tablet, Rfl: 2 .  sodium chloride (MURO 128) 2 % ophthalmic solution, Place 1 drop into both eyes 2 (two) times daily., Disp: , Rfl: :  :  No Known Allergies:   Family History  Problem Relation Age of Onset  . Cancer Mother   . Hypertension Father   :   Social History   Socioeconomic History  . Marital status: Married    Spouse name: Not on file  . Number of children: Not on file  . Years of education: Not on file  . Highest education level: Not on file  Occupational History  . Occupation: retired  Tobacco  Use  . Smoking status: Former    Current packs/day: 0.00    Average packs/day: 1 pack/day for 15.0 years (15.0 ttl pk-yrs)    Types: Cigarettes    Start date: 09/14/1955    Quit date: 07/14/1969    Years since quitting: 54.0  . Smokeless tobacco: Never  . Tobacco comments:    quit 40 years ago  Substance and Sexual Activity  . Alcohol use: Never    Alcohol/week: 0.0 standard drinks of alcohol  . Drug use: Never  . Sexual activity: Not Currently  Other Topics Concern  . Not on file  Social History Narrative  . Not on file   Social Determinants of Health   Financial Resource Strain: Low Risk  (07/10/2022)   Overall Financial Resource Strain (CARDIA)   . Difficulty of Paying Living Expenses: Not hard at all  Food Insecurity: No Food Insecurity (07/10/2022)   Hunger Vital Sign   . Worried About Programme researcher, broadcasting/film/video in the Last Year: Never true   . Ran Out of Food in the Last Year: Never true  Transportation Needs: No Transportation Needs (07/10/2022)   PRAPARE - Transportation   . Lack of Transportation (Medical): No   . Lack of Transportation (Non-Medical): No  Physical Activity: Insufficiently Active (07/10/2022)   Exercise Vital Sign   . Days of Exercise per Week: 5 days   . Minutes of Exercise per Session: 10 min  Stress: No Stress Concern Present (07/10/2022)   [name] of Occupational Health - Occupational Stress Questionnaire   . Feeling of Stress : Not at all  Social Connections: Socially Integrated (07/10/2022)   Social Connection and Isolation Panel [NHANES]   . Frequency of Communication with Friends and Family: More than three times a week   . Frequency of Social Gatherings with Friends and Family: More than three times a week   . Attends Religious Services: More than 4 times per year   . Active Member of Clubs or Organizations: Yes   . Attends Banker Meetings: 1 to 4 times per year   . Marital Status: Married  Catering manager Violence: Not At  Risk (07/10/2022)   Humiliation, Afraid, Rape, and Kick questionnaire   . Fear of Current or Ex-Partner: No   . Emotionally Abused: No   . Physically Abused: No   . Sexually Abused: No  :  Pertinent items are noted in HPI.  Exam: Vital signs show temperature of 98.1.  Pulse 57.  Blood pressure 135/49.  Weight is 138 pounds.  @IPVITALS @ Physical Exam Vitals reviewed.  Constitutional:      Comments: Breast exam  shows left breast with the lumpectomy at the 7 o'clock position.  This is well-healed.  There is no left axillary adenopathy.  Right breast shows a biopsy site at the 12 o'clock position.  This is about a centimeter from the areola.  There is no mass that is noted.  There may be little bit of firmness at the lumpectomy site.  There is no right axillary adenopathy.  HENT:     Head: Normocephalic and atraumatic.  Eyes:     Pupils: Pupils are equal, round, and reactive to light.  Cardiovascular:     Rate and Rhythm: Normal rate and regular rhythm.     Heart sounds: Normal heart sounds.  Pulmonary:     Effort: Pulmonary effort is normal.     Breath sounds: Normal breath sounds.  Abdominal:     General: Bowel sounds are normal.     Palpations: Abdomen is soft.  Musculoskeletal:        General: No tenderness or deformity. Normal range of motion.     Cervical back: Normal range of motion.     Comments: She does have kyphosis.  Lymphadenopathy:     Cervical: No cervical adenopathy.  Skin:    General: Skin is warm and dry.     Findings: No erythema or rash.  Neurological:     Mental Status: She is alert and oriented to person, place, and time.  Psychiatric:        Behavior: Behavior normal.        Thought Content: Thought content normal.        Judgment: Judgment normal.      No results for input(s): "WBC", "HGB", "HCT", "PLT" in the last 72 hours.   No results for input(s): "NA", "K", "CL", "CO2", "GLUCOSE", "BUN", "CREATININE", "CALCIUM" in the last 72  hours.   Blood smear review: None  Pathology: See above    Assessment and Plan: Ms. Vandenbosch is a very nice 88 year old white female.  I have known her for probably 17 years or more.  I actually took care of her husband who had esophageal cancer.  Now she has a second cancer.  This is DCIS of the right breast.  I do not think this is any kind of issue with respect to the DCIS of the left breast that she had back in 2007.  I would think that this can be easily excised.  I think the question is whether or not she is going to need any radiation therapy.  I spoke with Dr. Roselind Messier [facility].  He said that he would offer her short course of radiation therapy if there is a close margin or if this is considered high-grade.  I think that she probably will need an aromatase inhibitor.  We will work on that after she has her lumpectomy.  I suspect the lumpectomy will be done in about a month.  I think we can probably get her back to see [name] in 2 months.  As always her faith is strong.  We had good fellowship.

## 2023-07-12 ENCOUNTER — Encounter: Payer: Self-pay | Admitting: *Deleted

## 2023-07-12 NOTE — Progress Notes (Signed)
Patient seen after her surgery. Due to her age and reassuring path, she will not need any adjuvant treatment. She will proceed with observation only.   Oncology Nurse Navigator Documentation     07/12/2023    8:00 AM  Oncology Nurse Navigator Flowsheets  Navigator Follow Up Date: 10/10/2023  Navigator Follow Up Reason: Follow-up Appointment  Navigator Location [facility]  Navigator Encounter Type Appt/Treatment Plan Review  Patient Visit Type MedOnc  Treatment Phase Post-Tx Follow-up  Barriers/Navigation Needs No Barriers At This Time  Interventions None Required  Acuity Level 1-No Barriers  Time Spent with Patient 15

## 2023-07-13 DIAGNOSIS — C50911 Malignant neoplasm of unspecified site of right female breast: Secondary | ICD-10-CM | POA: Diagnosis not present

## 2023-07-13 DIAGNOSIS — C50912 Malignant neoplasm of unspecified site of left female breast: Secondary | ICD-10-CM | POA: Diagnosis not present

## 2023-07-16 ENCOUNTER — Telehealth: Payer: Self-pay | Admitting: Family Medicine

## 2023-07-16 NOTE — Telephone Encounter (Signed)
Patient called in regards to bill she got from her insurance company stating they won't pay for her second mammogram since they only cover one annually. I advised patient to call insurance company and see if insurance company would cover the mammogram if they received a letter of medical necessity from PCP. Pt verbalized understanding and stated would call back with an update.

## 2023-07-16 NOTE — Telephone Encounter (Signed)
Please give patient billing information

## 2023-07-19 ENCOUNTER — Telehealth: Payer: Self-pay | Admitting: Family Medicine

## 2023-07-19 ENCOUNTER — Encounter: Payer: Self-pay | Admitting: *Deleted

## 2023-07-19 DIAGNOSIS — Z170 Estrogen receptor positive status [ER+]: Secondary | ICD-10-CM

## 2023-07-19 NOTE — Telephone Encounter (Signed)
[employer] would like a call back with information about pt: Why Asprin is not being RX and Lipids panel not done. She does continue on a STATIN.  [phone] Belva Chimes

## 2023-07-19 NOTE — Progress Notes (Signed)
Referral to social work placed per protocol. Patient also qualifies for genetics. Will discuss with her at her follow up appointment.   Oncology Nurse Navigator Documentation     07/19/2023    8:00 AM  Oncology Nurse Navigator Flowsheets  Navigator Follow Up Date: 10/10/2023  Navigator Follow Up Reason: Follow-up Appointment  Navigator Location [facility]  Navigator Encounter Type Appt/Treatment Plan Review  Patient Visit Type MedOnc  Treatment Phase Post-Tx Follow-up  Barriers/Navigation Needs No Barriers At This Time  Interventions Referrals  Acuity Level 1-No Barriers  Referrals Social Work  Time Spent with Patient 15

## 2023-07-23 ENCOUNTER — Telehealth: Payer: Self-pay

## 2023-07-23 NOTE — Telephone Encounter (Signed)
Clinical Social Work was referred by medical provider for assessment of psychosocial needs.  CSW attempted to contact patient by phone.  Left voicemail with contact information and request for return call.

## 2023-07-23 NOTE — Telephone Encounter (Signed)
Please advise 

## 2023-07-24 DIAGNOSIS — H02831 Dermatochalasis of right upper eyelid: Secondary | ICD-10-CM | POA: Diagnosis not present

## 2023-07-24 DIAGNOSIS — H353131 Nonexudative age-related macular degeneration, bilateral, early dry stage: Secondary | ICD-10-CM | POA: Diagnosis not present

## 2023-07-24 DIAGNOSIS — E119 Type 2 diabetes mellitus without complications: Secondary | ICD-10-CM | POA: Diagnosis not present

## 2023-07-24 DIAGNOSIS — D3131 Benign neoplasm of right choroid: Secondary | ICD-10-CM | POA: Diagnosis not present

## 2023-07-24 DIAGNOSIS — Z961 Presence of intraocular lens: Secondary | ICD-10-CM | POA: Diagnosis not present

## 2023-07-24 DIAGNOSIS — H02834 Dermatochalasis of left upper eyelid: Secondary | ICD-10-CM | POA: Diagnosis not present

## 2023-07-24 DIAGNOSIS — H18593 Other hereditary corneal dystrophies, bilateral: Secondary | ICD-10-CM | POA: Diagnosis not present

## 2023-07-24 DIAGNOSIS — H04123 Dry eye syndrome of bilateral lacrimal glands: Secondary | ICD-10-CM | POA: Diagnosis not present

## 2023-07-24 LAB — HM DIABETES EYE EXAM

## 2023-07-24 NOTE — Telephone Encounter (Signed)
Recommend patient schedule an office visit to discuss or we can discuss at her next appointment.  [name]. [name], MD 07/24/2023 7:41 AM

## 2023-07-24 NOTE — Telephone Encounter (Signed)
Please schedule OV with PCP to discuss Lipid and medication

## 2023-07-24 NOTE — Telephone Encounter (Signed)
Patient has been scheduled for 07/25/23 @ 8:20 am w/PCP.

## 2023-07-25 ENCOUNTER — Ambulatory Visit: Payer: PPO | Admitting: Family Medicine

## 2023-07-25 ENCOUNTER — Encounter: Payer: Self-pay | Admitting: Family Medicine

## 2023-07-25 VITALS — BP 136/64 | HR 62 | Temp 97.3°F | Ht 62.0 in | Wt 134.2 lb

## 2023-07-25 DIAGNOSIS — M2040 Other hammer toe(s) (acquired), unspecified foot: Secondary | ICD-10-CM

## 2023-07-25 DIAGNOSIS — E1169 Type 2 diabetes mellitus with other specified complication: Secondary | ICD-10-CM

## 2023-07-25 DIAGNOSIS — E785 Hyperlipidemia, unspecified: Secondary | ICD-10-CM

## 2023-07-25 LAB — LIPID PANEL
Cholesterol: 114 mg/dL (ref 0–200)
HDL: 33.2 mg/dL — ABNORMAL LOW (ref >39.00)
LDL Cholesterol: 51 mg/dL (ref 0–99)
NonHDL: 80.56
Total CHOL/HDL Ratio: 3
Triglycerides: 149 mg/dL (ref 0.0–149.0)
VLDL: 29.8 mg/dL (ref 0.0–40.0)

## 2023-07-25 NOTE — Assessment & Plan Note (Signed)
On Crestor 10 mg daily.  Will check lipids today.  Also discussed aspirin use.  She is not current on aspirin.  Was previously told that this does increase risk for bleeds.  She like to hold off on starting at this point.  Due to her overall risk and benefits of aspirin use and weak evidence for supporting use of aspirin for primary prevention even in diabetes, it would be reasonable for her to continue holding off on this for now.

## 2023-07-25 NOTE — Patient Instructions (Addendum)
It was very nice to see you today!  We will check your cholesterol levels today.  It is okay for you to stay off on aspirin  Let us know if you test to give you more issues.  Return in about 3 months (around 10/24/2023) for Follow Up.   Take care, Dr [name]  PLEASE NOTE:  If you had any lab tests, please let us know if you have not heard back within a few days. You may see your results on mychart before we have a chance to review them but we will give you a call once they are reviewed by [name].   If we ordered any referrals today, please let us know if you have not heard from their office within the next week.   If you had any urgent prescriptions sent in today, please check with the pharmacy within an hour of our visit to make sure the prescription was transmitted appropriately.   Please try these tips to maintain a healthy lifestyle:  Eat at least 3 REAL meals and 1-2 snacks per day.  Aim for no more than 5 hours between eating.  If you eat breakfast, please do so within one hour of getting up.   Each meal should contain half fruits/vegetables, one quarter protein, and one quarter carbs (no bigger than a computer mouse)  Cut down on sweet beverages. This includes juice, soda, and sweet tea.   Drink at least 1 glass of water with each meal and aim for at least 8 glasses per day  Exercise at least 150 minutes every week.

## 2023-07-25 NOTE — Progress Notes (Signed)
   [name] is a 88 y.o. female who presents today for an office visit.  Assessment/Plan:  Chronic Problems Addressed Today: Dyslipidemia associated with type 2 diabetes mellitus (HCC) On Crestor 10 mg daily.  Will check lipids today.  Also discussed aspirin use.  She is not current on aspirin.  Was previously told that this does increase risk for bleeds.  She like to hold off on starting at this point.  Due to her overall risk and benefits of aspirin use and weak evidence for supporting use of aspirin for primary prevention even in diabetes, it would be reasonable for her to continue holding off on this for now.  Hammer toe Reassured patient.  We discussed referral to podiatry however she deferred.  She has good distal cap refill and no signs of arterial insufficiency.  We discussed warning signs and reasons to return to care.     Subjective:  HPI:  See Assessment / plan for status of chronic conditions.   Patient here today to discuss her cholesterol.  She is on Crestor 10 mg daily.  Tolerating well.  She has been on this for many years.  No significant side effects.  Also has hammertoe deformity.  Does not cause her any pain though does occasionally notice discoloration.       Objective:  Physical Exam: BP 136/64   Pulse 62   Temp (!) 97.3 F (36.3 C) (Temporal)   Ht 5\' 2"  (1.575 m)   Wt 134 lb 3.2 oz (60.9 kg)   SpO2 98%   BMI 24.55 kg/m   Gen: No acute distress, resting comfortably MUSCULOSKELETAL - Foot: Hammertoe deformity on the left foot.  Onychomycosis noted.  Good distal cap refill. Neuro: Grossly normal, moves all extremities Psych: Normal affect and thought content      Brenda Marinos [name], MD 07/25/2023 8:56 AM

## 2023-07-25 NOTE — Assessment & Plan Note (Signed)
Reassured patient.  We discussed referral to podiatry however she deferred.  She has good distal cap refill and no signs of arterial insufficiency.  We discussed warning signs and reasons to return to care.

## 2023-07-26 DIAGNOSIS — C50912 Malignant neoplasm of unspecified site of left female breast: Secondary | ICD-10-CM | POA: Diagnosis not present

## 2023-07-30 ENCOUNTER — Telehealth: Payer: Self-pay | Admitting: Family Medicine

## 2023-07-30 NOTE — Progress Notes (Signed)
Cholesterol levels at goal.  We can recheck again in a year.

## 2023-07-30 NOTE — Telephone Encounter (Signed)
Patient requests new RX for 90 Day Supply of  rosuvastatin (CRESTOR) 10 MG tablet Be sent to: [facility] [phone] - [place], [place] - [address] AT [facility] [name] Phone: [phone]  Fax: [phone]

## 2023-07-31 ENCOUNTER — Other Ambulatory Visit: Payer: Self-pay | Admitting: *Deleted

## 2023-07-31 DIAGNOSIS — C50919 Malignant neoplasm of unspecified site of unspecified female breast: Secondary | ICD-10-CM

## 2023-07-31 DIAGNOSIS — E785 Hyperlipidemia, unspecified: Secondary | ICD-10-CM

## 2023-07-31 MED ORDER — ROSUVASTATIN CALCIUM 10 MG PO TABS
10.0000 mg | ORAL_TABLET | Freq: Every day | ORAL | 2 refills | Status: DC
Start: 2023-07-31 — End: 2023-08-17

## 2023-07-31 NOTE — Telephone Encounter (Signed)
Rx send to pharmacy  

## 2023-08-08 ENCOUNTER — Other Ambulatory Visit: Payer: Self-pay | Admitting: Family Medicine

## 2023-08-08 ENCOUNTER — Telehealth: Payer: Self-pay

## 2023-08-08 DIAGNOSIS — C50919 Malignant neoplasm of unspecified site of unspecified female breast: Secondary | ICD-10-CM

## 2023-08-08 DIAGNOSIS — E785 Hyperlipidemia, unspecified: Secondary | ICD-10-CM

## 2023-08-08 NOTE — Telephone Encounter (Signed)
Clinical Social Work returned patient's call from when CSW phoned her on 07/23/23.  Attempted to contact patient by phone.  Left voicemail and contact information and told her the reason for the call.

## 2023-08-17 ENCOUNTER — Telehealth: Payer: Self-pay | Admitting: Family Medicine

## 2023-08-17 DIAGNOSIS — C50919 Malignant neoplasm of unspecified site of unspecified female breast: Secondary | ICD-10-CM

## 2023-08-17 DIAGNOSIS — E785 Hyperlipidemia, unspecified: Secondary | ICD-10-CM

## 2023-08-17 MED ORDER — ROSUVASTATIN CALCIUM 10 MG PO TABS
10.0000 mg | ORAL_TABLET | Freq: Every day | ORAL | 1 refills | Status: DC
Start: 2023-08-17 — End: 2024-02-29

## 2023-08-17 NOTE — Telephone Encounter (Signed)
Patient requests RX for  rosuvastatin (CRESTOR) 10 MG tablet  be changed to 90 day supply and resent to:  [facility] [phone] - [place], [place] - [address] AT [facility] [name] Phone: [phone]  Fax: [phone]

## 2023-08-17 NOTE — Telephone Encounter (Signed)
Pt notified Rx sent to pharmacy as requested. 

## 2023-08-27 ENCOUNTER — Other Ambulatory Visit: Payer: Self-pay | Admitting: Family Medicine

## 2023-09-12 ENCOUNTER — Ambulatory Visit: Payer: PPO

## 2023-09-12 VITALS — Wt 134.0 lb

## 2023-09-12 DIAGNOSIS — Z0000 Encounter for general adult medical examination without abnormal findings: Secondary | ICD-10-CM

## 2023-09-12 NOTE — Progress Notes (Signed)
Subjective:   [name] is a 88 y.o. female who presents for Medicare Annual (Subsequent) preventive examination.  Visit Complete: Virtual I connected with  [name] on 09/12/23 by a audio enabled telemedicine application and verified that I am speaking with the correct person using two identifiers.  Patient Location: Home  Provider Location: Home Office  I discussed the limitations of evaluation and management by telemedicine. The patient expressed understanding and agreed to proceed.  Vital Signs: Because this visit was a virtual/telehealth visit, some criteria may be missing or patient reported. Any vitals not documented were not able to be obtained and vitals that have been documented are patient reported.  Cardiac Risk Factors include: advanced age (>30men, >71 women);dyslipidemia;diabetes mellitus;hypertension     Objective:    Today's Vitals   09/12/23 1112  Weight: 134 lb (60.8 kg)   Body mass index is 24.51 kg/m.     09/12/2023   11:21 AM 07/11/2023    3:26 PM 06/06/2023    8:11 AM 05/31/2023   12:40 PM 06/13/2021    3:23 PM 06/07/2020    3:20 PM 05/07/2019   11:01 AM  Advanced Directives  Does Patient Have a Medical Advance Directive? Yes Yes Yes Yes Yes Yes Yes  Type of Estate agent of [place];Living will Healthcare Power of [place];Living will Healthcare Power of [place];Living will Healthcare Power of [place];Living will Living will Living will Healthcare Power of [place];Living will  Does patient want to make changes to medical advance directive?   No - Patient declined No - Patient declined   No - Patient declined  Copy of Healthcare Power of Attorney in Chart? No - copy requested  No - copy requested No - copy requested       Current Medications (verified) Outpatient Encounter Medications as of 09/12/2023  Medication Sig   blood glucose meter kit and supplies KIT Dispense based on patient and insurance preference. Use up  to four times daily as directed. (FOR ICD-9 250.00, 250.01).   Cholecalciferol (VITAMIN D-3) 1000 UNITS CAPS Take 2,000 Units by mouth 2 (two) times daily.   cinacalcet (SENSIPAR) 30 MG tablet Take 1 tablet (30 mg total) by mouth daily with breakfast.   losartan (COZAAR) 50 MG tablet TAKE 1 TABLET BY MOUTH EVERY DAY   Multiple Vitamins-Minerals (CENTRUM SILVER 50+WOMEN PO) Take 1 tablet by mouth daily.   omeprazole (PRILOSEC) 20 MG capsule TAKE 1 CAPSULE(20 MG) BY MOUTH DAILY   ONETOUCH VERIO test strip USE TO TEST BLOOD SUGAR FOUR TIMES DAILY.   OZEMPIC, 1 MG/DOSE, 4 MG/3ML SOPN INJECT 1MG  INTO THE SKIN ONCE WEEKLY   rosuvastatin (CRESTOR) 10 MG tablet Take 1 tablet (10 mg total) by mouth daily.   sodium chloride (MURO 128) 2 % ophthalmic solution Place 1 drop into both eyes 2 (two) times daily.   vitamin C (ASCORBIC ACID) 250 MG tablet Take 250 mg by mouth daily.   No facility-administered encounter medications on file as of 09/12/2023.    Allergies (verified) Patient has no known allergies.   History: Past Medical History:  Diagnosis Date   Arthritis    Cancer (HCC)    Diabetes mellitus without complication (HCC)    GERD (gastroesophageal reflux disease)    Hyperlipidemia    Hypertension    Kidney stones    Thyroid disease    Past Surgical History:  Procedure Laterality Date   BACK SURGERY     BREAST BIOPSY  06/05/2023   MM  RT RADIOACTIVE SEED LOC MAMMO GUIDE 06/05/2023 [facility] MAMMOGRAP[facility]   BREAST LUMPECTOMY WITH RADIOACTIVE SEED LOCALIZATION Right 06/06/2023   Procedure: RIGHT BREAST LUMPECTOMY WITH RADIOACTIVE SEED LOCALIZATION;  Surgeon: Manus Rudd, MD;  Location: [facility];  Service: General;  Laterality: Right;  LMA   BREAST SURGERY     MELANOMA EXCISION     TONSILLECTOMY     Family History  Problem Relation Age of Onset   Cancer Mother    Hypertension Father    Social History   Socioeconomic History   Marital status: Widowed    Spouse name: Not  on file   Number of children: Not on file   Years of education: Not on file   Highest education level: Not on file  Occupational History   Occupation: retired  Tobacco Use   Smoking status: Former    Current packs/day: 0.00    Average packs/day: 1 pack/day for 15.0 years (15.0 ttl pk-yrs)    Types: Cigarettes    Start date: 09/14/1955    Quit date: 07/14/1969    Years since quitting: 54.2   Smokeless tobacco: Never   Tobacco comments:    quit 40 years ago  Vaping Use   Vaping status: Never Used  Substance and Sexual Activity   Alcohol use: Never    Alcohol/week: 0.0 standard drinks of alcohol   Drug use: Never   Sexual activity: Not Currently  Other Topics Concern   Not on file  Social History Narrative   Not on file   Social Determinants of Health   Financial Resource Strain: Low Risk  (07/10/2022)   Overall Financial Resource Strain (CARDIA)    Difficulty of Paying Living Expenses: Not hard at all  Food Insecurity: No Food Insecurity (07/10/2022)   Hunger Vital Sign    Worried About Running Out of Food in the Last Year: Never true    Ran Out of Food in the Last Year: Never true  Transportation Needs: No Transportation Needs (07/10/2022)   PRAPARE - Administrator, Civil Service (Medical): No    Lack of Transportation (Non-Medical): No  Physical Activity: Sufficiently Active (09/12/2023)   Exercise Vital Sign    Days of Exercise per Week: 3 days    Minutes of Exercise per Session: 60 min  Stress: No Stress Concern Present (09/12/2023)   [employer] of Occupational Health - Occupational Stress Questionnaire    Feeling of Stress : Not at all  Social Connections: Moderately Integrated (09/12/2023)   Social Connection and Isolation Panel [NHANES]    Frequency of Communication with Friends and Family: More than three times a week    Frequency of Social Gatherings with Friends and Family: More than three times a week    Attends Religious Services: More  than 4 times per year    Active Member of [employer] or Organizations: Yes    Attends Banker Meetings: 1 to 4 times per year    Marital Status: Widowed    Tobacco Counseling Counseling given: Not Answered Tobacco comments: quit 40 years ago   Clinical Intake:  Pre-visit preparation completed: Yes  Pain : No/denies pain     BMI - recorded: 24.51 Nutritional Status: BMI of 19-24  Normal Nutritional Risks: None Diabetes: Yes CBG done?: Yes (146  per pt) CBG resulted in Enter/ Edit results?: No Did pt. bring in CBG monitor from home?: No  How often do you need to have someone help you when you read instructions,  pamphlets, or other written materials from your doctor or pharmacy?: 1 - Never  Interpreter Needed?: No  Information entered by :: Lanier Ensign, LPN   Activities of Daily Living    09/12/2023   11:16 AM 06/06/2023    8:24 AM  In your present state of health, do you have any difficulty performing the following activities:  Hearing? 1 0  Comment hearing aids   Vision? 0 0  Difficulty concentrating or making decisions? 0 0  Walking or climbing stairs? 0 0  Dressing or bathing? 0 0  Doing errands, shopping? 0   Preparing Food and eating ? N   Using the Toilet? N   In the past six months, have you accidently leaked urine? N   Do you have problems with loss of bowel control? N   Managing your Medications? N   Managing your Finances? N   Housekeeping or managing your Housekeeping? N     Patient Care Team: Ardith Dark, MD as PCP - General (Family Medicine) [facility] Dahlia Byes, [facility] (Inactive) as Pharmacist (Pharmacist) Pershing Proud, RN as Oncology Nurse Navigator Donnelly Angelica, RN as Oncology Nurse Navigator Josph Macho, MD as Medical Oncologist (Oncology) Gwendel Hanson, RN as Oncology Nurse Navigator Manus Rudd, MD as Consulting Physician (General Surgery)  Indicate any recent Medical Services you may  have received from other than [facility] providers in the past year (date may be approximate).     Assessment:   This is a routine wellness examination for Kameron.  Hearing/Vision screen Hearing Screening - Comments:: Pt has hearing aids  Vision Screening - Comments:: Pt follows up with dr Dione Booze for annual eye exams    Goals Addressed             This Visit's Progress    Patient Stated       Maintain health and activity        Depression Screen    09/12/2023   11:19 AM 07/25/2023    8:22 AM 06/18/2023   11:31 AM 03/01/2023    1:31 PM 12/01/2022    9:59 AM 05/29/2022    9:35 AM 02/27/2022    8:36 AM  PHQ 2/9 Scores  PHQ - 2 Score 0 0 0 0 0 0 0  PHQ- 9 Score    0       Fall Risk    09/12/2023   11:22 AM 07/25/2023    8:22 AM 06/18/2023   11:31 AM 03/01/2023    1:31 PM 12/01/2022    9:59 AM  Fall Risk   Falls in the past year? 0 0 0 1 0  Number falls in past yr: 0 0 0 1 0  Injury with Fall? 0 0 0 0 0  Risk for fall due to : No Fall Risks No Fall Risks No Fall Risks No Fall Risks;Impaired balance/gait No Fall Risks  Follow up Falls prevention discussed        MEDICARE RISK AT HOME: Medicare Risk at Home Any stairs in or around the home?: No If so, are there any without handrails?: No Home free of loose throw rugs in walkways, pet beds, electrical cords, etc?: Yes Adequate lighting in your home to reduce risk of falls?: Yes Life alert?: No Use of a cane, walker or w/c?: No Grab bars in the bathroom?: Yes Shower chair or bench in shower?: Yes Elevated toilet seat or a handicapped toilet?: No  TIMED UP AND GO:  Was the test performed?  No    Cognitive Function:        09/12/2023   11:22 AM 07/10/2022    3:22 PM 06/13/2021    3:27 PM  6CIT Screen  What Year? 0 points 0 points 0 points  What month? 0 points 0 points 0 points  What time? 0 points 0 points 0 points  Count back from 20 0 points 0 points 0 points  Months in reverse 0 points 0 points 0 points  Repeat  phrase 0 points 0 points 0 points  Total Score 0 points 0 points 0 points    Immunizations Immunization History  Administered Date(s) Administered   Fluad Quad(high Dose 65+) 08/12/2019, 07/29/2020, 08/29/2022   Influenza Split 10/05/2004, 06/28/2010   Influenza,inj,Quad PF,6+ Mos 08/12/2015   Influenza-Unspecified 09/11/2016, 08/14/2017, 08/12/2018, 08/30/2021   PFIZER(Purple Top)SARS-COV-2 Vaccination 11/19/2019, 12/10/2019, 08/03/2020, 11/01/2021   Pfizer Covid-19 Vaccine Bivalent Booster 61yrs & up 11/01/2021   Pneumococcal Conjugate-13 07/08/2014   Pneumococcal Polysaccharide-23 03/05/2014   Tdap 03/15/2011, 04/29/2022   Zoster Recombinant(Shingrix) 12/14/2021, 02/16/2022   Zoster, Live 01/14/2007    TDAP status: Up to date  Flu Vaccine status: Up to date  Pneumococcal vaccine status: Up to date  Covid-19 vaccine status: Completed vaccines  Qualifies for Shingles Vaccine? Yes   Zostavax completed Yes   Shingrix Completed?: Yes  Screening Tests Health Maintenance  Topic Date Due   FOOT EXAM  06/14/2022   INFLUENZA VACCINE  01/28/2024 (Originally 05/31/2023)   HEMOGLOBIN A1C  12/19/2023   OPHTHALMOLOGY EXAM  07/23/2024   Medicare Annual Wellness (AWV)  09/11/2024   DTaP/Tdap/Td (3 - Td or Tdap) 04/29/2032   Pneumonia Vaccine 53+ Years old  Completed   DEXA SCAN  Completed   COVID-19 Vaccine  Completed   Zoster Vaccines- Shingrix  Completed   HPV VACCINES  Aged Out    Health Maintenance  Health Maintenance Due  Topic Date Due   FOOT EXAM  06/14/2022    Colorectal cancer screening: No longer required.   Mammogram status: Completed 06/06/23. Repeat every year  Bone Density status: Completed 04/13/22. Results reflect: Bone density results: OSTEOPENIA. Repeat every 2 years.   Additional Screening:  Vision Screening: Recommended annual ophthalmology exams for early detection of glaucoma and other disorders of the eye. Is the patient up to date with their  annual eye exam?  Yes  Who is the provider or what is the name of the office in which the patient attends annual eye exams? Dr Dione Booze  If pt is not established with a provider, would they like to be referred to a provider to establish care? No .   Dental Screening: Recommended annual dental exams for proper oral hygiene  Diabetic Foot Exam: Diabetic Foot Exam: Overdue, Pt has been advised about the importance in completing this exam. Pt is scheduled for diabetic foot exam on next appt .  Community Resource Referral / Chronic Care Management: CRR required this visit?  No   CCM required this visit?  No     Plan:     I have personally reviewed and noted the following in the patient's chart:   Medical and social history Use of alcohol, tobacco or illicit drugs  Current medications and supplements including opioid prescriptions. Patient is not currently taking opioid prescriptions. Functional ability and status Nutritional status Physical activity Advanced directives List of other physicians Hospitalizations, surgeries, and ER visits in previous 12 months Vitals Screenings to include cognitive, depression, and falls Referrals  and appointments  In addition, I have reviewed and discussed with patient certain preventive protocols, quality metrics, and best practice recommendations. A written personalized care plan for preventive services as well as general preventive health recommendations were provided to patient.     Marzella Schlein, LPN   [id number]   After Visit Summary: (MyChart) Due to this being a telephonic visit, the after visit summary with patients personalized plan was offered to patient via MyChart   Nurse Notes: none

## 2023-09-12 NOTE — Patient Instructions (Signed)
[name] , Thank you for taking time to come for your Medicare Wellness Visit. I appreciate your ongoing commitment to your health goals. Please review the following plan we discussed and let me know if I can assist you in the future.   Referrals/Orders/Follow-Ups/Clinician Recommendations: mainatin health and activity Aim for 30 minutes of exercise or brisk walking, 6-8 glasses of water, and 5 servings of fruits and vegetables each day.    This is a list of the screening recommended for you and due dates:  Health Maintenance  Topic Date Due   Complete foot exam   06/14/2022   Flu Shot  01/28/2024*   Hemoglobin A1C  12/19/2023   Eye exam for diabetics  07/23/2024   Medicare Annual Wellness Visit  09/11/2024   DTaP/Tdap/Td vaccine (3 - Td or Tdap) 04/29/2032   Pneumonia Vaccine  Completed   DEXA scan (bone density measurement)  Completed   COVID-19 Vaccine  Completed   Zoster (Shingles) Vaccine  Completed   HPV Vaccine  Aged Out  *Topic was postponed. The date shown is not the original due date.    Advanced directives: (Copy Requested) Please bring a copy of your health care power of attorney and living will to the office to be added to your chart at your convenience.  Next Medicare Annual Wellness Visit scheduled for next year: Yes

## 2023-09-20 ENCOUNTER — Ambulatory Visit: Payer: PPO | Admitting: Family Medicine

## 2023-10-10 ENCOUNTER — Inpatient Hospital Stay: Payer: PPO | Admitting: Hematology & Oncology

## 2023-10-10 ENCOUNTER — Inpatient Hospital Stay: Payer: PPO | Attending: Hematology & Oncology

## 2023-10-11 ENCOUNTER — Encounter: Payer: Self-pay | Admitting: *Deleted

## 2023-10-11 NOTE — Progress Notes (Unsigned)
Patient was a no-show to yesterday's appointment. Message sent to scheduling to reschedule patient. Not urgent.

## 2023-10-30 ENCOUNTER — Other Ambulatory Visit: Payer: Self-pay | Admitting: Family Medicine

## 2023-11-01 ENCOUNTER — Encounter: Payer: Self-pay | Admitting: *Deleted

## 2023-11-01 ENCOUNTER — Telehealth: Payer: Self-pay

## 2023-11-01 ENCOUNTER — Encounter: Payer: Self-pay | Admitting: Hematology & Oncology

## 2023-11-01 ENCOUNTER — Inpatient Hospital Stay: Payer: PPO | Admitting: Hematology & Oncology

## 2023-11-01 ENCOUNTER — Ambulatory Visit: Payer: PPO | Admitting: Family Medicine

## 2023-11-01 ENCOUNTER — Inpatient Hospital Stay: Payer: PPO | Attending: Hematology & Oncology

## 2023-11-01 VITALS — BP 163/63 | HR 58 | Temp 97.9°F | Resp 20 | Ht 62.0 in | Wt 133.1 lb

## 2023-11-01 DIAGNOSIS — C50011 Malignant neoplasm of nipple and areola, right female breast: Secondary | ICD-10-CM

## 2023-11-01 DIAGNOSIS — C50012 Malignant neoplasm of nipple and areola, left female breast: Secondary | ICD-10-CM | POA: Diagnosis not present

## 2023-11-01 DIAGNOSIS — D0511 Intraductal carcinoma in situ of right breast: Secondary | ICD-10-CM

## 2023-11-01 DIAGNOSIS — Z170 Estrogen receptor positive status [ER+]: Secondary | ICD-10-CM | POA: Diagnosis not present

## 2023-11-01 DIAGNOSIS — Z79899 Other long term (current) drug therapy: Secondary | ICD-10-CM | POA: Diagnosis not present

## 2023-11-01 LAB — CBC WITH DIFFERENTIAL (CANCER CENTER ONLY)
Abs Immature Granulocytes: 0.05 10*3/uL (ref 0.00–0.07)
Basophils Absolute: 0 10*3/uL (ref 0.0–0.1)
Basophils Relative: 0 %
Eosinophils Absolute: 0.2 10*3/uL (ref 0.0–0.5)
Eosinophils Relative: 1 %
HCT: 43.5 % (ref 36.0–46.0)
Hemoglobin: 15.2 g/dL — ABNORMAL HIGH (ref 12.0–15.0)
Immature Granulocytes: 0 %
Lymphocytes Relative: 21 %
Lymphs Abs: 3 10*3/uL (ref 0.7–4.0)
MCH: 30.9 pg (ref 26.0–34.0)
MCHC: 34.9 g/dL (ref 30.0–36.0)
MCV: 88.4 fL (ref 80.0–100.0)
Monocytes Absolute: 0.6 10*3/uL (ref 0.1–1.0)
Monocytes Relative: 4 %
Neutro Abs: 10.6 10*3/uL — ABNORMAL HIGH (ref 1.7–7.7)
Neutrophils Relative %: 74 %
Platelet Count: 271 10*3/uL (ref 150–400)
RBC: 4.92 MIL/uL (ref 3.87–5.11)
RDW: 13.1 % (ref 11.5–15.5)
WBC Count: 14.4 10*3/uL — ABNORMAL HIGH (ref 4.0–10.5)
nRBC: 0 % (ref 0.0–0.2)

## 2023-11-01 LAB — CMP (CANCER CENTER ONLY)
ALT: 17 U/L (ref 0–44)
AST: 19 U/L (ref 15–41)
Albumin: 4.1 g/dL (ref 3.5–5.0)
Alkaline Phosphatase: 77 U/L (ref 38–126)
Anion gap: 7 (ref 5–15)
BUN: 12 mg/dL (ref 8–23)
CO2: 30 mmol/L (ref 22–32)
Calcium: 10.2 mg/dL (ref 8.9–10.3)
Chloride: 103 mmol/L (ref 98–111)
Creatinine: 0.88 mg/dL (ref 0.44–1.00)
GFR, Estimated: >60 mL/min (ref >60)
Glucose, Bld: 142 mg/dL — ABNORMAL HIGH (ref 70–99)
Potassium: 4.7 mmol/L (ref 3.5–5.1)
Sodium: 140 mmol/L (ref 135–145)
Total Bilirubin: 0.6 mg/dL (ref 0.0–1.2)
Total Protein: 7.3 g/dL (ref 6.5–8.1)

## 2023-11-01 NOTE — Progress Notes (Signed)
 Hematology and Oncology Follow Up Visit  [name] [id number] [birth year] 89 y.o. 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  We last saw her back in September.  Since then, she been doing pretty well.  She enjoyed the Holiday season with her family.  She is doing quite nicely.  She really has had no complaints outside of a little bit of tenderness over in the left lateral chest wall.  I think she has a specialized bra that might be a little bit heavy for her on that side.  She has had no issues with fever.  She is doing a lot of exercising.  She is going to do water aerobics to try to help with her hips.  She has had no change in bowel or bladder habits.  She has had no nausea or vomiting.  Her appetite has been okay.  Her weight is holding steady.  She has had no leg swelling.  She has had no rashes.  There has been no bleeding.  Overall, I would say that her performance status is ECOG 1.  Medications:  Current Outpatient Medications:    blood glucose meter kit and supplies KIT, Dispense based on patient and insurance preference. Use up to four times daily as directed. (FOR ICD-9 250.00, 250.01)., Disp: 1 each, Rfl: 0   Cholecalciferol (VITAMIN D -3) 1000 UNITS CAPS, Take 2,000 Units by mouth 2 (two) times daily., Disp: , Rfl:    cinacalcet  (SENSIPAR ) 30 MG tablet, Take 1 tablet (30 mg total) by mouth daily with breakfast., Disp: 90 tablet, Rfl: 1   losartan  (COZAAR ) 50 MG tablet, TAKE 1 TABLET BY MOUTH EVERY DAY, Disp: 90 tablet, Rfl: 1   Multiple Vitamins-Minerals (CENTRUM SILVER 50+WOMEN PO), Take 1 tablet by mouth daily., Disp: , Rfl:    omeprazole  (PRILOSEC) 20 MG capsule, TAKE 1 CAPSULE(20 MG) BY MOUTH DAILY, Disp: 90 capsule, Rfl: 3   ONETOUCH VERIO test strip, USE TO TEST BLOOD SUGAR  FOUR TIMES DAILY., Disp: 100 strip, Rfl: 3   OZEMPIC , 1 MG/DOSE, 4 MG/3ML SOPN, INJECT 1MG  INTO THE SKIN ONCE WEEKLY, Disp: 3 mL, Rfl: 3   rosuvastatin  (CRESTOR ) 10 MG tablet, Take 1 tablet (10 mg total) by mouth daily., Disp: 90 tablet, Rfl: 1   sodium chloride (MURO 128) 2 % ophthalmic solution, Place 1 drop into both eyes 2 (two) times daily., Disp: , Rfl:    vitamin C (ASCORBIC ACID) 250 MG tablet, Take 250 mg by mouth daily., Disp: , Rfl:   Allergies: No Known Allergies  Past Medical History, Surgical history, Social history, and Family History were reviewed and updated.  Review of Systems: Review of Systems  Constitutional: Negative.   HENT:  Negative.    Eyes: Negative.   Respiratory: Negative.    Cardiovascular: Negative.   Gastrointestinal: Negative.   Endocrine: Negative.   Genitourinary: Negative.    Musculoskeletal: Negative.   Skin: Negative.   Neurological: Negative.   Hematological: Negative.   Psychiatric/Behavioral: Negative.      Physical Exam:  height is 5' 2 (1.575 m) and weight is 133 lb 1.9 oz (60.4 kg). Her oral temperature is 97.9 F (36.6 C). Her blood pressure is 163/63 (abnormal) and her pulse is 58 (abnormal). Her respiration is 20 and oxygen saturation is 100%.  Wt Readings from Last 3 Encounters:  11/01/23 133 lb 1.9 oz (60.4 kg)  09/12/23 134 lb (60.8 kg)  07/25/23 134 lb 3.2 oz (60.9 kg)    Physical Exam Vitals reviewed.  Constitutional:      Comments: Her right breast shows the lumpectomy scar at the edge of the areola.  This is about the 11 o'clock position.  It is well-healed.  There is no mass in the right breast.  There is no nipple discharge on the right side.  There is no right axillary adenopathy.  Left breast shows a well-healed lumpectomy at about the 7 o'clock position.  There is no mass in the left breast.  There is no left axillary adenopathy.  HENT:     Head: Normocephalic and atraumatic.  Eyes:     Pupils: Pupils are equal,  round, and reactive to light.  Cardiovascular:     Rate and Rhythm: Normal rate and regular rhythm.     Heart sounds: Normal heart sounds.  Pulmonary:     Effort: Pulmonary effort is normal.     Breath sounds: Normal breath sounds.  Abdominal:     General: Bowel sounds are normal.     Palpations: Abdomen is soft.  Musculoskeletal:        General: No tenderness or deformity. Normal range of motion.     Cervical back: Normal range of motion.  Lymphadenopathy:     Cervical: No cervical adenopathy.  Skin:    General: Skin is warm and dry.     Findings: No erythema or rash.  Neurological:     Mental Status: She is alert and oriented to person, place, and time.  Psychiatric:        Behavior: Behavior normal.        Thought Content: Thought content normal.        Judgment: Judgment normal.     Lab Results  Component Value Date   WBC 14.4 (H) 11/01/2023   HGB 15.2 (H) 11/01/2023   HCT 43.5 11/01/2023   MCV 88.4 11/01/2023   PLT 271 11/01/2023     Chemistry      Component Value Date/Time   NA 140 11/01/2023 1145   NA 143 05/28/2023 0947   NA 145 02/15/2017 1333   NA 144 08/12/2015 1419   K 4.7 11/01/2023 1145   K 4.4 02/15/2017 1333   K 4.2 08/12/2015 1419   CL 103 11/01/2023 1145   CL 104 02/15/2017 1333   CO2 30 11/01/2023 1145   CO2 28 02/15/2017 1333   CO2 29 08/12/2015 1419   BUN 12 11/01/2023 1145   BUN 6 (L) 05/28/2023 0947   BUN 13 02/15/2017 1333   BUN 11.8 08/12/2015 1419   CREATININE 0.88 11/01/2023 1145   CREATININE 0.82 03/24/2020 1122   CREATININE 0.8 08/12/2015 1419   GLU 197 09/18/2018 0000      Component Value Date/Time   CALCIUM  10.2 11/01/2023 1145   CALCIUM  10.2 02/15/2017 1333   CALCIUM  11.1 (H) 08/12/2015 1419   ALKPHOS 77 11/01/2023 1145   ALKPHOS 76 02/15/2017 1333   ALKPHOS 74 08/12/2015 1419   AST 19 11/01/2023 1145   AST 26 08/12/2015 1419   ALT 17 11/01/2023 1145   ALT 39 02/15/2017 1333   ALT 32 08/12/2015 1419   BILITOT 0.6  11/01/2023 1145   BILITOT 0.52 08/12/2015 1419       Impression and Plan: [name] is a very charming 89 year old postmenopausal white female.  She was diagnosed with DCIS of the right breast.  This was in August 2024.  She underwent a lumpectomy.  I do not feel that there is any indication for adjuvant therapy.  We will plan to get her back in 4 months.  We will get her through the Winter.  I do not want to have her come in with any possible inclement weather.  Hopefully, it should go down to [place]  to visit family.  I think this would really help her out and she would have a wonderful time.    Maude JONELLE Crease, MD 1/2/202512:18 PM

## 2023-11-01 NOTE — Progress Notes (Signed)
 BP remains elevated, 165/66, instructed to monitor at home and if it remains elevated, notify PCP. Verbalized understanding.

## 2023-11-01 NOTE — Telephone Encounter (Signed)
 Pharmacy Patient Advocate Encounter   Received notification from CoverMyMeds that prior authorization for Ozempic  1 mg is required/requested.   Insurance verification completed.   The patient is insured through [facility] ADVANTAGE/RX ADVANCE .   Per test claim: PA required; PA submitted to above mentioned insurance via CoverMyMeds Key/confirmation #/EOC  [id number] Status is pending

## 2023-11-01 NOTE — Progress Notes (Signed)
 No further treatment indicated for patient's DCIS. Patient will proceed with observation. Will discontinue active navigation at this time but be available to the patient as needed in the future.   Patient qualifies for genetic testing. Reviewed benefits to testing. She is interested in proceeding. Referral placed.   Oncology Nurse Navigator Documentation     11/01/2023    1:00 PM  Oncology Nurse Navigator Flowsheets  Navigation Complete Date: 11/01/2023  Post Navigation: Continue to Follow Patient? No  Reason Not Navigating Patient: No Treatment, Observation Only  Navigator Location [facility]  Navigator Encounter Type Follow-up Appt  Patient Visit Type MedOnc  Treatment Phase Post-Tx Follow-up  Barriers/Navigation Needs No Barriers At This Time  Education Other  Interventions Education;Referrals  Acuity Level 1-No Barriers  Referrals Genetics  Education Method Verbal  Time Spent with Patient 15

## 2023-11-05 ENCOUNTER — Ambulatory Visit: Payer: PPO | Admitting: Family Medicine

## 2023-11-06 ENCOUNTER — Other Ambulatory Visit (HOSPITAL_COMMUNITY): Payer: Self-pay

## 2023-11-06 NOTE — Telephone Encounter (Signed)
 Pharmacy Patient Advocate Encounter  Received notification from [employer] that Prior Authorization for Ozempic  (1 MG/DOSE) 4MG /3ML pen-injectors has been APPROVED from 11/01/23 to 10/31/24. Spoke to pharmacy to process.Copay is $0.00.    PA #/Case ID/Reference #: [id number]

## 2023-11-08 ENCOUNTER — Inpatient Hospital Stay: Payer: PPO

## 2023-11-08 ENCOUNTER — Inpatient Hospital Stay (HOSPITAL_BASED_OUTPATIENT_CLINIC_OR_DEPARTMENT_OTHER): Payer: PPO | Admitting: Genetic Counselor

## 2023-11-08 ENCOUNTER — Encounter: Payer: Self-pay | Admitting: Genetic Counselor

## 2023-11-08 DIAGNOSIS — Z853 Personal history of malignant neoplasm of breast: Secondary | ICD-10-CM

## 2023-11-08 DIAGNOSIS — Z85820 Personal history of malignant melanoma of skin: Secondary | ICD-10-CM

## 2023-11-08 DIAGNOSIS — D0511 Intraductal carcinoma in situ of right breast: Secondary | ICD-10-CM

## 2023-11-08 DIAGNOSIS — C50012 Malignant neoplasm of nipple and areola, left female breast: Secondary | ICD-10-CM

## 2023-11-08 DIAGNOSIS — Z170 Estrogen receptor positive status [ER+]: Secondary | ICD-10-CM

## 2023-11-08 LAB — CMP (CANCER CENTER ONLY)
ALT: 19 U/L (ref 0–44)
AST: 22 U/L (ref 15–41)
Albumin: 4.1 g/dL (ref 3.5–5.0)
Alkaline Phosphatase: 81 U/L (ref 38–126)
Anion gap: 4 — ABNORMAL LOW (ref 5–15)
BUN: 12 mg/dL (ref 8–23)
CO2: 32 mmol/L (ref 22–32)
Calcium: 10.3 mg/dL (ref 8.9–10.3)
Chloride: 105 mmol/L (ref 98–111)
Creatinine: 0.81 mg/dL (ref 0.44–1.00)
GFR, Estimated: >60 mL/min (ref >60)
Glucose, Bld: 104 mg/dL — ABNORMAL HIGH (ref 70–99)
Potassium: 4 mmol/L (ref 3.5–5.1)
Sodium: 141 mmol/L (ref 135–145)
Total Bilirubin: 0.7 mg/dL (ref 0.0–1.2)
Total Protein: 7.3 g/dL (ref 6.5–8.1)

## 2023-11-08 LAB — CBC WITH DIFFERENTIAL (CANCER CENTER ONLY)
Abs Immature Granulocytes: 0.02 10*3/uL (ref 0.00–0.07)
Basophils Absolute: 0.1 10*3/uL (ref 0.0–0.1)
Basophils Relative: 1 %
Eosinophils Absolute: 0.2 10*3/uL (ref 0.0–0.5)
Eosinophils Relative: 2 %
HCT: 43 % (ref 36.0–46.0)
Hemoglobin: 15.1 g/dL — ABNORMAL HIGH (ref 12.0–15.0)
Immature Granulocytes: 0 %
Lymphocytes Relative: 37 %
Lymphs Abs: 3 10*3/uL (ref 0.7–4.0)
MCH: 30.6 pg (ref 26.0–34.0)
MCHC: 35.1 g/dL (ref 30.0–36.0)
MCV: 87.2 fL (ref 80.0–100.0)
Monocytes Absolute: 0.5 10*3/uL (ref 0.1–1.0)
Monocytes Relative: 6 %
Neutro Abs: 4.3 10*3/uL (ref 1.7–7.7)
Neutrophils Relative %: 54 %
Platelet Count: 253 10*3/uL (ref 150–400)
RBC: 4.93 MIL/uL (ref 3.87–5.11)
RDW: 13.1 % (ref 11.5–15.5)
WBC Count: 8 10*3/uL (ref 4.0–10.5)
nRBC: 0 % (ref 0.0–0.2)

## 2023-11-08 LAB — GENETIC SCREENING ORDER

## 2023-11-08 NOTE — Progress Notes (Signed)
 REFERRING PROVIDER: Timmy Maude SAUNDERS, MD [address] [place],  [place] [address]  PRIMARY PROVIDER:  Kennyth Worth HERO, MD  PRIMARY REASON FOR VISIT:  Encounter Diagnoses  Name Primary?   Ductal carcinoma in situ (DCIS) of right breast Yes   Personal history of breast cancer    Personal history of malignant melanoma     HISTORY OF PRESENT ILLNESS:   [name], a 89 y.o. female, was seen for a [facility] cancer genetics consultation at the request of Dr. Timmy due to a personal history of breast cancer.  [name] presents to clinic today to discuss the possibility of a hereditary predisposition to cancer, to discuss genetic testing, and to further clarify her future cancer risks, as well as potential cancer risks for family members.   In 2007, at the age of 65, [name] was diagnosed with invasive ductal carcinoma of the left breast (ER+/PR+/HER2-).  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  She also has a history of melanoma on her shoulder ~10 years ago, s/p excision.    RISK FACTORS:  Colonoscopy: yes; no reported hx of polyps.  Hysterectomy: no.  Ovaries intact: yes.  Dermatology screening: not currently.   Past Medical History:  Diagnosis Date   Arthritis    Cancer (HCC)    Diabetes mellitus without complication (HCC)    GERD (gastroesophageal reflux disease)    Hyperlipidemia    Hypertension    Kidney stones    Thyroid  disease     Past Surgical History:  Procedure Laterality Date   BACK SURGERY     BREAST BIOPSY  06/05/2023   MM RT RADIOACTIVE SEED LOC MAMMO GUIDE 06/05/2023 [facility] MAMMOGRAP[facility]   BREAST LUMPECTOMY WITH RADIOACTIVE SEED LOCALIZATION Right 06/06/2023   Procedure: RIGHT BREAST LUMPECTOMY WITH RADIOACTIVE SEED LOCALIZATION;  Surgeon: Belinda Cough, MD;  Location: [facility];  Service: General;  Laterality: Right;  LMA   BREAST SURGERY      MELANOMA EXCISION     TONSILLECTOMY      FAMILY HISTORY:  We obtained a detailed, 4-generation family history.  Significant diagnoses are listed below: Family History  Problem Relation Age of Onset   Cancer Mother        unk type; dx > 70; XRT in neck area   Other Other        brain tumor     [name] is unaware of previous family history of genetic testing for hereditary cancer risks. There is no reported Ashkenazi Jewish ancestry. There is no known consanguinity.  GENETIC COUNSELING ASSESSMENT: [name] is a 89 y.o. female with a personal history of two primary breast cancers which is somewhat suggestive of a hereditary cancer syndrome. We, therefore, discussed and recommended the following at today's visit.   DISCUSSION: We discussed that 5 - 10% of cancer is hereditary.  Most cases of hereditary breast cancer are associated with mutations in BRCA1/2.  There are other genes that can be associated with hereditary breast cancer or melanoma syndromes  We discussed that testing is beneficial for several reasons including knowing how to follow individuals for their cancer risks and understanding if other family members could be at risk for cancer and allowing them to undergo genetic testing.   We reviewed the characteristics, features and inheritance patterns of hereditary cancer syndromes. We also discussed genetic testing, including the appropriate family members  to test, the process of testing, insurance coverage and turn-around-time for results. We discussed the implications of a negative, positive, carrier and/or variant of uncertain significant result. We recommended [name] pursue genetic testing for a panel that includes genes associated with breast cancer, melanoma, and CNS tumors.  The CancerNext-Expanded gene panel offered by [facility] and includes sequencing, rearrangement, and RNA analysis for the following 76 genes: AIP, ALK, APC, ATM, AXIN2, BAP1, BARD1, BMPR1A,  BRCA1, BRCA2, BRIP1, CDC73, CDH1, CDK4, CDKN1B, CDKN2A, CEBPA, CHEK2, CTNNA1, DDX41, DICER1, ETV6, FH, FLCN, GATA2, LZTR1, MAX, MBD4, MEN1, MET, MLH1, MSH2, MSH3, MSH6, MUTYH, NF1, NF2, NTHL1, PALB2, PHOX2B, PMS2, POT1, PRKAR1A, PTCH1, PTEN, RAD51C, RAD51D, RB1, RET, RUNX1, SDHA, SDHAF2, SDHB, SDHC, SDHD, SMAD4, SMARCA4, SMARCB1, SMARCE1, STK11, SUFU, TMEM127, TP53, TSC1, TSC2, VHL, and WT1 (sequencing and deletion/duplication); EGFR, HOXB13, KIT, MITF, PDGFRA, POLD1, and POLE (sequencing only); EPCAM and GREM1 (deletion/duplication only).    Based on [name]'s personal history of two primary breast cancers, she meets NCCN criteria for genetic testing. She is the most informative relative to test. Despite that she meets criteria, she may still have an out of pocket cost. We discussed that if her out of pocket cost for testing is over $100, the laboratory should contact her and discuss the self-pay prices and/or patient pay assistance programs.    PLAN: After considering the risks, benefits, and limitations, [name] provided informed consent to pursue genetic testing and the blood sample was sent to [facility] for analysis of the CancerNext-Expanded +RNAinsight Panel. Results should be available within approximately 3 weeks, at which point they will be disclosed by telephone to [name], as will any additional recommendations warranted by these results. [name] will receive a summary of her genetic counseling visit and a copy of her results once available. This information will also be available in Epic.   [name] questions were answered to her satisfaction today. Our contact information was provided should additional questions or concerns arise. Thank you for the referral and allowing us  to share in the care of your patient.   Yeshaya Vath M. Nydia, MS, [facility] Genetic Counselor [email or web address] (P) [phone]   50 minutes were spent on the date of the encounter in  service to the patient including preparation, face-to-face consultation, documentation and care coordination.  The patient was seen alone.  Drs. Iruku, Gudena and/or Lanny were available to discuss this case as needed.    _______________________________________________________________________ For Office Staff:  Number of people involved in session: 1 Was an Intern/ student involved with case: no

## 2023-11-15 ENCOUNTER — Ambulatory Visit: Payer: PPO | Admitting: Family Medicine

## 2023-11-16 ENCOUNTER — Encounter: Payer: Self-pay | Admitting: Family Medicine

## 2023-11-16 ENCOUNTER — Ambulatory Visit (INDEPENDENT_AMBULATORY_CARE_PROVIDER_SITE_OTHER): Payer: PPO | Admitting: Family Medicine

## 2023-11-16 VITALS — BP 135/72 | HR 77 | Temp 97.8°F | Ht 62.0 in | Wt 130.2 lb

## 2023-11-16 DIAGNOSIS — E785 Hyperlipidemia, unspecified: Secondary | ICD-10-CM | POA: Diagnosis not present

## 2023-11-16 DIAGNOSIS — I152 Hypertension secondary to endocrine disorders: Secondary | ICD-10-CM

## 2023-11-16 DIAGNOSIS — E1159 Type 2 diabetes mellitus with other circulatory complications: Secondary | ICD-10-CM | POA: Diagnosis not present

## 2023-11-16 DIAGNOSIS — E119 Type 2 diabetes mellitus without complications: Secondary | ICD-10-CM

## 2023-11-16 DIAGNOSIS — E1169 Type 2 diabetes mellitus with other specified complication: Secondary | ICD-10-CM

## 2023-11-16 LAB — POCT GLYCOSYLATED HEMOGLOBIN (HGB A1C): Hemoglobin A1C: 6.4 % — AB (ref 4.0–5.6)

## 2023-11-16 MED ORDER — SEMAGLUTIDE (2 MG/DOSE) 8 MG/3ML ~~LOC~~ SOPN
2.0000 mg | PEN_INJECTOR | SUBCUTANEOUS | 0 refills | Status: DC
  Filled 2023-12-31 – 2024-01-09 (×3): qty 3, 28d supply, fill #0

## 2023-11-16 NOTE — Progress Notes (Signed)
   [name] is a 89 y.o. female who presents today for an office visit.  Assessment/Plan:  Chronic Problems Addressed Today: Diabetes mellitus without complication (HCC) A1c stable 6.4.  She would like to work on reducing her postprandial sugars.  She is getting a few readings into the 180s.  She is tolerating her current dose of Ozempic well.  Discussed that she is okay where she is however would be reasonable for [name] to go up to the neck step in dose.  We will increase to 2 mg weekly.  She is aware of potential side effects.  She will continue to monitor her sugar at home.  She will follow-up with [name] in a few weeks via MyChart.  Recheck A1c in 3 months.  Hypertension associated with diabetes (HCC) Initially elevated however 135/72 on recheck.  Previously at goal. Continue with losartan 50 mg daily.     Subjective:  HPI:  See Assessment / plan for status of chronic conditions.  Patient is here today for follow-up.  Saw her 3 months ago.  At that time A1c well-controlled 6.1.  We stopped glimepiride and continued Ozempic 1 mg weekly. She has done well with this.  She has had some sugars in the upper 180s and would like to increase the dose of her Ozempic.  Tolerating well without any significant side effects.       Objective:  Physical Exam: BP 135/72   Pulse 77   Temp 97.8 F (36.6 C) (Temporal)   Ht 5\' 2"  (1.575 m)   Wt 130 lb 3.2 oz (59.1 kg)   SpO2 100%   BMI 23.81 kg/m   Wt Readings from Last 3 Encounters:  11/16/23 130 lb 3.2 oz (59.1 kg)  11/01/23 133 lb 1.9 oz (60.4 kg)  09/12/23 134 lb (60.8 kg)  Gen: No acute distress, resting comfortably HEENT: TMs clear bilaterally.  Small amount of cerumen in right EAC though nonocclusive. CV: Regular rate and rhythm with no murmurs appreciated Pulm: Normal work of breathing, clear to auscultation bilaterally with no crackles, wheezes, or rhonchi Neuro: Grossly normal, moves all extremities Psych: Normal affect and thought  content      [name] [name], MD 11/16/2023 3:00 PM

## 2023-11-16 NOTE — Patient Instructions (Signed)
It was very nice to see you today!  We will increase your Ozempic to 2 mg weekly.  Let us know if you have any side effects with this.  Please keep an eye on your blood pressure and let us know if it is persistently elevated.  Return in about 3 months (around 02/14/2024) for Follow Up.   Take care, Dr [name]  PLEASE NOTE:  If you had any lab tests, please let us know if you have not heard back within a few days. You may see your results on mychart before we have a chance to review them but we will give you a call once they are reviewed by [name].   If we ordered any referrals today, please let us know if you have not heard from their office within the next week.   If you had any urgent prescriptions sent in today, please check with the pharmacy within an hour of our visit to make sure the prescription was transmitted appropriately.   Please try these tips to maintain a healthy lifestyle:  Eat at least 3 REAL meals and 1-2 snacks per day.  Aim for no more than 5 hours between eating.  If you eat breakfast, please do so within one hour of getting up.   Each meal should contain half fruits/vegetables, one quarter protein, and one quarter carbs (no bigger than a computer mouse)  Cut down on sweet beverages. This includes juice, soda, and sweet tea.   Drink at least 1 glass of water with each meal and aim for at least 8 glasses per day  Exercise at least 150 minutes every week.

## 2023-11-16 NOTE — Assessment & Plan Note (Signed)
A1c stable 6.4.  She would like to work on reducing her postprandial sugars.  She is getting a few readings into the 180s.  She is tolerating her current dose of Ozempic well.  Discussed that she is okay where she is however would be reasonable for [name] to go up to the neck step in dose.  We will increase to 2 mg weekly.  She is aware of potential side effects.  She will continue to monitor her sugar at home.  She will follow-up with [name] in a few weeks via MyChart.  Recheck A1c in 3 months.

## 2023-11-16 NOTE — Addendum Note (Signed)
Addended by: Dyann Kief on: 11/16/2023 03:34 PM   Modules accepted: Orders

## 2023-11-16 NOTE — Assessment & Plan Note (Signed)
Initially elevated however 135/72 on recheck.  Previously at goal. Continue with losartan 50 mg daily.

## 2023-11-17 LAB — MICROALBUMIN / CREATININE URINE RATIO
Creatinine, Urine: 60 mg/dL (ref 20–275)
Microalb Creat Ratio: 120 mg/g{creat} — ABNORMAL HIGH (ref <30)
Microalb, Ur: 7.2 mg/dL

## 2023-11-19 DIAGNOSIS — C50912 Malignant neoplasm of unspecified site of left female breast: Secondary | ICD-10-CM | POA: Diagnosis not present

## 2023-11-19 DIAGNOSIS — C50911 Malignant neoplasm of unspecified site of right female breast: Secondary | ICD-10-CM | POA: Diagnosis not present

## 2023-11-20 ENCOUNTER — Encounter: Payer: Self-pay | Admitting: Genetic Counselor

## 2023-11-20 ENCOUNTER — Telehealth: Payer: Self-pay | Admitting: Genetic Counselor

## 2023-11-20 ENCOUNTER — Encounter: Payer: Self-pay | Admitting: Family Medicine

## 2023-11-20 DIAGNOSIS — Z1379 Encounter for other screening for genetic and chromosomal anomalies: Secondary | ICD-10-CM | POA: Insufficient documentation

## 2023-11-20 NOTE — Telephone Encounter (Signed)
Contacted patient in attempt to disclose results of genetic testing.  LVM with contact information requesting a call back.  

## 2023-11-20 NOTE — Progress Notes (Signed)
She has some protein in urine.  This is due to the diabetes.  We will continue to monitor.  Do not need to make any changes to treatment plan at this time.

## 2023-11-26 ENCOUNTER — Ambulatory Visit: Payer: Self-pay | Admitting: Genetic Counselor

## 2023-11-26 DIAGNOSIS — D0511 Intraductal carcinoma in situ of right breast: Secondary | ICD-10-CM

## 2023-11-26 DIAGNOSIS — Z853 Personal history of malignant neoplasm of breast: Secondary | ICD-10-CM

## 2023-11-26 DIAGNOSIS — Z85820 Personal history of malignant melanoma of skin: Secondary | ICD-10-CM

## 2023-11-26 DIAGNOSIS — Z1379 Encounter for other screening for genetic and chromosomal anomalies: Secondary | ICD-10-CM

## 2023-11-26 NOTE — Telephone Encounter (Signed)
Disclosed negative genetics.

## 2023-11-26 NOTE — Telephone Encounter (Signed)
Second attempt at disclosing negative genetics.  LVM

## 2023-11-26 NOTE — Progress Notes (Signed)
HPI:   [name] was previously seen in the [facility] due to a personal history of breast cancer and concerns regarding a hereditary predisposition to cancer.    [name] recent genetic test results were disclosed to her by telephone. These results and recommendations are discussed in more detail below.  CANCER HISTORY:  In 2007, at the age of 80, [name] was diagnosed with invasive ductal carcinoma of the left breast (ER+/PR+/HER2-).  In 2024, at the age of 63, 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  She also has a history of melanoma on her shoulder ~10 years ago, s/p excision.     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  Report date is 11/19/2023.   The CancerNext-Expanded gene panel offered by [facility] and includes sequencing, rearrangement, and RNA analysis for the following 76 genes: AIP, ALK, APC, ATM, AXIN2, BAP1, BARD1, BMPR1A, BRCA1, BRCA2, BRIP1, CDC73, CDH1, CDK4, CDKN1B, CDKN2A, CEBPA, CHEK2, CTNNA1, DDX41, DICER1, ETV6, FH, FLCN, GATA2, LZTR1, MAX, MBD4, MEN1, MET, MLH1, MSH2, MSH3, MSH6, MUTYH, NF1, NF2, NTHL1, PALB2, PHOX2B, PMS2, POT1, PRKAR1A, PTCH1, PTEN, RAD51C, RAD51D, RB1, RET, RUNX1, SDHA, SDHAF2, SDHB, SDHC, SDHD, SMAD4, SMARCA4, SMARCB1, SMARCE1, STK11, SUFU, TMEM127, TP53, TSC1, TSC2, VHL, and WT1 (sequencing and deletion/duplication); EGFR, HOXB13, KIT, MITF, PDGFRA, POLD1, and POLE (sequencing only); EPCAM and GREM1 (deletion/duplication only).      FAMILY HISTORY:  We obtained a detailed, 4-generation family history.  Significant diagnoses are listed below:      Family History  Problem Relation Age of  Onset   Cancer Mother          unk type; dx > 70; XRT in neck area   Other Other          brain tumor       [name] is unaware of previous family history of genetic testing for hereditary cancer risks. There is no reported Ashkenazi Jewish ancestry. There is no known consanguinity.    GENETIC TEST RESULTS:  The [facility] CancerNext-Expanded +RNAinsight Panel found no pathogenic mutations.   The CancerNext-Expanded gene panel offered by [facility] and includes sequencing, rearrangement, and RNA analysis for the following 76 genes: AIP, ALK, APC, ATM, AXIN2, BAP1, BARD1, BMPR1A, BRCA1, BRCA2, BRIP1, CDC73, CDH1, CDK4, CDKN1B, CDKN2A, CEBPA, CHEK2, CTNNA1, DDX41, DICER1, ETV6, FH, FLCN, GATA2, LZTR1, MAX, MBD4, MEN1, MET, MLH1, MSH2, MSH3, MSH6, MUTYH, NF1, NF2, NTHL1, PALB2, PHOX2B, PMS2, POT1, PRKAR1A, PTCH1, PTEN, RAD51C, RAD51D, RB1, RET, RUNX1, SDHA, SDHAF2, SDHB, SDHC, SDHD, SMAD4, SMARCA4, SMARCB1, SMARCE1, STK11, SUFU, TMEM127, TP53, TSC1, TSC2, VHL, and WT1 (sequencing and deletion/duplication); EGFR, HOXB13, KIT, MITF, PDGFRA, POLD1, and POLE (sequencing only); EPCAM and GREM1 (deletion/duplication only).   The test report has been scanned into EPIC and is located under the Molecular Pathology section of the Results Review tab.  A portion of the result report is included below for reference. Genetic testing reported out on November 19, 2023.     Even though a pathogenic variant was not identified, possible explanations for the cancer in the family may include: There may be no hereditary risk for cancer in the family. The cancers in Ms. Kantz and/or her family may be sporadic/familial or due  to other genetic and environmental factors.  Most cancer is not hereditary.  There may be a gene mutation in one of these genes that current testing methods cannot detect but that chance is small. There could be another gene that has not yet been discovered, or that we have not yet tested,  that is responsible for the cancer diagnoses in the family.    Therefore, it is important to remain in touch with cancer genetics in the future so that we can continue to offer Ms. Aultman the most up to date genetic testing.   ADDITIONAL GENETIC TESTING:   [name] genetic testing was fairly extensive.  If there are additional relevant genes identified to increase cancer risk that can be analyzed in the future, we would be happy to discuss and coordinate this testing at that time.    CANCER SCREENING RECOMMENDATIONS:  [name] test result is considered negative (normal).  This means that we have not identified a hereditary cause for her personal history of breast cancer at this time.   An individual's cancer risk and medical management are not determined by genetic test results alone. Overall cancer risk assessment incorporates additional factors, including personal medical history, family history, and any available genetic information that may result in a personalized plan for cancer prevention and surveillance. Therefore, it is recommended she continue to follow the cancer management and screening guidelines provided by her oncology and primary healthcare provider.  RECOMMENDATIONS FOR FAMILY MEMBERS:   Since she did not inherit a identifiable mutation in a cancer predisposition gene included on this panel, her children could not have inherited a known mutation from her in one of these genes. Individuals in this family might be at some increased risk of developing cancer, over the general population risk, due to the family history of cancer.  Individuals in the family should notify their providers of the family history of cancer. We recommend women in this family have a yearly mammogram beginning at age 70, or 47 years younger than the earliest onset of cancer, an annual clinical breast exam, and perform monthly breast self-exams.  Risk models that take into account family history and  hormonal history may be helpful in determining appropriate breast cancer screening options for family members. Relatives should speak with their providers about regular skin checks.    FOLLOW-UP:  Cancer genetics is a rapidly advancing field and it is possible that new genetic tests will be appropriate for her and/or her family members in the future. We encourage Ms. Plott to remain in contact with cancer genetics, so we can update her personal and family histories and let her know of advances in cancer genetics that may benefit this family.   Our contact number was provided.  They are welcome to call us at anytime with additional questions or concerns.   Galo Sayed M. Rennie Plowman, MS, [facility] Genetic Counselor [email or web address] (P) [phone]

## 2023-11-28 DIAGNOSIS — E212 Other hyperparathyroidism: Secondary | ICD-10-CM | POA: Diagnosis not present

## 2023-11-29 LAB — PTH, INTACT AND CALCIUM
Calcium: 11 mg/dL — ABNORMAL HIGH (ref 8.7–10.3)
PTH: 24 pg/mL (ref 15–65)

## 2023-12-03 ENCOUNTER — Encounter: Payer: Self-pay | Admitting: "Endocrinology

## 2023-12-03 ENCOUNTER — Ambulatory Visit (INDEPENDENT_AMBULATORY_CARE_PROVIDER_SITE_OTHER): Payer: PPO | Admitting: "Endocrinology

## 2023-12-03 ENCOUNTER — Telehealth: Payer: Self-pay | Admitting: "Endocrinology

## 2023-12-03 VITALS — BP 118/58 | HR 92 | Ht 62.0 in | Wt 131.8 lb

## 2023-12-03 DIAGNOSIS — E559 Vitamin D deficiency, unspecified: Secondary | ICD-10-CM | POA: Diagnosis not present

## 2023-12-03 DIAGNOSIS — M8589 Other specified disorders of bone density and structure, multiple sites: Secondary | ICD-10-CM

## 2023-12-03 DIAGNOSIS — E212 Other hyperparathyroidism: Secondary | ICD-10-CM

## 2023-12-03 MED ORDER — CINACALCET HCL 30 MG PO TABS: 30.0000 mg | ORAL_TABLET | Freq: Two times a day (BID) | ORAL | 1 refills | Status: DC

## 2023-12-03 NOTE — Telephone Encounter (Signed)
Pt needs DEXA before next visit

## 2023-12-03 NOTE — Progress Notes (Signed)
12/03/2023, 1:31 PM       Endocrinology follow-up note   [name] is a 89 y.o.-year-old female, here for follow-up of hypercalcemia related to hyperparathyroidism. PMD: her  Ardith Dark, MD    Past Medical History:  Diagnosis Date   Arthritis    Cancer [facility])    Diabetes mellitus without complication (HCC)    GERD (gastroesophageal reflux disease)    Hyperlipidemia    Hypertension    Kidney stones    Thyroid disease     Past Surgical History:  Procedure Laterality Date   BACK SURGERY     BREAST BIOPSY  06/05/2023   MM RT RADIOACTIVE SEED LOC MAMMO GUIDE 06/05/2023 [facility] MAMMOGRAP[facility]   BREAST LUMPECTOMY WITH RADIOACTIVE SEED LOCALIZATION Right 06/06/2023   Procedure: RIGHT BREAST LUMPECTOMY WITH RADIOACTIVE SEED LOCALIZATION;  Surgeon: Manus Rudd, MD;  Location:  SURGERY CENTER;  Service: General;  Laterality: Right;  LMA   BREAST SURGERY     MELANOMA EXCISION     TONSILLECTOMY      Social History   Tobacco Use   Smoking status: Former    Current packs/day: 0.00    Average packs/day: 1 pack/day for 15.0 years (15.0 ttl pk-yrs)    Types: Cigarettes    Start date: 09/14/1955    Quit date: 07/14/1969    Years since quitting: 54.4   Smokeless tobacco: Never   Tobacco comments:    quit 40 years ago  Vaping Use   Vaping status: Never Used  Substance Use Topics   Alcohol use: Never    Alcohol/week: 0.0 standard drinks of alcohol   Drug use: Never    Family History  Problem Relation Age of Onset   Cancer Mother        unk type; dx > 70; XRT in neck area   Hypertension Father    Other Other        brain tumor    Outpatient Encounter Medications as of 12/03/2023  Medication Sig   blood glucose meter kit and supplies KIT Dispense based on patient and insurance preference. Use up to four times daily as directed. (FOR ICD-9 250.00, 250.01).   Cholecalciferol (VITAMIN D-3) 1000 UNITS  CAPS Take 2,000 Units by mouth 2 (two) times daily.   cinacalcet (SENSIPAR) 30 MG tablet Take 1 tablet (30 mg total) by mouth 2 (two) times daily with a meal.   losartan (COZAAR) 50 MG tablet TAKE 1 TABLET BY MOUTH EVERY DAY   Multiple Vitamins-Minerals (CENTRUM SILVER 50+WOMEN PO) Take 1 tablet by mouth daily.   omeprazole (PRILOSEC) 20 MG capsule TAKE 1 CAPSULE(20 MG) BY MOUTH DAILY   ONETOUCH VERIO test strip USE TO TEST BLOOD SUGAR FOUR TIMES DAILY.   rosuvastatin (CRESTOR) 10 MG tablet Take 1 tablet (10 mg total) by mouth daily.   Semaglutide, 2 MG/DOSE, 8 MG/3ML SOPN Inject 2 mg as directed once a week.   sodium chloride (MURO 128) 2 % ophthalmic solution Place 1 drop into both eyes 2 (two) times daily.   vitamin C (ASCORBIC ACID) 250 MG tablet Take 250 mg by mouth daily.   [  DISCONTINUED] cinacalcet (SENSIPAR) 30 MG tablet Take 1 tablet (30 mg total) by mouth daily with breakfast.   No facility-administered encounter medications on file as of 12/03/2023.    No Known Allergies   HPI  [name] was diagnosed with hypercalcemia in May 2020.   Prior to her last visit, her work-up to help determine the hypercalcemia secondary to primary hyperparathyroidism.    -Since she is not a surgical candidate, she was started on Sensipar,  currently on 30 mg p.o. every  day with breakfast.  She continues to tolerate this medication.  Her previsit labs show higher calcium at 11 mg per DL, increasing from 21.3 during her last visit.  Her PTH is controlled at 24.  -Her last bone density in June 2023 showed osteopenia.  She does not report any interval falls or fragility fractures.    She has diabetes controlled with recent A1c of 6.4% following up with her PMD.  She remains on Ozempic 2 mg subcutaneously weekly. She also has hyperlipidemia uncontrolled on Crestor 10 mg.  She is known to have recurrent nephrolithiasis.  No history of CKD.   she is not on HCTZ or other thiazide therapy.  She is  on vitamin D supplement-4000 units daily.  She is not on calcium supplements. she eats dairy and green, leafy, vegetables on average amounts.  she does not have a family history of hypercalcemia, pituitary tumors, thyroid cancer, or osteoporosis.  -On history, she lost 2 inches of height over the years.    ROS: Limited as above.  PE: BP (!) 118/58   Pulse 92   Ht 5\' 2"  (1.575 m)   Wt 131 lb 12.8 oz (59.8 kg)   BMI 24.11 kg/m , Body mass index is 24.11 kg/m. Wt Readings from Last 3 Encounters:  12/03/23 131 lb 12.8 oz (59.8 kg)  11/16/23 130 lb 3.2 oz (59.1 kg)  11/01/23 133 lb 1.9 oz (60.4 kg)      CMP     Component Value Date/Time   NA 141 11/08/2023 1407   NA 143 05/28/2023 0947   NA 145 02/15/2017 1333   NA 144 08/12/2015 1419   K 4.0 11/08/2023 1407   K 4.4 02/15/2017 1333   K 4.2 08/12/2015 1419   CL 105 11/08/2023 1407   CL 104 02/15/2017 1333   CO2 32 11/08/2023 1407   CO2 28 02/15/2017 1333   CO2 29 08/12/2015 1419   GLUCOSE 104 (H) 11/08/2023 1407   GLUCOSE 142 (H) 02/15/2017 1333   BUN 12 11/08/2023 1407   BUN 6 (L) 05/28/2023 0947   BUN 13 02/15/2017 1333   BUN 11.8 08/12/2015 1419   CREATININE 0.81 11/08/2023 1407   CREATININE 0.82 03/24/2020 1122   CREATININE 0.8 08/12/2015 1419   CALCIUM 11.0 (H) 11/28/2023 1308   CALCIUM 10.2 02/15/2017 1333   CALCIUM 11.1 (H) 08/12/2015 1419   PROT 7.3 11/08/2023 1407   PROT 7.4 05/28/2023 0947   PROT 7.5 02/15/2017 1333   PROT 7.2 08/12/2015 1419   ALBUMIN 4.1 11/08/2023 1407   ALBUMIN 4.4 05/28/2023 0947   ALBUMIN 150 MG/L 07/15/2019 1130   ALBUMIN 3.9 08/12/2015 1419   AST 22 11/08/2023 1407   AST 26 08/12/2015 1419   ALT 19 11/08/2023 1407   ALT 39 02/15/2017 1333   ALT 32 08/12/2015 1419   ALKPHOS 81 11/08/2023 1407   ALKPHOS 76 02/15/2017 1333   ALKPHOS 74 08/12/2015 1419   BILITOT 0.7 11/08/2023 1407  BILITOT 0.52 08/12/2015 1419   GFRNONAA >60 11/08/2023 1407   GFRNONAA 67 03/24/2020 1122    GFRAA 77 03/24/2020 1122     Diabetic Labs (most recent): Lab Results  Component Value Date   HGBA1C 6.4 (A) 11/16/2023   HGBA1C 6.1 (A) 06/18/2023   HGBA1C 6.6 (A) 03/01/2023   MICROALBUR 7.2 11/16/2023   MICROALBUR 292 12/10/2017   MICROALBUR 12 12/12/2016     Lipid Panel ( most recent) Lipid Panel     Component Value Date/Time   CHOL 114 07/25/2023 0858   TRIG 149.0 07/25/2023 0858   HDL 33.20 (L) 07/25/2023 0858   CHOLHDL 3 07/25/2023 0858   VLDL 29.8 07/25/2023 0858   LDLCALC 51 07/25/2023 0858   LDLCALC 86 11/06/2019 0836   LDLDIRECT 91.0 03/18/2021 1039      Lab Results  Component Value Date   TSH 6.29 (H) 04/18/2021   TSH 5.27 (H) 03/18/2021   TSH 3.96 11/21/2019   TSH 4.00 03/17/2019   TSH 4.36 03/12/2018   TSH 2.93 03/12/2017   FREET4 0.58 (L) 04/18/2021      Assessment: 1. Hypercalcemia / Hyperparathyroidism  Plan: See notes from prior visits.  She returns with a higher calcium of 11 point milligrams per DL associated with controlled PTH of 24.  She is not a surgical candidate.  She would benefit from a higher dose of Sensipar.  I discussed and agreed on Sensipar to 30 mg p.o. daily with breakfast and supper.     - Patient also  has vitamin D deficiency on supplement-advised to continue vitamin D3 4000 units daily.  -PTH RP is normal, malignancy related hypercalcemia unlikely. -There seems to be a complication from hypercalcemia/hypocalcemia with 3 mm nonobstructive right sided nephrolithiasis.  no history of   osteoporosis,fragility fractures. No abdominal pain, no major mood disorders, no bone pain-  Her most recent DXA showed osteopenia, will not need bisphosphonates. She will have repeat bone density before her next visit in June 2025. -She is not a surgical candidate for hyperparathyroidism and wishes to avoid surgery.     She has uncontrolled type 2 diabetes, recent A1c of 6.4%, addressing with her PMD.  She is advised to maintain close  follow-up with her PCP.  I spent  22  minutes in the care of the patient today including review of labs from Thyroid Function, CMP, and other relevant labs ; imaging/biopsy records (current and previous including abstractions from other facilities); face-to-face time discussing  her lab results and symptoms, medications doses, her options of short and long term treatment based on the latest standards of care / guidelines;   and documenting the encounter.  Delmar Landau Bansal  participated in the discussions, expressed understanding, and voiced agreement with the above plans.  All questions were answered to her satisfaction. she is encouraged to contact clinic should she have any questions or concerns prior to her return visit.   - Return in about 6 months (around 06/01/2024) for F/U with Pre-visit Labs, DXA Scan B4 NV.   Marquis Lunch, MD [facility] [address] [place], [place] [address] Phone: [phone]  Fax: [phone]    This note was partially dictated with voice recognition software. Similar sounding words can be transcribed inadequately or may not  be corrected upon review.  12/03/2023, 1:31 PM

## 2023-12-04 ENCOUNTER — Telehealth: Payer: Self-pay | Admitting: Family Medicine

## 2023-12-04 NOTE — Telephone Encounter (Signed)
Received faxed document medical clearance form , to be filled out by provider. Patient requested to send it back via Call Patient to pick up . Document is located in providers tray at front office.Please advise

## 2023-12-04 NOTE — Telephone Encounter (Signed)
Sent to [place]

## 2023-12-05 ENCOUNTER — Encounter: Payer: Self-pay | Admitting: "Endocrinology

## 2023-12-05 DIAGNOSIS — M8589 Other specified disorders of bone density and structure, multiple sites: Secondary | ICD-10-CM

## 2023-12-06 ENCOUNTER — Other Ambulatory Visit: Payer: Self-pay

## 2023-12-06 MED ORDER — CINACALCET HCL 30 MG PO TABS: 30.0000 mg | ORAL_TABLET | Freq: Two times a day (BID) | ORAL | 1 refills | Status: DC

## 2023-12-06 NOTE — Telephone Encounter (Signed)
Placed at PCP office to be reviewed  

## 2023-12-11 NOTE — Telephone Encounter (Signed)
LVM form ready to be pick up at our front office   Form placed at front office folder Copy placed to be scan in patient chart

## 2023-12-12 ENCOUNTER — Other Ambulatory Visit: Payer: Self-pay

## 2023-12-12 MED ORDER — CINACALCET HCL 30 MG PO TABS: 30.0000 mg | ORAL_TABLET | Freq: Two times a day (BID) | ORAL | 1 refills | Status: DC

## 2023-12-13 ENCOUNTER — Telehealth: Payer: Self-pay | Admitting: *Deleted

## 2023-12-13 NOTE — Telephone Encounter (Signed)
Copied from CRM [id number]. Topic: General - Other >> Dec 13, 2023 12:59 PM Sonny Dandy B wrote: Reason for CRM: Meridith from [facility] called to follow up on fax she sent for medical clearance on 12/05/23 please call her back at [phone]  Called # [phone] no answer  LVM In to patient form was faxed and a copy placed in front office to be pick up  [facility]

## 2023-12-17 ENCOUNTER — Telehealth: Payer: Self-pay

## 2023-12-17 NOTE — Telephone Encounter (Signed)
Copied from CRM [id number]. Topic: Clinical - Medication Question >> Dec 14, 2023 10:03 AM Sonny Dandy B wrote: Reason for CRM: Pt called to speak with DR, Parkers , nurse pt requested a call back regarding medical clearance. She asked if the  medical clearance from the [facility] can be mailed to her once completed. Please give the pt a call at [phone]  patient form was faxed and a copy placed in front office to be pick up.  Christy Gentles

## 2023-12-18 ENCOUNTER — Telehealth: Payer: Self-pay

## 2023-12-18 NOTE — Telephone Encounter (Signed)
Received call from pt's insurance company her Rx for Cinacalcet needs prior auth.

## 2023-12-19 ENCOUNTER — Other Ambulatory Visit: Payer: Self-pay | Admitting: Family Medicine

## 2023-12-19 ENCOUNTER — Telehealth: Payer: Self-pay

## 2023-12-19 ENCOUNTER — Other Ambulatory Visit (HOSPITAL_COMMUNITY): Payer: Self-pay

## 2023-12-19 NOTE — Telephone Encounter (Signed)
Last Fill: 07/29/20  Last OV: 11/16/23 Next OV: 02/19/24  Routing to provider for review/authorization.

## 2023-12-19 NOTE — Telephone Encounter (Signed)
Copied from CRM [id number]. Topic: Clinical - Medication Refill >> Dec 19, 2023  3:59 PM Clayton Bibles wrote: Most Recent Primary Care Visit:  Provider: Ardith Dark  Department: [facility]  Visit Type: OFFICE VISIT  Date: 11/16/2023  Medication: blood glucose meter kit and supplies KIT - She needs a new machine and supplies  Has the patient contacted their pharmacy? No (Agent: If no, request that the patient contact the pharmacy for the refill. If patient does not wish to contact the pharmacy document the reason why and proceed with request.) (Agent: If yes, when and what did the pharmacy advise?)  Is this the correct pharmacy for this prescription? Yes - Please send to New Pharmacy; Tarri Fuller If no, delete pharmacy and type the correct one.  This is the patient's preferred pharmacy:  [facility] [address] [place] [address] Phone: [phone] Fax: [phone]   Has the prescription been filled recently? No  Is the patient out of the medication? No  Has the patient been seen for an appointment in the last year OR does the patient have an upcoming appointment? Yes  Can we respond through MyChart? No  Agent: Please be advised that Rx refills may take up to 3 business days. We ask that you follow-up with your pharmacy.

## 2023-12-19 NOTE — Telephone Encounter (Signed)
Pharmacy Patient Advocate Encounter   Received notification from Pt Calls Messages that prior authorization for Cinacalet is required/requested.   Insurance verification completed.   The patient is insured through [facility] ADVANTAGE/RX ADVANCE .   Per test claim: PA required; PA started via CoverMyMeds. KEY [id number] . Please see clinical question(s) below that I am not finding the answer to in her chart and advise.

## 2023-12-20 ENCOUNTER — Other Ambulatory Visit (HOSPITAL_COMMUNITY): Payer: Self-pay

## 2023-12-20 MED ORDER — BLOOD GLUCOSE MONITOR KIT: PACK | 0 refills | Status: DC

## 2023-12-20 NOTE — Telephone Encounter (Signed)
The insurance company called and said to please call them at [phone] option 2 (lokesh)

## 2023-12-20 NOTE — Telephone Encounter (Addendum)
Please provide clinical rational for this PA. The insurance requires documentation of previously trialed medications and reasoning as to why they aren't appropriate. (The PA team isn't able to determine this from her notes)  Prior Authorization form/request asks a question that requires your assistance. Please see the question below and advise accordingly. The PA will not be submitted until the necessary information is received.   Does the enrollee have any other drug history for treatment of the condition(s) requiring the requested drug? Please provide details of enrollee's drug history for treatment of the condition(s) requiring the requested drug: Please include dates of trial and results.  Any FDA NOTED CONTRAINDICATIONS to the requested drug?  Any concern for a DRUG INTERACTION with the addition of the requested drug to the enrollee's current drug regimen?  If the answer to either of the two questions noted above is yes, please 1) explain issue, 2) discuss the benefits vs. potential risks despite the noted concern, and 3) monitoring plan to ensure safety:  If the enrollee is over the age of 74, do you feel that the benefits of treatment with the requested drug outweigh the potential risks in this elderly patient?  Then, we much select one of the following:   Alternate drug(s) contraindicated or previously tried, but with adverse outcome, e.g., toxicity, allergy, or therapeutic failure  Patient is stable on current drug(s); high risk of significant adverse clinical outcome with medication change   Medical need for different dosage form and / or higher dosage  Request for formulary tier exception  Other  Any additional rational?

## 2023-12-21 NOTE — Telephone Encounter (Signed)
 approved

## 2023-12-25 ENCOUNTER — Telehealth: Payer: Self-pay

## 2023-12-25 NOTE — Telephone Encounter (Signed)
 Tried to return call to pt, did not receive an answer and was unable to leave a message.

## 2023-12-26 ENCOUNTER — Other Ambulatory Visit (HOSPITAL_COMMUNITY): Payer: Self-pay

## 2023-12-26 ENCOUNTER — Other Ambulatory Visit: Payer: Self-pay

## 2023-12-26 MED ORDER — CINACALCET HCL 30 MG PO TABS
30.0000 mg | ORAL_TABLET | Freq: Two times a day (BID) | ORAL | 1 refills | Status: DC
Start: 2023-12-26 — End: 2024-03-05
  Filled 2023-12-26 – 2024-01-09 (×4): qty 180, 90d supply, fill #0
  Filled 2024-01-09: qty 60, 30d supply, fill #0

## 2023-12-31 ENCOUNTER — Other Ambulatory Visit (HOSPITAL_COMMUNITY): Payer: Self-pay

## 2023-12-31 MED ORDER — CINACALCET HCL 30 MG PO TABS
30.0000 mg | ORAL_TABLET | Freq: Two times a day (BID) | ORAL | 1 refills | Status: DC
  Filled 2024-01-02: qty 180, 90d supply, fill #0

## 2023-12-31 MED ORDER — CINACALCET HCL 30 MG PO TABS
30.0000 mg | ORAL_TABLET | Freq: Every day | ORAL | 1 refills | Status: DC
  Filled 2024-01-07 – 2024-01-09 (×2): qty 90, 90d supply, fill #0

## 2023-12-31 MED ORDER — OZEMPIC (1 MG/DOSE) 4 MG/3ML ~~LOC~~ SOPN
1.0000 mg | PEN_INJECTOR | SUBCUTANEOUS | 1 refills | Status: DC
  Filled 2023-12-31: qty 6, 56d supply, fill #0

## 2023-12-31 MED FILL — Glucose Blood Test Strip: 25 days supply | Qty: 100 | Fill #0 | Status: CN

## 2024-01-01 ENCOUNTER — Other Ambulatory Visit: Payer: Self-pay

## 2024-01-01 ENCOUNTER — Other Ambulatory Visit (HOSPITAL_COMMUNITY): Payer: Self-pay

## 2024-01-01 MED FILL — Glucose Blood Test Strip: 25 days supply | Qty: 100 | Fill #0 | Status: CN

## 2024-01-02 ENCOUNTER — Encounter (HOSPITAL_COMMUNITY): Payer: Self-pay

## 2024-01-02 ENCOUNTER — Other Ambulatory Visit (HOSPITAL_COMMUNITY): Payer: Self-pay

## 2024-01-03 ENCOUNTER — Encounter (HOSPITAL_COMMUNITY): Payer: Self-pay

## 2024-01-03 ENCOUNTER — Other Ambulatory Visit: Payer: Self-pay

## 2024-01-03 ENCOUNTER — Other Ambulatory Visit (HOSPITAL_COMMUNITY): Payer: Self-pay

## 2024-01-04 ENCOUNTER — Other Ambulatory Visit: Payer: Self-pay

## 2024-01-04 ENCOUNTER — Other Ambulatory Visit (HOSPITAL_COMMUNITY): Payer: Self-pay

## 2024-01-07 ENCOUNTER — Other Ambulatory Visit (HOSPITAL_BASED_OUTPATIENT_CLINIC_OR_DEPARTMENT_OTHER): Payer: Self-pay

## 2024-01-07 ENCOUNTER — Other Ambulatory Visit: Payer: Self-pay

## 2024-01-07 ENCOUNTER — Other Ambulatory Visit (HOSPITAL_COMMUNITY): Payer: Self-pay

## 2024-01-08 ENCOUNTER — Other Ambulatory Visit (HOSPITAL_COMMUNITY): Payer: Self-pay

## 2024-01-09 ENCOUNTER — Other Ambulatory Visit (HOSPITAL_COMMUNITY): Payer: Self-pay

## 2024-01-09 MED FILL — Glucose Blood Test Strip: 25 days supply | Qty: 100 | Fill #0 | Status: AC

## 2024-01-09 NOTE — Telephone Encounter (Unsigned)
 Copied from CRM [id number]. Topic: Clinical - Medication Question >> Jan 09, 2024  3:21 PM [name] wrote: Reason for CRM: Patient would like to know if she is supposed to take #1 or #2 for her ozempic medication.

## 2024-01-10 ENCOUNTER — Other Ambulatory Visit (HOSPITAL_COMMUNITY): Payer: Self-pay

## 2024-01-23 ENCOUNTER — Other Ambulatory Visit (HOSPITAL_COMMUNITY): Payer: Self-pay

## 2024-01-28 ENCOUNTER — Other Ambulatory Visit: Payer: Self-pay | Admitting: Family Medicine

## 2024-01-28 ENCOUNTER — Other Ambulatory Visit (HOSPITAL_COMMUNITY): Payer: Self-pay

## 2024-01-28 MED ORDER — ONETOUCH VERIO VI STRP
ORAL_STRIP | 3 refills | Status: DC
  Filled 2024-02-26: qty 100, 25d supply, fill #0
  Filled 2024-03-19 – 2024-05-20 (×2): qty 100, 25d supply, fill #1
  Filled 2024-06-09: qty 100, 25d supply, fill #2
  Filled 2024-07-03: qty 100, 25d supply, fill #3

## 2024-02-01 ENCOUNTER — Other Ambulatory Visit (HOSPITAL_COMMUNITY): Payer: Self-pay

## 2024-02-06 ENCOUNTER — Other Ambulatory Visit (HOSPITAL_COMMUNITY): Payer: Self-pay

## 2024-02-19 ENCOUNTER — Ambulatory Visit: Payer: PPO | Admitting: Family Medicine

## 2024-02-25 ENCOUNTER — Encounter: Payer: Self-pay | Admitting: Family Medicine

## 2024-02-25 ENCOUNTER — Ambulatory Visit (INDEPENDENT_AMBULATORY_CARE_PROVIDER_SITE_OTHER): Admitting: Family Medicine

## 2024-02-25 VITALS — BP 136/75 | HR 68 | Temp 97.2°F | Ht 60.0 in | Wt 126.0 lb

## 2024-02-25 DIAGNOSIS — E119 Type 2 diabetes mellitus without complications: Secondary | ICD-10-CM | POA: Diagnosis not present

## 2024-02-25 DIAGNOSIS — M2040 Other hammer toe(s) (acquired), unspecified foot: Secondary | ICD-10-CM

## 2024-02-25 DIAGNOSIS — E1159 Type 2 diabetes mellitus with other circulatory complications: Secondary | ICD-10-CM | POA: Diagnosis not present

## 2024-02-25 DIAGNOSIS — I152 Hypertension secondary to endocrine disorders: Secondary | ICD-10-CM | POA: Diagnosis not present

## 2024-02-25 LAB — POCT GLYCOSYLATED HEMOGLOBIN (HGB A1C): Hemoglobin A1C: 6.2 % — AB (ref 4.0–5.6)

## 2024-02-25 LAB — MICROALBUMIN / CREATININE URINE RATIO
Creatinine,U: 46.5 mg/dL
Microalb Creat Ratio: 142.2 mg/g — ABNORMAL HIGH (ref 0.0–30.0)
Microalb, Ur: 6.6 mg/dL — ABNORMAL HIGH (ref 0.0–1.9)

## 2024-02-25 NOTE — Progress Notes (Signed)
   [name] is a 89 y.o. female who presents today for an office visit.  Assessment/Plan:  New/Acute Problems: Cerumen impaction Successfully irrigated by RMA today.  She tolerated well.  Can use over-the-counter Debrox or hydrogen peroxide as needed to prevent buildup.  Chronic Problems Addressed Today: Diabetes mellitus without complication (HCC) A1c stable 6.2.  Her Ozempic  has recently significantly increased in price and she will be putting about $200 per month going forward.  She would like to discontinue this if possible.  Given her good glycemic control would be reasonable for her to wean off of this over the next several weeks.  She will continue to monitor her sugar at home and let us  know if persistently elevated.  We can recheck A1c in 3 months.  Hammer toe Will refer to podiatry for further evaluation and management.  Hypertension associated with diabetes (HCC) At goal.  Continue losartan 50 mg daily.     Subjective:  HPI:  See Assessment / plan for status of chronic conditions.  She is doing well today. She would like to wean off her Ozempic  due to recent cost increase.        Objective:  Physical Exam: BP 136/75   Pulse 68   Temp (!) 97.2 F (36.2 C) (Temporal)   Ht 5' (1.524 m)   Wt 126 lb (57.2 kg)   SpO2 100%   BMI 24.61 kg/m   Gen: No acute distress, resting comfortably HEENT: Right TM with cerumen.  Left TM clear. CV: Regular rate and rhythm with no murmurs appreciated Pulm: Normal work of breathing, clear to auscultation bilaterally with no crackles, wheezes, or rhonchi MUSCULOSKELETAL: Hammertoe deformity noted in the left foot. Neuro: Grossly normal, moves all extremities Psych: Normal affect and thought content      Arneshia Ade M. Daneil Dunker, MD 02/25/2024 10:57 AM

## 2024-02-25 NOTE — Patient Instructions (Addendum)
 It was very nice to see you today!  Your A1c today is 6.2. IT is ok for you to come off the Ozempic . We should recheck in 3 months.   I will refer you to see the podiatrist for you foot.   We flushed out your ears today.   Return in about 3 months (around 05/26/2024).   Take care, Dr Daneil Dunker  PLEASE NOTE:  If you had any lab tests, please let us  know if you have not heard back within a few days. You may see your results on mychart before we have a chance to review them but we will give you a call once they are reviewed by us .   If we ordered any referrals today, please let us  know if you have not heard from their office within the next week.   If you had any urgent prescriptions sent in today, please check with the pharmacy within an hour of our visit to make sure the prescription was transmitted appropriately.   Please try these tips to maintain a healthy lifestyle:  Eat at least 3 REAL meals and 1-2 snacks per day.  Aim for no more than 5 hours between eating.  If you eat breakfast, please do so within one hour of getting up.   Each meal should contain half fruits/vegetables, one quarter protein, and one quarter carbs (no bigger than a computer mouse)  Cut down on sweet beverages. This includes juice, soda, and sweet tea.   Drink at least 1 glass of water with each meal and aim for at least 8 glasses per day  Exercise at least 150 minutes every week.

## 2024-02-25 NOTE — Assessment & Plan Note (Signed)
 Will refer to podiatry for further evaluation and management.

## 2024-02-25 NOTE — Assessment & Plan Note (Signed)
 A1c stable 6.2.  Her Ozempic  has recently significantly increased in price and she will be putting about $200 per month going forward.  She would like to discontinue this if possible.  Given her good glycemic control would be reasonable for her to wean off of this over the next several weeks.  She will continue to monitor her sugar at home and let us  know if persistently elevated.  We can recheck A1c in 3 months.

## 2024-02-25 NOTE — Assessment & Plan Note (Signed)
At goal.  Continue losartan 50 mg daily. 

## 2024-02-26 ENCOUNTER — Other Ambulatory Visit (HOSPITAL_COMMUNITY): Payer: Self-pay

## 2024-02-26 MED FILL — Losartan Potassium Tab 50 MG: ORAL | 90 days supply | Qty: 90 | Fill #0 | Status: AC

## 2024-02-26 MED FILL — Omeprazole Cap Delayed Release 20 MG: ORAL | 90 days supply | Qty: 90 | Fill #0 | Status: AC

## 2024-02-28 ENCOUNTER — Other Ambulatory Visit: Payer: Self-pay

## 2024-02-28 ENCOUNTER — Encounter: Payer: Self-pay | Admitting: Family Medicine

## 2024-02-28 ENCOUNTER — Inpatient Hospital Stay: Payer: PPO

## 2024-02-28 ENCOUNTER — Inpatient Hospital Stay: Admitting: Hematology & Oncology

## 2024-02-28 ENCOUNTER — Inpatient Hospital Stay: Payer: PPO | Admitting: Hematology & Oncology

## 2024-02-28 ENCOUNTER — Encounter: Payer: Self-pay | Admitting: Hematology & Oncology

## 2024-02-28 ENCOUNTER — Inpatient Hospital Stay: Attending: Hematology & Oncology

## 2024-02-28 VITALS — BP 132/59 | HR 73 | Temp 98.0°F | Resp 18 | Ht 60.0 in | Wt 128.0 lb

## 2024-02-28 DIAGNOSIS — M2041 Other hammer toe(s) (acquired), right foot: Secondary | ICD-10-CM | POA: Diagnosis not present

## 2024-02-28 DIAGNOSIS — M869 Osteomyelitis, unspecified: Secondary | ICD-10-CM | POA: Diagnosis not present

## 2024-02-28 DIAGNOSIS — D0511 Intraductal carcinoma in situ of right breast: Secondary | ICD-10-CM

## 2024-02-28 DIAGNOSIS — Z853 Personal history of malignant neoplasm of breast: Secondary | ICD-10-CM | POA: Diagnosis not present

## 2024-02-28 DIAGNOSIS — C50012 Malignant neoplasm of nipple and areola, left female breast: Secondary | ICD-10-CM

## 2024-02-28 DIAGNOSIS — Z170 Estrogen receptor positive status [ER+]: Secondary | ICD-10-CM

## 2024-02-28 DIAGNOSIS — E119 Type 2 diabetes mellitus without complications: Secondary | ICD-10-CM | POA: Diagnosis not present

## 2024-02-28 DIAGNOSIS — Z79899 Other long term (current) drug therapy: Secondary | ICD-10-CM | POA: Insufficient documentation

## 2024-02-28 DIAGNOSIS — C50011 Malignant neoplasm of nipple and areola, right female breast: Secondary | ICD-10-CM | POA: Diagnosis not present

## 2024-02-28 LAB — CBC WITH DIFFERENTIAL (CANCER CENTER ONLY)
Abs Immature Granulocytes: 0.01 10*3/uL (ref 0.00–0.07)
Basophils Absolute: 0 10*3/uL (ref 0.0–0.1)
Basophils Relative: 0 %
Eosinophils Absolute: 0.1 10*3/uL (ref 0.0–0.5)
Eosinophils Relative: 1 %
HCT: 43.8 % (ref 36.0–46.0)
Hemoglobin: 15.3 g/dL — ABNORMAL HIGH (ref 12.0–15.0)
Immature Granulocytes: 0 %
Lymphocytes Relative: 42 %
Lymphs Abs: 3 10*3/uL (ref 0.7–4.0)
MCH: 30.5 pg (ref 26.0–34.0)
MCHC: 34.9 g/dL (ref 30.0–36.0)
MCV: 87.4 fL (ref 80.0–100.0)
Monocytes Absolute: 0.4 10*3/uL (ref 0.1–1.0)
Monocytes Relative: 6 %
Neutro Abs: 3.6 10*3/uL (ref 1.7–7.7)
Neutrophils Relative %: 51 %
Platelet Count: 266 10*3/uL (ref 150–400)
RBC: 5.01 MIL/uL (ref 3.87–5.11)
RDW: 13.1 % (ref 11.5–15.5)
WBC Count: 7.1 10*3/uL (ref 4.0–10.5)
nRBC: 0 % (ref 0.0–0.2)

## 2024-02-28 LAB — CMP (CANCER CENTER ONLY)
ALT: 17 U/L (ref 0–44)
AST: 18 U/L (ref 15–41)
Albumin: 4.4 g/dL (ref 3.5–5.0)
Alkaline Phosphatase: 74 U/L (ref 38–126)
Anion gap: 5 (ref 5–15)
BUN: 11 mg/dL (ref 8–23)
CO2: 33 mmol/L — ABNORMAL HIGH (ref 22–32)
Calcium: 10.5 mg/dL — ABNORMAL HIGH (ref 8.9–10.3)
Chloride: 103 mmol/L (ref 98–111)
Creatinine: 0.89 mg/dL (ref 0.44–1.00)
GFR, Estimated: >60 mL/min (ref >60)
Glucose, Bld: 153 mg/dL — ABNORMAL HIGH (ref 70–99)
Potassium: 4.5 mmol/L (ref 3.5–5.1)
Sodium: 141 mmol/L (ref 135–145)
Total Bilirubin: 0.8 mg/dL (ref 0.0–1.2)
Total Protein: 7.5 g/dL (ref 6.5–8.1)

## 2024-02-28 NOTE — Progress Notes (Signed)
 She has moderate protein in her urine.  This is a common finding.  We can increase her losartan  to 100 mg daily.  This will reduce the protein in her urine.  Please send a new prescription if needed.  Recommend we recheck again in 3 months.  She should monitor her blood pressure at home and let us  know if she has any issues with the increased dose of losartan .

## 2024-02-28 NOTE — Progress Notes (Signed)
 Hematology and Oncology Follow Up Visit  [name] [id number] Nov 30, 1934 89 y.o. 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  We last saw her back in January.  Since then, she has been doing okay.  Unfortunately, she has a site in the hospital right now.  He is going to have a foot amputated because of osteomyelitis.  Surprisingly, he does not have diabetes.  She has been doing well.  She has been trying to stay active.  She is trying to exercise..  She does have some hammertoes on the on the right foot.  She will see podiatry for this.  There has been no problems with fever.  She has had no problems with COVID or Influenza.  There is been no change in bowel or bladder habits.  She has had no rashes.  She has had no leg swelling..  Overall, I would have to say that her performance status is probably ECOG 2.  .  Medications:  Current Outpatient Medications:    Cholecalciferol (VITAMIN D -3) 1000 UNITS CAPS, Take 2,000 Units by mouth 2 (two) times daily., Disp: , Rfl:    cinacalcet  (SENSIPAR ) 30 MG tablet, Take 1 tablet (30 mg total) by mouth 2 (two) times daily with a meal., Disp: 180 tablet, Rfl: 1   glucose blood (ONETOUCH VERIO) test strip, USE TO TEST BLOOD SUGAR FOUR TIMES DAILY., Disp: 100 strip, Rfl: 3   losartan  (COZAAR ) 50 MG tablet, Take 1 tablet (50 mg total) by mouth daily., Disp: 90 tablet, Rfl: 1   Multiple Vitamins-Minerals (CENTRUM SILVER 50+WOMEN PO), Take 1 tablet by mouth daily., Disp: , Rfl:    omeprazole  (PRILOSEC) 20 MG capsule, TAKE 1 CAPSULE(20 MG) BY MOUTH DAILY, Disp: 90 capsule, Rfl: 3   rosuvastatin  (CRESTOR ) 10 MG tablet, Take 1 tablet (10 mg total) by mouth daily., Disp: 90 tablet, Rfl: 1   sodium chloride (MURO 128) 2 % ophthalmic solution, Place 1 drop into both  eyes 2 (two) times daily., Disp: , Rfl:    vitamin C (ASCORBIC ACID) 250 MG tablet, Take 250 mg by mouth daily., Disp: , Rfl:   Allergies: No Known Allergies  Past Medical History, Surgical history, Social history, and Family History were reviewed and updated.  Review of Systems: Review of Systems  Constitutional: Negative.   HENT:  Negative.    Eyes: Negative.   Respiratory: Negative.    Cardiovascular: Negative.   Gastrointestinal: Negative.   Endocrine: Negative.   Genitourinary: Negative.    Musculoskeletal: Negative.   Skin: Negative.   Neurological: Negative.   Hematological: Negative.   Psychiatric/Behavioral: Negative.      Physical Exam:  height is 5' (1.524 m) and weight is 128 lb (58.1 kg). Her oral temperature is 98 F (36.7 C). Her blood pressure is 132/59 (abnormal) and her pulse is 73. Her respiration is 18 and oxygen saturation is 100%.   Wt Readings from Last 3 Encounters:  02/28/24 128 lb (58.1 kg)  02/25/24 126 lb (57.2 kg)  12/03/23 131 lb 12.8 oz (59.8 kg)    Physical Exam Vitals reviewed.  Constitutional:      Comments: Her right breast shows the lumpectomy scar at the edge of the areola.  This is about the 11 o'clock position.  It is well-healed.  There is  no mass in the right breast.  There is no nipple discharge on the right side.  There is no right axillary adenopathy.  Left breast shows a well-healed lumpectomy at about the 7 o'clock position.  There is no mass in the left breast.  There is no left axillary adenopathy.  HENT:     Head: Normocephalic and atraumatic.  Eyes:     Pupils: Pupils are equal, round, and reactive to light.  Cardiovascular:     Rate and Rhythm: Normal rate and regular rhythm.     Heart sounds: Normal heart sounds.  Pulmonary:     Effort: Pulmonary effort is normal.     Breath sounds: Normal breath sounds.  Abdominal:     General: Bowel sounds are normal.     Palpations: Abdomen is soft.  Musculoskeletal:         General: No tenderness or deformity. Normal range of motion.     Cervical back: Normal range of motion.  Lymphadenopathy:     Cervical: No cervical adenopathy.  Skin:    General: Skin is warm and dry.     Findings: No erythema or rash.  Neurological:     Mental Status: She is alert and oriented to person, place, and time.  Psychiatric:        Behavior: Behavior normal.        Thought Content: Thought content normal.        Judgment: Judgment normal.      Lab Results  Component Value Date   WBC 7.1 02/28/2024   HGB 15.3 (H) 02/28/2024   HCT 43.8 02/28/2024   MCV 87.4 02/28/2024   PLT 266 02/28/2024     Chemistry      Component Value Date/Time   NA 141 02/28/2024 1227   NA 143 05/28/2023 0947   NA 145 02/15/2017 1333   NA 144 08/12/2015 1419   K 4.5 02/28/2024 1227   K 4.4 02/15/2017 1333   K 4.2 08/12/2015 1419   CL 103 02/28/2024 1227   CL 104 02/15/2017 1333   CO2 33 (H) 02/28/2024 1227   CO2 28 02/15/2017 1333   CO2 29 08/12/2015 1419   BUN 11 02/28/2024 1227   BUN 6 (L) 05/28/2023 0947   BUN 13 02/15/2017 1333   BUN 11.8 08/12/2015 1419   CREATININE 0.89 02/28/2024 1227   CREATININE 0.82 03/24/2020 1122   CREATININE 0.8 08/12/2015 1419   GLU 197 09/18/2018 0000      Component Value Date/Time   CALCIUM  10.5 (H) 02/28/2024 1227   CALCIUM  10.2 02/15/2017 1333   CALCIUM  11.1 (H) 08/12/2015 1419   ALKPHOS 74 02/28/2024 1227   ALKPHOS 76 02/15/2017 1333   ALKPHOS 74 08/12/2015 1419   AST 18 02/28/2024 1227   AST 26 08/12/2015 1419   ALT 17 02/28/2024 1227   ALT 39 02/15/2017 1333   ALT 32 08/12/2015 1419   BILITOT 0.8 02/28/2024 1227   BILITOT 0.52 08/12/2015 1419       Impression and Plan: [name] is a very charming 89 year old postmenopausal white female.  She was diagnosed with DCIS of the right breast.  This was in August 2024.  She underwent a lumpectomy.  I do not feel that there is any indication for adjuvant therapy.  We will plan to get  her back in 6 months.  I know that she will be busy with her son.  I am impressed that she is going to try to help take care  of him.  I know that this will certainly be a challenge for her.   Ivor Mars, MD 5/1/20251:32 PM

## 2024-02-29 ENCOUNTER — Other Ambulatory Visit: Payer: Self-pay | Admitting: Family Medicine

## 2024-02-29 ENCOUNTER — Other Ambulatory Visit (HOSPITAL_COMMUNITY): Payer: Self-pay

## 2024-02-29 DIAGNOSIS — E785 Hyperlipidemia, unspecified: Secondary | ICD-10-CM

## 2024-02-29 DIAGNOSIS — C50919 Malignant neoplasm of unspecified site of unspecified female breast: Secondary | ICD-10-CM

## 2024-02-29 MED ORDER — ROSUVASTATIN CALCIUM 10 MG PO TABS
10.0000 mg | ORAL_TABLET | Freq: Every day | ORAL | 1 refills | Status: DC
  Filled 2024-02-29: qty 90, 90d supply, fill #0
  Filled 2024-05-19: qty 90, 90d supply, fill #1

## 2024-03-03 ENCOUNTER — Other Ambulatory Visit: Payer: Self-pay

## 2024-03-03 ENCOUNTER — Other Ambulatory Visit (HOSPITAL_COMMUNITY): Payer: Self-pay

## 2024-03-03 ENCOUNTER — Ambulatory Visit (INDEPENDENT_AMBULATORY_CARE_PROVIDER_SITE_OTHER): Admitting: Podiatry

## 2024-03-03 ENCOUNTER — Ambulatory Visit (INDEPENDENT_AMBULATORY_CARE_PROVIDER_SITE_OTHER)

## 2024-03-03 ENCOUNTER — Other Ambulatory Visit: Payer: Self-pay | Admitting: *Deleted

## 2024-03-03 ENCOUNTER — Encounter: Payer: Self-pay | Admitting: Podiatry

## 2024-03-03 VITALS — Ht 60.0 in | Wt 128.0 lb

## 2024-03-03 DIAGNOSIS — M2041 Other hammer toe(s) (acquired), right foot: Secondary | ICD-10-CM | POA: Diagnosis not present

## 2024-03-03 DIAGNOSIS — M2042 Other hammer toe(s) (acquired), left foot: Secondary | ICD-10-CM

## 2024-03-03 DIAGNOSIS — D2372 Other benign neoplasm of skin of left lower limb, including hip: Secondary | ICD-10-CM

## 2024-03-03 MED ORDER — LOSARTAN POTASSIUM 100 MG PO TABS
100.0000 mg | ORAL_TABLET | Freq: Every day | ORAL | 1 refills | Status: DC
  Filled 2024-03-03: qty 90, 90d supply, fill #0
  Filled 2024-05-26: qty 90, 90d supply, fill #1

## 2024-03-03 NOTE — Progress Notes (Signed)
   Chief Complaint  Patient presents with   Hammer Toe    New pt- referred for hammertoes both feet    HPI: 89 y.o. female presenting today for evaluation of pain and tenderness associated to the toes to bilateral feet.  Patient has a long history of hammertoes.  She says that recently she developed pain associated with her toes  Past Medical History:  Diagnosis Date   Arthritis    Cancer (HCC)    Diabetes mellitus without complication (HCC)    GERD (gastroesophageal reflux disease)    Hyperlipidemia    Hypertension    Kidney stones    Thyroid  disease       Objective: Physical Exam General: The patient is alert and oriented x3 in no acute distress.  Dermatology: Skin is cool, dry and supple bilateral lower extremities. Negative for open lesions or macerations.  Hyperkeratotic skin lesion noted overlying the PIPJ of the fourth digit left foot as well as the fifth digit right foot with a central nucleated core  Vascular: Palpable pedal pulses bilaterally. No edema or erythema noted. Capillary refill within normal limits.  Neurological: Grossly intact via light touch Musculoskeletal Exam: All pedal and ankle joints range of motion within normal limits bilateral. Muscle strength 5/5 in all groups bilateral. Hammertoe contracture deformity noted bilateral  Radiographic Exam B/L feet 03/03/2024: Hammertoe contracture deformity noted to the interphalangeal joints and MPJ of the respective hammertoe digits mentioned on clinical musculoskeletal exam.     Assessment: 1.  Hammertoe lesser digits bilateral 2.  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 X-Rays reviewed.  -Excisional debridement of the hyperkeratotic lesions was performed today using a 312 scalpel without incident or bleeding.  Patient felt significant relief.  Salicylic acid and Band-Aid applied -Recommend OTC salicylic acid as needed.  Patient states that she is  used this before in the past -Continue wearing good supportive tennis shoes that do not constrict the toebox area -Return to clinic as needed  Dot Gazella, DPM [facility]  Dr. Dot Gazella, DPM    [address] [place], [place] [address]                Office [phone]  Fax [phone]

## 2024-03-04 ENCOUNTER — Telehealth: Payer: Self-pay

## 2024-03-04 NOTE — Telephone Encounter (Signed)
 Tried to return a call to pt, left a message requesting pt return call to the office.

## 2024-03-05 ENCOUNTER — Other Ambulatory Visit: Payer: Self-pay | Admitting: "Endocrinology

## 2024-03-06 NOTE — Telephone Encounter (Signed)
 Left a message requesting pt return call to the office.

## 2024-03-07 NOTE — Telephone Encounter (Signed)
 Left a message requesting pt return call to the office.

## 2024-03-17 DIAGNOSIS — L570 Actinic keratosis: Secondary | ICD-10-CM | POA: Diagnosis not present

## 2024-03-17 DIAGNOSIS — L821 Other seborrheic keratosis: Secondary | ICD-10-CM | POA: Diagnosis not present

## 2024-03-17 DIAGNOSIS — Z85820 Personal history of malignant melanoma of skin: Secondary | ICD-10-CM | POA: Diagnosis not present

## 2024-03-17 DIAGNOSIS — L723 Sebaceous cyst: Secondary | ICD-10-CM | POA: Diagnosis not present

## 2024-03-18 ENCOUNTER — Telehealth: Payer: Self-pay

## 2024-03-18 NOTE — Telephone Encounter (Signed)
 Tried to return call to pt, left a message requesting pt return call to the office.

## 2024-03-19 ENCOUNTER — Other Ambulatory Visit (HOSPITAL_COMMUNITY): Payer: Self-pay

## 2024-04-17 ENCOUNTER — Other Ambulatory Visit (HOSPITAL_COMMUNITY): Payer: Self-pay

## 2024-04-19 ENCOUNTER — Other Ambulatory Visit (HOSPITAL_COMMUNITY): Payer: Self-pay

## 2024-04-19 ENCOUNTER — Other Ambulatory Visit: Payer: Self-pay | Admitting: "Endocrinology

## 2024-04-21 ENCOUNTER — Other Ambulatory Visit (HOSPITAL_COMMUNITY): Payer: Self-pay

## 2024-04-21 MED ORDER — CINACALCET HCL 30 MG PO TABS
30.0000 mg | ORAL_TABLET | Freq: Two times a day (BID) | ORAL | 1 refills | Status: DC
  Filled 2024-04-21: qty 180, 90d supply, fill #0
  Filled 2024-07-15: qty 180, 90d supply, fill #1

## 2024-04-22 ENCOUNTER — Other Ambulatory Visit (HOSPITAL_COMMUNITY): Payer: Self-pay

## 2024-04-22 ENCOUNTER — Other Ambulatory Visit: Payer: Self-pay

## 2024-04-23 ENCOUNTER — Other Ambulatory Visit (HOSPITAL_COMMUNITY): Payer: Self-pay

## 2024-04-23 ENCOUNTER — Other Ambulatory Visit: Payer: Self-pay

## 2024-04-24 ENCOUNTER — Other Ambulatory Visit (HOSPITAL_COMMUNITY): Payer: Self-pay

## 2024-05-06 NOTE — Telephone Encounter (Signed)
 Copied from CRM [id number]. Topic: Clinical - Medical Advice >> May 06, 2024 12:04 PM Mesmerise C wrote: Reason for CRM: Patient stopped taking her Ozempic  her sugar levels have been above 200 and would like to speak to Dr. Shaune nurse to discuss what needs to happen moving forward patient can be reached at [phone]   Please advise  [facility]

## 2024-05-07 ENCOUNTER — Ambulatory Visit: Payer: Self-pay

## 2024-05-07 NOTE — Telephone Encounter (Signed)
 Appt tomorrow.

## 2024-05-07 NOTE — Telephone Encounter (Signed)
 We can discuss at her upcoming appointment however if she wishes to go back on Ozempic  that is fine. Please send in 0.25 mg weekly dose if needed.

## 2024-05-07 NOTE — Telephone Encounter (Signed)
 FYI Only or Action Required?: Action required by provider: request for appointment.  Patient was last seen in primary care on 02/25/2024 by Kennyth Worth HERO, MD.  Called Nurse Triage reporting Dysuria.  Symptoms began today.  Interventions attempted: Nothing.  Symptoms are: gradually worsening.  Triage Disposition: See Physician Within 24 Hours  Patient/caregiver understands and will follow disposition?: Yes1. SYMPTOM: What's the main symptom you're concerned about? (e.g., frequency, incontinence) uncomfortable burning 2. ONSET: When did the  start? today 3. PAIN: Is there any pain? If Yes, ask: How bad is it? (Scale: 1-10; mild, moderate, severe) burning pain; mild  4. CAUSE: What do you think is causing the symptoms? UTI 5. OTHER SYMPTOMS: Do you have any other symptoms? (e.g., blood in urine, fever, flank pain, pain with urination) No other symptoms              Copied from CRM #[id number]. Topic: Clinical - Red Word Triage >> May 07, 2024  1:53 PM Martinique E wrote: [facility] that prompted transfer to Nurse Triage: Possible bladder infection. Patient stated she is having a lot of discomfort and pain when going to the bathroom, symptoms started this morning. Reason for Disposition  Urinating more frequently than usual (i.e., frequency) OR new-onset of the feeling of an urgent need to urinate (i.e., urgency)  Protocols used: Urinary Symptoms-A-AH

## 2024-05-08 ENCOUNTER — Telehealth (HOSPITAL_COMMUNITY): Payer: Self-pay

## 2024-05-08 ENCOUNTER — Ambulatory Visit: Admitting: Family Medicine

## 2024-05-08 ENCOUNTER — Other Ambulatory Visit (HOSPITAL_COMMUNITY): Payer: Self-pay

## 2024-05-08 ENCOUNTER — Other Ambulatory Visit: Payer: Self-pay

## 2024-05-08 VITALS — BP 122/74 | HR 68 | Temp 97.7°F | Ht 60.0 in | Wt 133.2 lb

## 2024-05-08 DIAGNOSIS — R300 Dysuria: Secondary | ICD-10-CM

## 2024-05-08 DIAGNOSIS — E1159 Type 2 diabetes mellitus with other circulatory complications: Secondary | ICD-10-CM | POA: Diagnosis not present

## 2024-05-08 DIAGNOSIS — Z7985 Long-term (current) use of injectable non-insulin antidiabetic drugs: Secondary | ICD-10-CM | POA: Diagnosis not present

## 2024-05-08 DIAGNOSIS — I152 Hypertension secondary to endocrine disorders: Secondary | ICD-10-CM | POA: Diagnosis not present

## 2024-05-08 DIAGNOSIS — E119 Type 2 diabetes mellitus without complications: Secondary | ICD-10-CM

## 2024-05-08 LAB — POCT URINALYSIS DIPSTICK
Bilirubin, UA: NEGATIVE
Blood, UA: POSITIVE
Glucose, UA: POSITIVE — AB
Ketones, UA: NEGATIVE
Nitrite, UA: NEGATIVE
Protein, UA: NEGATIVE
Spec Grav, UA: 1.02 (ref 1.010–1.025)
Urobilinogen, UA: 0.2 U/dL
pH, UA: 6.5 (ref 5.0–8.0)

## 2024-05-08 MED ORDER — TIRZEPATIDE 2.5 MG/0.5ML ~~LOC~~ SOAJ
2.5000 mg | SUBCUTANEOUS | 0 refills | Status: DC
  Filled 2024-05-08: qty 2, 28d supply, fill #0

## 2024-05-08 MED ORDER — NITROFURANTOIN MONOHYD MACRO 100 MG PO CAPS
100.0000 mg | ORAL_CAPSULE | Freq: Two times a day (BID) | ORAL | 0 refills | Status: DC
  Filled 2024-05-08: qty 14, 7d supply, fill #0

## 2024-05-08 NOTE — Assessment & Plan Note (Signed)
 Last A1c was stable at 6.2 however she has had significant elevated fasting glucoses over the last few weeks into the 200s.  She was previously on Ozempic  however we discontinued this several months ago due to her good glycemic control.  Given her recent elevation in fasting glucoses would be reasonable to restart today even though she is not quite yet due for her A1c.  We will start Mounjaro  2.5 mg weekly.  She is aware of potential side effects.  She will follow-up with us  in a few weeks and we can recheck A1c at that time.

## 2024-05-08 NOTE — Progress Notes (Signed)
   [name] is a 89 y.o. female who presents today for an office visit.  Assessment/Plan:  New/Acute Problems: Dysuria No red flags or signs of systemic illness.  Her point-of-care urinalysis notable for positive hemoglobin however does have known history of nephrolithiasis.  Positive leuk esterase.  Nitrite negative.  Symptoms are consistent with prior UTIs.  Will empirically start Macrobid  while we await culture results.  Also encouraged hydration.  We discussed reasons to return to care and seek emergent care.  Chronic Problems Addressed Today: Diabetes mellitus without complication (HCC) Last A1c was stable at 6.2 however she has had significant elevated fasting glucoses over the last few weeks into the 200s.  She was previously on Ozempic  however we discontinued this several months ago due to her good glycemic control.  Given her recent elevation in fasting glucoses would be reasonable to restart today even though she is not quite yet due for her A1c.  We will start Mounjaro  2.5 mg weekly.  She is aware of potential side effects.  She will follow-up with us  in a few weeks and we can recheck A1c at that time.  Hypertension associated with diabetes (HCC) We we will increase dose of losartan  to 100 mg daily for her elevated UACR.  Tolerating well.  Blood pressure is at goal today     Subjective:  HPI:  See A/P for status of chronic conditions.  Patient is here today with concern for UTI.  Yesterday started developing dysuria. She took a dose pyridium  last night helped. No fevers or chills.  Symptoms are consistent with prior UTIs.       Objective:  Physical Exam: BP 122/74 (BP Location: Right Arm, Patient Position: Sitting, Cuff Size: Normal)   Pulse 68   Temp 97.7 F (36.5 C) (Temporal)   Ht 5' (1.524 m)   Wt 133 lb 3.2 oz (60.4 kg)   SpO2 90%   BMI 26.01 kg/m   Wt Readings from Last 3 Encounters:  05/08/24 133 lb 3.2 oz (60.4 kg)  03/03/24 128 lb (58.1 kg)  02/28/24  128 lb (58.1 kg)    Gen: No acute distress, resting comfortably HEENT: EAC clear bilaterally. CV: Regular rate and rhythm with no murmurs appreciated Pulm: Normal work of breathing, clear to auscultation bilaterally with no crackles, wheezes, or rhonchi Neuro: Grossly normal, moves all extremities Psych: Normal affect and thought content      Yale Golla M. Kennyth, MD 05/08/2024 10:32 AM

## 2024-05-08 NOTE — Telephone Encounter (Signed)
 Pharmacy Patient Advocate Encounter  Received notification from [facility] [employer] that Prior Authorization for Mounjaro  2.5MG /0.5ML auto-injectors  has been APPROVED from 05/08/24 to 05/08/25. Ran test claim, Copay is $0. This test claim was processed through [facility]- copay amounts may vary at other pharmacies due to pharmacy/plan contracts, or as the patient moves through the different stages of their insurance plan.   PA #/Case ID/Reference #: [id number]

## 2024-05-08 NOTE — Assessment & Plan Note (Signed)
 We we will increase dose of losartan  to 100 mg daily for her elevated UACR.  Tolerating well.  Blood pressure is at goal today

## 2024-05-08 NOTE — Telephone Encounter (Signed)
 PA request has been Received. New Encounter has been or will be created for follow up. For additional info see Pharmacy Prior Auth telephone encounter from 05/08/24.

## 2024-05-08 NOTE — Patient Instructions (Signed)
 It was very nice to see you today!  I am concerned that you may have a UTI.  Start the Macrobid .  We will contact you once the urine culture results come back.  We do need to work on lowering your sugar.  We will try Mounjaro  to see if your insurance approves this.  Let me know how this is working for you in a few weeks.   Return if symptoms worsen or fail to improve.   Take care, Dr Kennyth  PLEASE NOTE:  If you had any lab tests, please let us  know if you have not heard back within a few days. You may see your results on mychart before we have a chance to review them but we will give you a call once they are reviewed by us .   If we ordered any referrals today, please let us  know if you have not heard from their office within the next week.   If you had any urgent prescriptions sent in today, please check with the pharmacy within an hour of our visit to make sure the prescription was transmitted appropriately.   Please try these tips to maintain a healthy lifestyle:  Eat at least 3 REAL meals and 1-2 snacks per day.  Aim for no more than 5 hours between eating.  If you eat breakfast, please do so within one hour of getting up.   Each meal should contain half fruits/vegetables, one quarter protein, and one quarter carbs (no bigger than a computer mouse)  Cut down on sweet beverages. This includes juice, soda, and sweet tea.   Drink at least 1 glass of water with each meal and aim for at least 8 glasses per day  Exercise at least 150 minutes every week.

## 2024-05-09 LAB — URINE CULTURE
MICRO NUMBER:: 16682452
SPECIMEN QUALITY:: ADEQUATE

## 2024-05-12 ENCOUNTER — Ambulatory Visit: Payer: Self-pay | Admitting: Family Medicine

## 2024-05-12 ENCOUNTER — Other Ambulatory Visit (HOSPITAL_COMMUNITY): Payer: Self-pay

## 2024-05-12 NOTE — Progress Notes (Signed)
 Urine culture negative for UTI.  She should let us  know if symptoms are not improving.

## 2024-05-13 DIAGNOSIS — R921 Mammographic calcification found on diagnostic imaging of breast: Secondary | ICD-10-CM | POA: Diagnosis not present

## 2024-05-13 DIAGNOSIS — R92323 Mammographic fibroglandular density, bilateral breasts: Secondary | ICD-10-CM | POA: Diagnosis not present

## 2024-05-13 LAB — HM MAMMOGRAPHY

## 2024-05-14 NOTE — Telephone Encounter (Signed)
 Copied from CRM [id number]. Topic: Clinical - Lab/Test Results >> May 13, 2024  4:56 PM Viola F wrote: Reason for CRM: Patient returned [facility] phone call regarding results, I let her know that Urine culture negative for UTI and she should let us  know if symptoms are not improving - she wants to know if it has to do with her kidneys  See results note  Danajah Birdsell,RMA

## 2024-05-19 ENCOUNTER — Other Ambulatory Visit (HOSPITAL_COMMUNITY): Payer: Self-pay

## 2024-05-19 DIAGNOSIS — N200 Calculus of kidney: Secondary | ICD-10-CM | POA: Diagnosis not present

## 2024-05-19 DIAGNOSIS — R310 Gross hematuria: Secondary | ICD-10-CM | POA: Diagnosis not present

## 2024-05-19 DIAGNOSIS — R300 Dysuria: Secondary | ICD-10-CM | POA: Diagnosis not present

## 2024-05-19 MED FILL — Omeprazole Cap Delayed Release 20 MG: ORAL | 90 days supply | Qty: 90 | Fill #1 | Status: AC

## 2024-05-20 ENCOUNTER — Other Ambulatory Visit (HOSPITAL_COMMUNITY): Payer: Self-pay

## 2024-05-21 ENCOUNTER — Other Ambulatory Visit: Payer: Self-pay

## 2024-05-26 ENCOUNTER — Encounter: Payer: Self-pay | Admitting: Family Medicine

## 2024-05-26 ENCOUNTER — Other Ambulatory Visit (HOSPITAL_COMMUNITY): Payer: Self-pay

## 2024-05-26 ENCOUNTER — Ambulatory Visit (INDEPENDENT_AMBULATORY_CARE_PROVIDER_SITE_OTHER): Admitting: Family Medicine

## 2024-05-26 ENCOUNTER — Other Ambulatory Visit: Payer: Self-pay

## 2024-05-26 VITALS — BP 138/70 | HR 62 | Temp 97.3°F | Ht 60.0 in | Wt 135.0 lb

## 2024-05-26 DIAGNOSIS — E1159 Type 2 diabetes mellitus with other circulatory complications: Secondary | ICD-10-CM | POA: Diagnosis not present

## 2024-05-26 DIAGNOSIS — Z7985 Long-term (current) use of injectable non-insulin antidiabetic drugs: Secondary | ICD-10-CM | POA: Diagnosis not present

## 2024-05-26 DIAGNOSIS — I152 Hypertension secondary to endocrine disorders: Secondary | ICD-10-CM | POA: Diagnosis not present

## 2024-05-26 DIAGNOSIS — E119 Type 2 diabetes mellitus without complications: Secondary | ICD-10-CM

## 2024-05-26 LAB — POCT GLYCOSYLATED HEMOGLOBIN (HGB A1C): Hemoglobin A1C: 6.8 % — AB (ref 4.0–5.6)

## 2024-05-26 MED ORDER — TIRZEPATIDE 5 MG/0.5ML ~~LOC~~ SOAJ
5.0000 mg | SUBCUTANEOUS | 0 refills | Status: DC
  Filled 2024-05-26: qty 2, 28d supply, fill #0

## 2024-05-26 NOTE — Assessment & Plan Note (Signed)
 A1c 6.8.  She is doing well with Mounjaro  thus far and is interested in increasing the dose.  Will go to 5 mg weekly.  She is aware potential side effects.  Recheck A1c in 3 months.  She will follow-up with us  in a few weeks via MyChart we can adjust the dose of medications as tolerated.  She would like to get her weight back down into the 120s if possible.

## 2024-05-26 NOTE — Assessment & Plan Note (Signed)
 Blood pressure at goal today on losartan  100 mg daily.  Tolerating well.

## 2024-05-26 NOTE — Progress Notes (Signed)
   [name] is a 89 y.o. female who presents today for an office visit.  Assessment/Plan:  Chronic Problems Addressed Today: Diabetes mellitus without complication (HCC) A1c 6.8.  She is doing well with Mounjaro  thus far and is interested in increasing the dose.  Will go to 5 mg weekly.  She is aware potential side effects.  Recheck A1c in 3 months.  She will follow-up with us  in a few weeks via MyChart we can adjust the dose of medications as tolerated.  She would like to get her weight back down into the 120s if possible.  Hypertension associated with diabetes (HCC) Blood pressure at goal today on losartan  100 mg daily.  Tolerating well.     Subjective:  HPI:  See A/P for status of chronic conditions.  Patient is here today for follow-up.  I did see her a few weeks ago.  At that time we had restarted GLP-1 agonist due to fasting sugars at home.  We started her on 2.5 mg weekly. She has not noticed that this has made much of a difference. Her sugars are in the 150's-170s.  She has noticed increasing appetite last several weeks.       Objective:  Physical Exam: BP 138/70   Pulse 62   Temp (!) 97.3 F (36.3 C) (Temporal)   Ht 5' (1.524 m)   Wt 135 lb (61.2 kg)   SpO2 100%   BMI 26.37 kg/m   Wt Readings from Last 3 Encounters:  05/26/24 135 lb (61.2 kg)  05/08/24 133 lb 3.2 oz (60.4 kg)  03/03/24 128 lb (58.1 kg)    Gen: No acute distress, resting comfortably CV: Regular rate and rhythm with no murmurs appreciated Pulm: Normal work of breathing, clear to auscultation bilaterally with no crackles, wheezes, or rhonchi Neuro: Grossly normal, moves all extremities Psych: Normal affect and thought content      Mayan Dolney M. Kennyth, MD 05/26/2024 10:56 AM

## 2024-05-26 NOTE — Patient Instructions (Addendum)
 It was very nice to see you today!  Please increase your Mounjaro  to 5 mg weekly.  Let us  know how this is working for you in a few weeks.  Return in about 3 months (around 08/26/2024).   Take care, Dr Kennyth  PLEASE NOTE:  If you had any lab tests, please let us  know if you have not heard back within a few days. You may see your results on mychart before we have a chance to review them but we will give you a call once they are reviewed by us .   If we ordered any referrals today, please let us  know if you have not heard from their office within the next week.   If you had any urgent prescriptions sent in today, please check with the pharmacy within an hour of our visit to make sure the prescription was transmitted appropriately.   Please try these tips to maintain a healthy lifestyle:  Eat at least 3 REAL meals and 1-2 snacks per day.  Aim for no more than 5 hours between eating.  If you eat breakfast, please do so within one hour of getting up.   Each meal should contain half fruits/vegetables, one quarter protein, and one quarter carbs (no bigger than a computer mouse)  Cut down on sweet beverages. This includes juice, soda, and sweet tea.   Drink at least 1 glass of water with each meal and aim for at least 8 glasses per day  Exercise at least 150 minutes every week.

## 2024-05-27 DIAGNOSIS — E212 Other hyperparathyroidism: Secondary | ICD-10-CM | POA: Diagnosis not present

## 2024-05-28 NOTE — Telephone Encounter (Signed)
 Copied from CRM [id number]. Topic: Clinical - Medical Advice >> May 28, 2024  3:56 PM Sophia H wrote: Reason for CRM: Per last CRM - Pt is losing her voice and is struggling to speak, she thinks it could be related to one of her bp medications as a side effect, please give her a call ASAP to discuss.  Following up with clinic, declined to speak with nurse triage.   Left message to return call to our office at their convenience.  Tori Cupps,RMA

## 2024-05-28 NOTE — Telephone Encounter (Signed)
 Copied from CRM [id number]. Topic: Clinical - Medical Advice >> May 28, 2024 11:22 AM Jasmin G wrote: Reason for CRM: Pt is losing her voice and is struggling to speak, she thinks it could be related to one of her bp medications as a side effect, please give her a call ASAP to discuss    Left message to return call to our office at their convenience.  Oluwanifemi Susman,RMA

## 2024-05-29 LAB — PTH, INTACT AND CALCIUM
Calcium: 9.6 mg/dL (ref 8.7–10.3)
PTH: 48 pg/mL (ref 15–65)

## 2024-05-29 LAB — MAGNESIUM: Magnesium: 1.9 mg/dL (ref 1.6–2.3)

## 2024-06-02 ENCOUNTER — Ambulatory Visit (INDEPENDENT_AMBULATORY_CARE_PROVIDER_SITE_OTHER): Payer: PPO | Admitting: "Endocrinology

## 2024-06-02 ENCOUNTER — Encounter: Payer: Self-pay | Admitting: "Endocrinology

## 2024-06-02 VITALS — BP 126/58 | HR 68 | Ht 60.0 in | Wt 135.4 lb

## 2024-06-02 DIAGNOSIS — M8589 Other specified disorders of bone density and structure, multiple sites: Secondary | ICD-10-CM

## 2024-06-02 DIAGNOSIS — E212 Other hyperparathyroidism: Secondary | ICD-10-CM

## 2024-06-02 DIAGNOSIS — E213 Hyperparathyroidism, unspecified: Secondary | ICD-10-CM | POA: Diagnosis not present

## 2024-06-02 NOTE — Progress Notes (Signed)
 06/02/2024, 4:50 PM       Endocrinology follow-up note   [name] is a 89 y.o.-year-old female, here for follow-up of hypercalcemia related to hyperparathyroidism. PMD: her  Kennyth Worth HERO, MD    Past Medical History:  Diagnosis Date   Arthritis    Cancer [facility])    Diabetes mellitus without complication (HCC)    GERD (gastroesophageal reflux disease)    Hyperlipidemia    Hypertension    Kidney stones    Thyroid  disease     Past Surgical History:  Procedure Laterality Date   BACK SURGERY     BREAST BIOPSY  06/05/2023   MM RT RADIOACTIVE SEED LOC MAMMO GUIDE 06/05/2023 [facility] MAMMOGRAPHY   BREAST LUMPECTOMY WITH RADIOACTIVE SEED LOCALIZATION Right 06/06/2023   Procedure: RIGHT BREAST LUMPECTOMY WITH RADIOACTIVE SEED LOCALIZATION;  Surgeon: Belinda Cough, MD;  Location:  SURGERY C[facility];  Service: General;  Laterality: Right;  LMA   BREAST SURGERY     MELANOMA EXCISION     TONSILLECTOMY      Social History   Tobacco Use   Smoking status: Former    Current packs/day: 0.00    Average packs/day: 1 pack/day for 15.0 years (15.0 ttl pk-yrs)    Types: Cigarettes    Start date: 09/14/1955    Quit date: 07/14/1969    Years since quitting: 54.9   Smokeless tobacco: Never   Tobacco comments:    quit 40 years ago  Vaping Use   Vaping status: Never Used  Substance Use Topics   Alcohol  use: Never    Alcohol /week: 0.0 standard drinks of alcohol    Drug use: Never    Family History  Problem Relation Age of Onset   Cancer Mother        unk type; dx > 70; XRT in neck area   Hypertension Father    Other Other        brain tumor    Outpatient Encounter Medications as of 06/02/2024  Medication Sig   losartan  (COZAAR ) 100 MG tablet Take 1 tablet (100 mg total) by mouth daily. (Patient taking differently: Take 100 mg by mouth 2 (two) times daily.)   Cholecalciferol (VITAMIN D -3) 1000 UNITS CAPS Take  2,000 Units by mouth 2 (two) times daily.   cinacalcet  (SENSIPAR ) 30 MG tablet Take 1 tablet (30 mg total) by mouth 2 (two) times daily with a meal.   glucose blood (ONETOUCH VERIO) test strip USE TO TEST BLOOD SUGAR FOUR TIMES DAILY.   Multiple Vitamins-Minerals (CENTRUM SILVER 50+WOMEN PO) Take 1 tablet by mouth daily.   nitrofurantoin , macrocrystal-monohydrate, (MACROBID ) 100 MG capsule Take 1 capsule (100 mg total) by mouth 2 (two) times daily.   omeprazole  (PRILOSEC) 20 MG capsule TAKE 1 CAPSULE(20 MG) BY MOUTH DAILY   rosuvastatin  (CRESTOR ) 10 MG tablet Take 1 tablet (10 mg total) by mouth daily.   sodium chloride (MURO 128) 2 % ophthalmic solution Place 1 drop into both eyes 2 (two) times daily.   tirzepatide  (MOUNJARO ) 5 MG/0.5ML Pen Inject 5 mg into the skin once a week.   vitamin C (ASCORBIC ACID) 250 MG  tablet Take 250 mg by mouth daily.   [DISCONTINUED] cinacalcet  (SENSIPAR ) 30 MG tablet TAKE 1 TABLET(30 MG) BY MOUTH DAILY WITH BREAKFAST   No facility-administered encounter medications on file as of 06/02/2024.    No Known Allergies   HPI  [name] was diagnosed with hypercalcemia in May 2020.   Her previous workup indicated etiology of hypercalcemia to be primary hyperparathyroidism.  -Since she is not a surgical candidate, she was started on Sensipar ,  currently on 30 mg p.o. twice a day.  She continues to tolerate this medication.  She presents with controlled calcium  at 9.6 and PTH 48.    -Her last bone density in June 2023 showed osteopenia-with FRAX showing her risk of hip fracture is 4.8%.  She was supposed to have repeat bone density before next visit, however presents without doing her bone density yet.  She does not report any interval falls or fragility fractures.    She has diabetes controlled with recent A1c of 6.4% following up with her PMD.  She remains on Ozempic  2 mg subcutaneously weekly. She also has hyperlipidemia uncontrolled on Crestor  10 mg.  She  is known to have recurrent nephrolithiasis.  No history of CKD.   she is not on HCTZ or other thiazide therapy.  She is on vitamin D  supplement-4000 units daily.  She is not on calcium  supplements. she eats dairy and green, leafy, vegetables on average amounts.  she does not have a family history of hypercalcemia, pituitary tumors, thyroid  cancer, or osteoporosis.  -On history, she lost 2 inches of height over the years.    ROS: Limited as above.  PE: BP (!) 126/58   Pulse 68   Ht 5' (1.524 m)   Wt 135 lb 6.4 oz (61.4 kg)   BMI 26.44 kg/m , Body mass index is 26.44 kg/m. Wt Readings from Last 3 Encounters:  06/02/24 135 lb 6.4 oz (61.4 kg)  05/26/24 135 lb (61.2 kg)  05/08/24 133 lb 3.2 oz (60.4 kg)      CMP     Component Value Date/Time   NA 141 02/28/2024 1227   NA 143 05/28/2023 0947   NA 145 02/15/2017 1333   NA 144 08/12/2015 1419   K 4.5 02/28/2024 1227   K 4.4 02/15/2017 1333   K 4.2 08/12/2015 1419   CL 103 02/28/2024 1227   CL 104 02/15/2017 1333   CO2 33 (H) 02/28/2024 1227   CO2 28 02/15/2017 1333   CO2 29 08/12/2015 1419   GLUCOSE 153 (H) 02/28/2024 1227   GLUCOSE 142 (H) 02/15/2017 1333   BUN 11 02/28/2024 1227   BUN 6 (L) 05/28/2023 0947   BUN 13 02/15/2017 1333   BUN 11.8 08/12/2015 1419   CREATININE 0.89 02/28/2024 1227   CREATININE 0.82 03/24/2020 1122   CREATININE 0.8 08/12/2015 1419   CALCIUM  9.6 05/27/2024 1024   CALCIUM  10.2 02/15/2017 1333   CALCIUM  11.1 (H) 08/12/2015 1419   PROT 7.5 02/28/2024 1227   PROT 7.4 05/28/2023 0947   PROT 7.5 02/15/2017 1333   PROT 7.2 08/12/2015 1419   ALBUMIN 4.4 02/28/2024 1227   ALBUMIN 4.4 05/28/2023 0947   ALBUMIN 150 MG/L 07/15/2019 1130   ALBUMIN 3.9 08/12/2015 1419   AST 18 02/28/2024 1227   AST 26 08/12/2015 1419   ALT 17 02/28/2024 1227   ALT 39 02/15/2017 1333   ALT 32 08/12/2015 1419   ALKPHOS 74 02/28/2024 1227   ALKPHOS 76 02/15/2017 1333  ALKPHOS 74 08/12/2015 1419   BILITOT 0.8  02/28/2024 1227   BILITOT 0.52 08/12/2015 1419   GFRNONAA >60 02/28/2024 1227   GFRNONAA 67 03/24/2020 1122   GFRAA 77 03/24/2020 1122     Diabetic Labs (most recent): Lab Results  Component Value Date   HGBA1C 6.8 (A) 05/26/2024   HGBA1C 6.2 (A) 02/25/2024   HGBA1C 6.4 (A) 11/16/2023   MICROALBUR 6.6 (H) 02/25/2024   MICROALBUR 7.2 11/16/2023   MICROALBUR 292 12/10/2017     Lipid Panel ( most recent) Lipid Panel     Component Value Date/Time   CHOL 114 07/25/2023 0858   TRIG 149.0 07/25/2023 0858   HDL 33.20 (L) 07/25/2023 0858   CHOLHDL 3 07/25/2023 0858   VLDL 29.8 07/25/2023 0858   LDLCALC 51 07/25/2023 0858   LDLCALC 86 11/06/2019 0836   LDLDIRECT 91.0 03/18/2021 1039      Lab Results  Component Value Date   TSH 6.29 (H) 04/18/2021   TSH 5.27 (H) 03/18/2021   TSH 3.96 11/21/2019   TSH 4.00 03/17/2019   TSH 4.36 03/12/2018   TSH 2.93 03/12/2017   FREET4 0.58 (L) 04/18/2021      Assessment: 1. Hypercalcemia / Hyperparathyroidism  2.  Osteopenia  Plan: See notes from prior visits.  She is not a surgical candidate for primary hyperparathyroidism.  She returns with controlled calcium  at 9.6 mg per DL and PTH of 48.  She is advised to continue Sensipar  30 mg p.o. twice daily with breakfast and supper.    - Patient also  has vitamin D  deficiency on supplement-advised to continue vitamin D3 4000 units daily.  -PTH RP is normal, malignancy related hypercalcemia unlikely. -There seems to be a complication from hypercalcemia/hypocalcemia with 3 mm nonobstructive right sided nephrolithiasis. She also has osteopenia.  She did not get her repeat bone density before this visit.  I recommended she get this study as soon as possible.  no history of   osteoporosis,fragility fractures. No abdominal pain, no major mood disorders, no bone pain-  -She is not currently on bisphosphonates.  She will be contacted if her bone density is abnormal or any worsening compared to her  June 2023 study.  She has uncontrolled type 2 diabetes, recent A1c of 6.4%, addressing with her PMD.  She is advised to maintain close follow-up with her PCP.   I spent  22  minutes in the care of the patient today including review of labs from Thyroid  Function, CMP, and other relevant labs ; imaging/biopsy records (current and previous including abstractions from other facilities); face-to-face time discussing  her lab results and symptoms, medications doses, her options of short and long term treatment based on the latest standards of care / guidelines;   and documenting the encounter.  Foy PARAS Penagos  participated in the discussions, expressed understanding, and voiced agreement with the above plans.  All questions were answered to her satisfaction. she is encouraged to contact clinic should she have any questions or concerns prior to her return visit.    - Return in about 6 months (around 12/03/2024) for F/U with Pre-visit Labs, DXA Scan B4 NV.   Ranny Earl, MD [facility] [address] [place], [place] [address] Phone: [phone]  Fax: [phone]    This note was partially dictated with voice recognition software. Similar sounding words can be transcribed inadequately or may not  be corrected upon review.  06/02/2024, 4:50 PM

## 2024-06-06 ENCOUNTER — Ambulatory Visit (HOSPITAL_COMMUNITY)
Admission: RE | Admit: 2024-06-06 | Discharge: 2024-06-06 | Disposition: A | Discharge status: Home / Self Care | Source: Ambulatory Visit | Attending: "Endocrinology | Admitting: "Endocrinology

## 2024-06-06 DIAGNOSIS — Z780 Asymptomatic menopausal state: Secondary | ICD-10-CM | POA: Diagnosis not present

## 2024-06-06 DIAGNOSIS — M810 Age-related osteoporosis without current pathological fracture: Secondary | ICD-10-CM | POA: Diagnosis not present

## 2024-06-06 DIAGNOSIS — M8589 Other specified disorders of bone density and structure, multiple sites: Secondary | ICD-10-CM | POA: Diagnosis not present

## 2024-06-16 ENCOUNTER — Encounter: Payer: Self-pay | Admitting: "Endocrinology

## 2024-06-16 ENCOUNTER — Other Ambulatory Visit: Payer: Self-pay

## 2024-06-16 ENCOUNTER — Ambulatory Visit (INDEPENDENT_AMBULATORY_CARE_PROVIDER_SITE_OTHER): Admitting: "Endocrinology

## 2024-06-16 ENCOUNTER — Other Ambulatory Visit (HOSPITAL_COMMUNITY): Payer: Self-pay

## 2024-06-16 VITALS — BP 104/76 | HR 64 | Ht 60.0 in | Wt 133.2 lb

## 2024-06-16 DIAGNOSIS — E212 Other hyperparathyroidism: Secondary | ICD-10-CM

## 2024-06-16 DIAGNOSIS — M810 Age-related osteoporosis without current pathological fracture: Secondary | ICD-10-CM | POA: Diagnosis not present

## 2024-06-16 MED ORDER — ALENDRONATE SODIUM 70 MG PO TABS
70.0000 mg | ORAL_TABLET | ORAL | 3 refills | Status: AC
  Filled 2024-06-16: qty 12, 84d supply, fill #0
  Filled 2024-09-02: qty 12, 84d supply, fill #1
  Filled 2024-11-25: qty 12, 84d supply, fill #2

## 2024-06-16 NOTE — Progress Notes (Signed)
 06/16/2024, 1:25 PM       Endocrinology follow-up note   [name] is a 89 y.o.-year-old female, here for discuss her recent bone density scan results.  She is on regular follow-up in this clinic for hypercalcemia related to hyperparathyroidism.    PMD: her  Kennyth Worth HERO, MD    Past Medical History:  Diagnosis Date   Arthritis    Cancer [facility])    Diabetes mellitus without complication (HCC)    GERD (gastroesophageal reflux disease)    Hyperlipidemia    Hypertension    Kidney stones    Thyroid  disease     Past Surgical History:  Procedure Laterality Date   BACK SURGERY     BREAST BIOPSY  06/05/2023   MM RT RADIOACTIVE SEED LOC MAMMO GUIDE 06/05/2023 [facility] MAMMOGRAPHY   BREAST LUMPECTOMY WITH RADIOACTIVE SEED LOCALIZATION Right 06/06/2023   Procedure: RIGHT BREAST LUMPECTOMY WITH RADIOACTIVE SEED LOCALIZATION;  Surgeon: Belinda Cough, MD;  Location: [facility];  Service: General;  Laterality: Right;  LMA   BREAST SURGERY     MELANOMA EXCISION     TONSILLECTOMY      Social History   Tobacco Use   Smoking status: Former    Current packs/day: 0.00    Average packs/day: 1 pack/day for 15.0 years (15.0 ttl pk-yrs)    Types: Cigarettes    Start date: 09/14/1955    Quit date: 07/14/1969    Years since quitting: 54.9   Smokeless tobacco: Never   Tobacco comments:    quit 40 years ago  Vaping Use   Vaping status: Never Used  Substance Use Topics   Alcohol  use: Never    Alcohol /week: 0.0 standard drinks of alcohol    Drug use: Never    Family History  Problem Relation Age of Onset   Cancer Mother        unk type; dx > 70; XRT in neck area   Hypertension Father    Other Other        brain tumor    Outpatient Encounter Medications as of 06/16/2024  Medication Sig   alendronate  (FOSAMAX ) 70 MG tablet Take 1 tablet (70 mg total) by mouth every 7 (seven) days. Take with a full glass  of water on an empty stomach.   Cholecalciferol (VITAMIN D -3) 1000 UNITS CAPS Take 2,000 Units by mouth 2 (two) times daily.   cinacalcet  (SENSIPAR ) 30 MG tablet Take 1 tablet (30 mg total) by mouth 2 (two) times daily with a meal.   glucose blood (ONETOUCH VERIO) test strip USE TO TEST BLOOD SUGAR FOUR TIMES DAILY.   losartan  (COZAAR ) 100 MG tablet Take 1 tablet (100 mg total) by mouth daily. (Patient taking differently: Take 100 mg by mouth 2 (two) times daily.)   Multiple Vitamins-Minerals (CENTRUM SILVER 50+WOMEN PO) Take 1 tablet by mouth daily.   nitrofurantoin , macrocrystal-monohydrate, (MACROBID ) 100 MG capsule Take 1 capsule (100 mg total) by mouth 2 (two) times daily.   omeprazole  (PRILOSEC) 20 MG capsule TAKE 1 CAPSULE(20 MG) BY MOUTH DAILY   rosuvastatin  (CRESTOR ) 10 MG tablet Take 1 tablet (10 mg  total) by mouth daily.   sodium chloride (MURO 128) 2 % ophthalmic solution Place 1 drop into both eyes 2 (two) times daily.   tirzepatide  (MOUNJARO ) 5 MG/0.5ML Pen Inject 5 mg into the skin once a week.   vitamin C (ASCORBIC ACID) 250 MG tablet Take 250 mg by mouth daily.   No facility-administered encounter medications on file as of 06/16/2024.    No Known Allergies   HPI  [name] was diagnosed with hypercalcemia in May 2020.   Her previous workup indicated etiology of hypercalcemia to be primary hyperparathyroidism. Since she is not a surgical candidate, she was started on Sensipar ,  currently on 30 mg p.o. twice a day.  She continues to tolerate this medication.  She presents with controlled calcium  at 9.6 and PTH 48.   She previously was documented to have osteopenia with FRAX showing her risk of fracture of hip at 4.8%.  She was offered follow-up bone density now showing results in the osteoporosis range.  She denies any prior history of fractures.   She is known to have recurrent nephrolithiasis. She has diabetes controlled with recent A1c of 6.4% following up with her  PMD.  She remains on Mounjaro  5 mg subcutaneously weekly.  She has achieved 20 pounds of weight loss over the last 2 years.  She also has hyperlipidemia uncontrolled on Crestor  10 mg.  No history of CKD.   she is not on HCTZ or other thiazide therapy.  She is on vitamin D  supplement-4000 units daily.  She is not on calcium  supplements. she eats dairy and green, leafy, vegetables on average amounts.  she does not have a family history of hypercalcemia, pituitary tumors, thyroid  cancer, or osteoporosis.  -On history, she lost 2 inches of height over the years.   ROS: Limited as above.  PE: BP 104/76   Pulse 64   Ht 5' (1.524 m)   Wt 133 lb 3.2 oz (60.4 kg)   BMI 26.01 kg/m , Body mass index is 26.01 kg/m. Wt Readings from Last 3 Encounters:  06/16/24 133 lb 3.2 oz (60.4 kg)  06/02/24 135 lb 6.4 oz (61.4 kg)  05/26/24 135 lb (61.2 kg)      CMP     Component Value Date/Time   NA 141 02/28/2024 1227   NA 143 05/28/2023 0947   NA 145 02/15/2017 1333   NA 144 08/12/2015 1419   K 4.5 02/28/2024 1227   K 4.4 02/15/2017 1333   K 4.2 08/12/2015 1419   CL 103 02/28/2024 1227   CL 104 02/15/2017 1333   CO2 33 (H) 02/28/2024 1227   CO2 28 02/15/2017 1333   CO2 29 08/12/2015 1419   GLUCOSE 153 (H) 02/28/2024 1227   GLUCOSE 142 (H) 02/15/2017 1333   BUN 11 02/28/2024 1227   BUN 6 (L) 05/28/2023 0947   BUN 13 02/15/2017 1333   BUN 11.8 08/12/2015 1419   CREATININE 0.89 02/28/2024 1227   CREATININE 0.82 03/24/2020 1122   CREATININE 0.8 08/12/2015 1419   CALCIUM  9.6 05/27/2024 1024   CALCIUM  10.2 02/15/2017 1333   CALCIUM  11.1 (H) 08/12/2015 1419   PROT 7.5 02/28/2024 1227   PROT 7.4 05/28/2023 0947   PROT 7.5 02/15/2017 1333   PROT 7.2 08/12/2015 1419   ALBUMIN 4.4 02/28/2024 1227   ALBUMIN 4.4 05/28/2023 0947   ALBUMIN 150 MG/L 07/15/2019 1130   ALBUMIN 3.9 08/12/2015 1419   AST 18 02/28/2024 1227   AST 26 08/12/2015 1419  ALT 17 02/28/2024 1227   ALT 39 02/15/2017  1333   ALT 32 08/12/2015 1419   ALKPHOS 74 02/28/2024 1227   ALKPHOS 76 02/15/2017 1333   ALKPHOS 74 08/12/2015 1419   BILITOT 0.8 02/28/2024 1227   BILITOT 0.52 08/12/2015 1419   GFRNONAA >60 02/28/2024 1227   GFRNONAA 67 03/24/2020 1122   GFRAA 77 03/24/2020 1122     Diabetic Labs (most recent): Lab Results  Component Value Date   HGBA1C 6.8 (A) 05/26/2024   HGBA1C 6.2 (A) 02/25/2024   HGBA1C 6.4 (A) 11/16/2023   MICROALBUR 6.6 (H) 02/25/2024   MICROALBUR 7.2 11/16/2023   MICROALBUR 292 12/10/2017     Lipid Panel ( most recent) Lipid Panel     Component Value Date/Time   CHOL 114 07/25/2023 0858   TRIG 149.0 07/25/2023 0858   HDL 33.20 (L) 07/25/2023 0858   CHOLHDL 3 07/25/2023 0858   VLDL 29.8 07/25/2023 0858   LDLCALC 51 07/25/2023 0858   LDLCALC 86 11/06/2019 0836   LDLDIRECT 91.0 03/18/2021 1039      Lab Results  Component Value Date   TSH 6.29 (H) 04/18/2021   TSH 5.27 (H) 03/18/2021   TSH 3.96 11/21/2019   TSH 4.00 03/17/2019   TSH 4.36 03/12/2018   TSH 2.93 03/12/2017   FREET4 0.58 (L) 04/18/2021      Assessment: 1.  Osteoporosis-mainly affecting forearm  2. Hypercalcemia / Hyperparathyroidism  Plan: See notes from prior visits.  She is not a surgical candidate for primary hyperparathyroidism.    I discussed her bone density results and approach her with medication due to her high risk of fracture.  She does not have contraindication for bisphosphonate therapy.  She has full dentures, on dental follow-up as needed. She would like to avoid injectable treatment options.  She is still a good candidate for oral bisphosphonates.  I discussed and prescribed Fosamax  70 mg p.o. weekly.  Side effects and precautions discussed with her.   She will be considered for periodic bone density every 2 years to assess treatment response. Regarding her hypercalcemia from hyperparathyroidism, she recently returned with controlled calcium  at 9.6 mg per DL and PTH of  48.  She is advised to continue Sensipar  30 mg p.o. twice daily with breakfast and supper.    - Patient also  has vitamin D  deficiency on supplement-advised to continue vitamin D3 4000 units daily.  -PTH RP is normal, malignancy related hypercalcemia unlikely. -There seems to be a complication from hypercalcemia/hypocalcemia with 3 mm nonobstructive right sided nephrolithiasis, besides osteoporosis.  No abdominal pain, no major mood disorders, no bone pain-  She has uncontrolled type 2 diabetes, recent A1c of 6.4%, addressing with her PMD.  She is advised to maintain close follow-up with her PCP.  Any further weight loss for this patient is not advisable, unless it is as a result of lifestyle changes including increased exercise and not exclusively as a result of GLP-1 receptor agonist   I spent  22  minutes in the care of the patient today including review of labs from Thyroid  Function, CMP, and other relevant labs ; imaging/biopsy records (current and previous including abstractions from other facilities); face-to-face time discussing  her lab results and symptoms, medications doses, her options of short and long term treatment based on the latest standards of care / guidelines;   and documenting the encounter.  Foy PARAS Mcfall  participated in the discussions, expressed understanding, and voiced agreement with the above plans.  All  questions were answered to her satisfaction. she is encouraged to contact clinic should she have any questions or concerns prior to her return visit.  - Return for Keep Reg. Appt. with Pre-visit Labs.   Ranny Earl, MD [facility] [address] [place], [place] [address] Phone: [phone]  Fax: [phone]    This note was partially dictated with voice recognition software. Similar sounding words can be transcribed inadequately or may not  be corrected upon review.  06/16/2024, 1:25 PM

## 2024-06-25 ENCOUNTER — Other Ambulatory Visit (HOSPITAL_COMMUNITY): Payer: Self-pay

## 2024-06-25 ENCOUNTER — Other Ambulatory Visit: Payer: Self-pay | Admitting: Family Medicine

## 2024-06-25 ENCOUNTER — Telehealth: Payer: Self-pay | Admitting: *Deleted

## 2024-06-25 MED ORDER — MOUNJARO 5 MG/0.5ML ~~LOC~~ SOAJ
5.0000 mg | SUBCUTANEOUS | 0 refills | Status: DC
  Filled 2024-06-25: qty 2, 28d supply, fill #0

## 2024-06-25 NOTE — Telephone Encounter (Signed)
 Copied from CRM [id number]. Topic: Clinical - Medication Question >> Jun 25, 2024 10:09 AM Drema MATSU wrote: Reason for CRM: Patient is requesting a callback from the nurse regarding tirzepatide  (MOUNJARO ) 5 MG/0.5ML Pen.

## 2024-06-25 NOTE — Telephone Encounter (Signed)
 Patient will like to let PCP know medication Rx Mounjaro  working with her  Had glucose level 199 in only one time most of the time glucose running between 160-120 Prescription refill was send today

## 2024-06-26 NOTE — Telephone Encounter (Signed)
 I appreciate the update. I am glad it is working well for her.  Will see her back in October for her follow-up visit though she should reach out to us  if any new issues arise.

## 2024-07-14 ENCOUNTER — Other Ambulatory Visit: Payer: Self-pay | Admitting: Family Medicine

## 2024-07-14 ENCOUNTER — Other Ambulatory Visit (HOSPITAL_COMMUNITY): Payer: Self-pay

## 2024-07-15 ENCOUNTER — Other Ambulatory Visit (HOSPITAL_COMMUNITY): Payer: Self-pay

## 2024-07-15 ENCOUNTER — Other Ambulatory Visit: Payer: Self-pay

## 2024-07-15 NOTE — Telephone Encounter (Signed)
 Pharmacy comment: pt states she is taking bid

## 2024-07-15 NOTE — Telephone Encounter (Signed)
 Please clarify with patient. She should only be taking losartan  100 mg ONCE daily

## 2024-07-16 ENCOUNTER — Other Ambulatory Visit (HOSPITAL_COMMUNITY): Payer: Self-pay

## 2024-07-16 ENCOUNTER — Other Ambulatory Visit: Payer: Self-pay | Admitting: Family Medicine

## 2024-07-16 MED ORDER — MOUNJARO 5 MG/0.5ML ~~LOC~~ SOAJ
5.0000 mg | SUBCUTANEOUS | 0 refills | Status: DC
  Filled 2024-07-16: qty 2, 28d supply, fill #0

## 2024-07-17 ENCOUNTER — Other Ambulatory Visit (HOSPITAL_COMMUNITY): Payer: Self-pay

## 2024-07-18 ENCOUNTER — Other Ambulatory Visit (HOSPITAL_COMMUNITY): Payer: Self-pay

## 2024-07-18 ENCOUNTER — Other Ambulatory Visit: Payer: Self-pay | Admitting: Family Medicine

## 2024-07-18 ENCOUNTER — Ambulatory Visit: Payer: Self-pay

## 2024-07-18 MED ORDER — LOSARTAN POTASSIUM 100 MG PO TABS
100.0000 mg | ORAL_TABLET | Freq: Every day | ORAL | 1 refills | Status: DC
  Filled 2024-07-18: qty 90, 90d supply, fill #0
  Filled 2024-07-30: qty 30, 30d supply, fill #0
  Filled 2024-08-27: qty 30, 30d supply, fill #1
  Filled 2024-09-16 – 2024-09-19 (×2): qty 30, 30d supply, fill #2

## 2024-07-18 NOTE — Telephone Encounter (Signed)
 Left message to return call to our office at their convenience.

## 2024-07-18 NOTE — Telephone Encounter (Signed)
 FYI Only or Action Required?: Action required by provider: update on patient condition.  Patient was last seen in primary care on 05/26/2024 by Kennyth Worth HERO, MD.  Called Nurse Triage reporting Medication Problem.  Symptoms began no symptoms reported.  Interventions attempted: Nothing.  Symptoms are: no symptoms reported.  Triage Disposition: Call PCP Now  Patient/caregiver understands and will follow disposition?: Yes                             Copied from CRM [id number]. Topic: Clinical - Red Word Triage >> Jul 18, 2024  1:48 PM Mia F wrote: Red Word that prompted transfer to Nurse Triage: Pt is asking if she is taking the losartan  (COZAAR ) 100 MG tablet correctly. She currently takes it twice a day and pt would like to know if she should be taking the medication once or twice. Pt has been taking 2 a day for some months Reason for Disposition  [1] DOUBLE DOSE (an extra dose or lesser amount) of prescription drug AND [2] NO symptoms  (Exception: A double dose of antibiotics.)  Answer Assessment - Initial Assessment Questions 1. NAME of MEDICINE: What medicine(s) are you calling about?     Losartan  100 MG 2. QUESTION: What is your question? (e.g., double dose of medicine, side effect)     Patient has been taking losartan  100 MG twice a day, instead of once a day for months  3. PRESCRIBER: Who prescribed the medicine? Reason: if prescribed by specialist, call should be referred to that group.     PCP 4. SYMPTOMS: Do you have any symptoms? If Yes, ask: What symptoms are you having?  How bad are the symptoms (e.g., mild, moderate, severe)     Denies symptoms at this time, states BP has been normal    Patient stated she was instructed to take losartan  100 MG bid. Per chart review, this RN did not find any instructions from provider on taking losartan  100 MG bid.  Protocols used: Medication Question Call-A-AH  This RN advised patient to take  losartan  100 MG once a day, as prescribed. This RN advised that I would route conversation to provider and ask for his input on medication issue. Patient denied any symptoms at this time.

## 2024-07-21 ENCOUNTER — Telehealth: Payer: Self-pay | Admitting: *Deleted

## 2024-07-21 NOTE — Telephone Encounter (Signed)
 Copied from CRM #[id number]. Topic: Clinical - Medication Question >> Jul 18, 2024  4:32 PM Viola F wrote: Reason for CRM: Patient returned [facility] phone call   Spoke with patient, call to advise to take  Rx Losartan  100mg  daily Patient verbalized understanding

## 2024-07-23 ENCOUNTER — Other Ambulatory Visit (HOSPITAL_COMMUNITY): Payer: Self-pay

## 2024-07-28 ENCOUNTER — Other Ambulatory Visit: Payer: Self-pay | Admitting: Family Medicine

## 2024-07-28 ENCOUNTER — Other Ambulatory Visit: Payer: Self-pay

## 2024-07-28 MED ORDER — ONETOUCH VERIO VI STRP
ORAL_STRIP | 3 refills | Status: DC
  Filled 2024-07-28: qty 100, 25d supply, fill #0
  Filled 2024-08-16: qty 100, 25d supply, fill #1
  Filled 2024-09-10: qty 100, 25d supply, fill #2
  Filled 2024-10-05: qty 100, 25d supply, fill #3

## 2024-07-30 ENCOUNTER — Other Ambulatory Visit (HOSPITAL_COMMUNITY): Payer: Self-pay

## 2024-07-30 ENCOUNTER — Other Ambulatory Visit: Payer: Self-pay | Admitting: Family Medicine

## 2024-07-30 ENCOUNTER — Other Ambulatory Visit: Payer: Self-pay

## 2024-07-30 DIAGNOSIS — C50919 Malignant neoplasm of unspecified site of unspecified female breast: Secondary | ICD-10-CM

## 2024-07-30 DIAGNOSIS — E785 Hyperlipidemia, unspecified: Secondary | ICD-10-CM

## 2024-07-31 ENCOUNTER — Other Ambulatory Visit (HOSPITAL_COMMUNITY): Payer: Self-pay

## 2024-07-31 ENCOUNTER — Other Ambulatory Visit: Payer: Self-pay

## 2024-07-31 MED ORDER — ROSUVASTATIN CALCIUM 10 MG PO TABS
10.0000 mg | ORAL_TABLET | Freq: Every day | ORAL | 1 refills | Status: AC
  Filled 2024-07-31: qty 90, 90d supply, fill #0
  Filled 2024-11-18: qty 90, 90d supply, fill #1

## 2024-07-31 MED ORDER — OMEPRAZOLE 20 MG PO CPDR
DELAYED_RELEASE_CAPSULE | ORAL | 3 refills | Status: AC
  Filled 2024-07-31: qty 90, 90d supply, fill #0
  Filled 2024-11-15: qty 90, 90d supply, fill #1

## 2024-08-13 ENCOUNTER — Other Ambulatory Visit: Payer: Self-pay

## 2024-08-13 ENCOUNTER — Other Ambulatory Visit (HOSPITAL_COMMUNITY): Payer: Self-pay

## 2024-08-13 ENCOUNTER — Other Ambulatory Visit: Payer: Self-pay | Admitting: Family Medicine

## 2024-08-13 MED ORDER — MOUNJARO 5 MG/0.5ML ~~LOC~~ SOAJ
5.0000 mg | SUBCUTANEOUS | 0 refills | Status: DC
  Filled 2024-08-13: qty 2, 28d supply, fill #0

## 2024-08-18 ENCOUNTER — Other Ambulatory Visit (HOSPITAL_COMMUNITY): Payer: Self-pay

## 2024-08-25 ENCOUNTER — Other Ambulatory Visit (HOSPITAL_COMMUNITY): Payer: Self-pay

## 2024-08-25 ENCOUNTER — Ambulatory Visit: Admitting: Family Medicine

## 2024-08-25 ENCOUNTER — Encounter: Payer: Self-pay | Admitting: Family Medicine

## 2024-08-25 VITALS — BP 120/62 | HR 76 | Temp 97.9°F | Ht 60.0 in | Wt 134.8 lb

## 2024-08-25 DIAGNOSIS — Z23 Encounter for immunization: Secondary | ICD-10-CM | POA: Diagnosis not present

## 2024-08-25 DIAGNOSIS — I152 Hypertension secondary to endocrine disorders: Secondary | ICD-10-CM | POA: Diagnosis not present

## 2024-08-25 DIAGNOSIS — E1159 Type 2 diabetes mellitus with other circulatory complications: Secondary | ICD-10-CM | POA: Diagnosis not present

## 2024-08-25 DIAGNOSIS — H6123 Impacted cerumen, bilateral: Secondary | ICD-10-CM

## 2024-08-25 DIAGNOSIS — E119 Type 2 diabetes mellitus without complications: Secondary | ICD-10-CM

## 2024-08-25 LAB — POCT GLYCOSYLATED HEMOGLOBIN (HGB A1C): Hemoglobin A1C: 5.8 % — AB (ref 4.0–5.6)

## 2024-08-25 MED ORDER — MOUNJARO 5 MG/0.5ML ~~LOC~~ SOAJ
5.0000 mg | SUBCUTANEOUS | 3 refills | Status: AC
  Filled 2024-08-25 – 2024-09-10 (×2): qty 6, 84d supply, fill #0
  Filled 2024-11-27: qty 6, 84d supply, fill #1

## 2024-08-25 NOTE — Assessment & Plan Note (Signed)
 A1c very well-controlled at 5.8.  She believes that she is on 2.5 mg weekly though she was prescribed 5 mg weekly at her most recent office visit here.  She will double check when she gets home.  She has had a few readings into the low 200s and upper 180s and would like tighter control of possible.  She will double check the dose that she is on when she gets home.  If she is on 2.5 then she can go to 5 mg as previously prescribed or if she is on 5 mg then we can increase at 7.5 mg weekly.

## 2024-08-25 NOTE — Patient Instructions (Signed)
 It was very nice to see you today!  VISIT SUMMARY: Today, we reviewed your blood sugar control and discussed your diabetes management. We also addressed your concerns about ear wax buildup and administered your annual flu shot.  YOUR PLAN: TYPE 2 DIABETES MELLITUS: Your diabetes is well-controlled with an A1c of 5.8%, though there are occasional spikes in your blood sugar levels related to your diet. -Verify your current Mounjaro  dosage at home and inform us . If you are on 2.5 mg, increase the dose to 5 mg weekly. If your glucose levels do not improve on 5 mg, we may consider increasing to 7.5 mg.  CERUMEN IMPACTION: You have some ear wax buildup, but it is not significantly affecting your hearing. -We performed ear irrigation today to help clear the wax.  IMMUNIZATION: You are due for your annual flu shot. -We administered your influenza vaccine today.  Return in about 3 months (around 11/25/2024) for Follow Up.   Take care, Dr Kennyth  PLEASE NOTE:  If you had any lab tests, please let us  know if you have not heard back within a few days. You may see your results on mychart before we have a chance to review them but we will give you a call once they are reviewed by us .   If we ordered any referrals today, please let us  know if you have not heard from their office within the next week.   If you had any urgent prescriptions sent in today, please check with the pharmacy within an hour of our visit to make sure the prescription was transmitted appropriately.   Please try these tips to maintain a healthy lifestyle:  Eat at least 3 REAL meals and 1-2 snacks per day.  Aim for no more than 5 hours between eating.  If you eat breakfast, please do so within one hour of getting up.   Each meal should contain half fruits/vegetables, one quarter protein, and one quarter carbs (no bigger than a computer mouse)  Cut down on sweet beverages. This includes juice, soda, and sweet tea.   Drink at  least 1 glass of water with each meal and aim for at least 8 glasses per day  Exercise at least 150 minutes every week.

## 2024-08-25 NOTE — Progress Notes (Addendum)
   [name] is a 89 y.o. female who presents today for an office visit.  Assessment/Plan:  New/Acute Problems: Cerumen Impaction  Successfully irrigated by RMA today. she can use over-the-counter Debrox or hydrogen peroxide as needed to prevent future buildup.  Chronic Problems Addressed Today: Diabetes mellitus without complication (HCC) A1c very well-controlled at 5.8.  She believes that she is on 2.5 mg weekly though she was prescribed 5 mg weekly at her most recent office visit here.  She will double check when she gets home.  She has had a few readings into the low 200s and upper 180s and would like tighter control of possible.  She will double check the dose that she is on when she gets home.  If she is on 2.5 then she can go to 5 mg as previously prescribed or if she is on 5 mg then we can increase at 7.5 mg weekly.  Hypertension associated with diabetes (HCC) At goal today on losartan  100 mg daily.  Flu vaccine given today.     Subjective:  HPI:  See assessment / plan for status of chronic conditions.    Discussed the use of AI scribe software for clinical note transcription with the patient, who gave verbal consent to proceed.  History of Present Illness [name] is an 89 year old female with type 2 diabetes who presents for follow-up of her blood sugar control.  Her blood sugar levels have been generally stable, with occasional readings in the 200s, attributed to dietary choices. Her A1c has improved to 5.8, the lowest it has been in years, previously being in the 8 range. She is currently on Mounjaro , with some uncertainty about whether she is on the 2.5 mg or 5 mg dose.  She mentions that her ears tend to build up wax, which she fears might affect her hearing. She has difficulty inserting her hearing aids due to their small size and lack of grip.         Objective:  Physical Exam: BP 120/62   Pulse 76   Temp 97.9 F (36.6 C) (Temporal)   Ht 5'  (1.524 m)   Wt 134 lb 12.8 oz (61.1 kg)   SpO2 98%   BMI 26.33 kg/m   Gen: No acute distress, resting comfortably HEENT: Bilateral EACs obscured by small amount of cerumen. CV: Regular rate and rhythm with no murmurs appreciated Pulm: Normal work of breathing, clear to auscultation bilaterally with no crackles, wheezes, or rhonchi Neuro: Grossly normal, moves all extremities Psych: Normal affect and thought content  Procedure Note:  Verbal Consent obtained after discussion of risk and benefits as well as alternative.  Bilateral EAC were irrigated with warm water.  She tolerated well.      Worth HERO. Kennyth, MD 08/25/2024 10:51 AM

## 2024-08-25 NOTE — Assessment & Plan Note (Signed)
 At goal today on losartan 100 mg daily.

## 2024-08-26 ENCOUNTER — Telehealth: Payer: Self-pay | Admitting: *Deleted

## 2024-08-26 NOTE — Telephone Encounter (Signed)
 Copied from CRM [id number]. Topic: General - Call Back - No Documentation >> Aug 25, 2024  4:57 PM Alfonso ORN wrote: Reason for CRM: pt calling to leave message :confirming camila is 5.0 and stated she has correct medication   LVM confirming Rx Monjaro is 5.0 weekly  Hester Joslin,RMA

## 2024-08-27 ENCOUNTER — Other Ambulatory Visit (HOSPITAL_COMMUNITY): Payer: Self-pay

## 2024-09-01 ENCOUNTER — Inpatient Hospital Stay: Attending: Hematology & Oncology

## 2024-09-01 ENCOUNTER — Inpatient Hospital Stay: Admitting: Hematology & Oncology

## 2024-09-01 ENCOUNTER — Encounter: Payer: Self-pay | Admitting: Hematology & Oncology

## 2024-09-01 ENCOUNTER — Other Ambulatory Visit: Payer: Self-pay

## 2024-09-01 VITALS — BP 128/67 | HR 69 | Temp 98.6°F | Resp 16 | Ht 60.0 in | Wt 135.0 lb

## 2024-09-01 DIAGNOSIS — Z170 Estrogen receptor positive status [ER+]: Secondary | ICD-10-CM | POA: Diagnosis not present

## 2024-09-01 DIAGNOSIS — Z79899 Other long term (current) drug therapy: Secondary | ICD-10-CM | POA: Diagnosis not present

## 2024-09-01 DIAGNOSIS — Z7983 Long term (current) use of bisphosphonates: Secondary | ICD-10-CM | POA: Diagnosis not present

## 2024-09-01 DIAGNOSIS — C50011 Malignant neoplasm of nipple and areola, right female breast: Secondary | ICD-10-CM | POA: Diagnosis not present

## 2024-09-01 DIAGNOSIS — D0511 Intraductal carcinoma in situ of right breast: Secondary | ICD-10-CM | POA: Diagnosis not present

## 2024-09-01 DIAGNOSIS — Z853 Personal history of malignant neoplasm of breast: Secondary | ICD-10-CM | POA: Insufficient documentation

## 2024-09-01 DIAGNOSIS — Z87442 Personal history of urinary calculi: Secondary | ICD-10-CM | POA: Diagnosis not present

## 2024-09-01 DIAGNOSIS — C50012 Malignant neoplasm of nipple and areola, left female breast: Secondary | ICD-10-CM | POA: Diagnosis not present

## 2024-09-01 DIAGNOSIS — E119 Type 2 diabetes mellitus without complications: Secondary | ICD-10-CM

## 2024-09-01 LAB — CMP (CANCER CENTER ONLY)
ALT: 17 U/L (ref 0–44)
AST: 20 U/L (ref 15–41)
Albumin: 4.2 g/dL (ref 3.5–5.0)
Alkaline Phosphatase: 76 U/L (ref 38–126)
Anion gap: 10 (ref 5–15)
BUN: 10 mg/dL (ref 8–23)
CO2: 27 mmol/L (ref 22–32)
Calcium: 9.1 mg/dL (ref 8.9–10.3)
Chloride: 104 mmol/L (ref 98–111)
Creatinine: 0.84 mg/dL (ref 0.44–1.00)
GFR, Estimated: >60 mL/min (ref >60)
Glucose, Bld: 135 mg/dL — ABNORMAL HIGH (ref 70–99)
Potassium: 4.1 mmol/L (ref 3.5–5.1)
Sodium: 142 mmol/L (ref 135–145)
Total Bilirubin: 0.5 mg/dL (ref 0.0–1.2)
Total Protein: 7.4 g/dL (ref 6.5–8.1)

## 2024-09-01 LAB — CBC WITH DIFFERENTIAL (CANCER CENTER ONLY)
Abs Immature Granulocytes: 0.01 K/uL (ref 0.00–0.07)
Basophils Absolute: 0.1 K/uL (ref 0.0–0.1)
Basophils Relative: 1 %
Eosinophils Absolute: 0.2 K/uL (ref 0.0–0.5)
Eosinophils Relative: 3 %
HCT: 42.2 % (ref 36.0–46.0)
Hemoglobin: 14.7 g/dL (ref 12.0–15.0)
Immature Granulocytes: 0 %
Lymphocytes Relative: 38 %
Lymphs Abs: 2.6 K/uL (ref 0.7–4.0)
MCH: 30.5 pg (ref 26.0–34.0)
MCHC: 34.8 g/dL (ref 30.0–36.0)
MCV: 87.6 fL (ref 80.0–100.0)
Monocytes Absolute: 0.5 K/uL (ref 0.1–1.0)
Monocytes Relative: 7 %
Neutro Abs: 3.5 K/uL (ref 1.7–7.7)
Neutrophils Relative %: 51 %
Platelet Count: 258 K/uL (ref 150–400)
RBC: 4.82 MIL/uL (ref 3.87–5.11)
RDW: 13.1 % (ref 11.5–15.5)
WBC Count: 6.7 K/uL (ref 4.0–10.5)
nRBC: 0 % (ref 0.0–0.2)

## 2024-09-01 NOTE — Addendum Note (Signed)
 Addended by: Ailine Hefferan on: 09/01/2024 12:22 PM   Modules accepted: Orders

## 2024-09-01 NOTE — Progress Notes (Signed)
 Hematology and Oncology Follow Up Visit  [name] [id number] Jan 14, 1935 89 y.o. 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 is back for follow-up.  We last saw her back in May.  Since then, she really has done well.  She has had a nice summer.  She will be going down to [facility] for a [employer].  I am so happy for her.  Next April should be going up to [place]  for another [employer].  She feels well.  She has had no complaints.  She has had no nausea or vomiting.  She has avoided COVID.  She has had no issues with bowels or bladder.  There has been no bleeding.  She has had no rashes.  There is been no headache.  Of note, she does have bilateral kidney stones.  So far, they have not caused any difficulties for her.  Hopefully, they will not because if they do, she would need surgery to remove them.    Overall, I would have to say that her performance status is probably ECOG 2.   Medications:  Current Outpatient Medications:    alendronate  (FOSAMAX ) 70 MG tablet, Take 1 tablet (70 mg total) by mouth every 7 (seven) days. Take with a full glass of water on an empty stomach., Disp: 12 tablet, Rfl: 3   Cholecalciferol (VITAMIN D -3) 1000 UNITS CAPS, Take 2,000 Units by mouth 2 (two) times daily., Disp: , Rfl:    cinacalcet  (SENSIPAR ) 30 MG tablet, Take 1 tablet (30 mg total) by mouth 2 (two) times daily with a meal., Disp: 180 tablet, Rfl: 1   glucose blood (ONETOUCH VERIO) test strip, USE TO TEST BLOOD SUGAR FOUR TIMES DAILY., Disp: 100 strip, Rfl: 3   losartan  (COZAAR ) 100 MG tablet, Take 1 tablet (100 mg total) by mouth daily., Disp: 90 tablet, Rfl: 1   Multiple Vitamins-Minerals (CENTRUM SILVER 50+WOMEN PO), Take 1 tablet by mouth daily., Disp: , Rfl:    nitrofurantoin ,  macrocrystal-monohydrate, (MACROBID ) 100 MG capsule, Take 1 capsule (100 mg total) by mouth 2 (two) times daily., Disp: 14 capsule, Rfl: 0   omeprazole  (PRILOSEC) 20 MG capsule, TAKE 1 CAPSULE(20 MG) BY MOUTH DAILY, Disp: 90 capsule, Rfl: 3   rosuvastatin  (CRESTOR ) 10 MG tablet, Take 1 tablet (10 mg total) by mouth daily., Disp: 90 tablet, Rfl: 1   sodium chloride (MURO 128) 2 % ophthalmic solution, Place 1 drop into both eyes 2 (two) times daily., Disp: , Rfl:    tirzepatide  (MOUNJARO ) 5 MG/0.5ML Pen, Inject 5 mg into the skin once a week., Disp: 6 mL, Rfl: 3   vitamin C (ASCORBIC ACID) 250 MG tablet, Take 250 mg by mouth daily., Disp: , Rfl:   Allergies: No Known Allergies  Past Medical History, Surgical history, Social history, and Family History were reviewed and updated.  Review of Systems: Review of Systems  Constitutional: Negative.   HENT:  Negative.    Eyes: Negative.   Respiratory: Negative.    Cardiovascular: Negative.   Gastrointestinal: Negative.   Endocrine: Negative.   Musculoskeletal: Negative.   Skin: Negative.   Neurological: Negative.   Hematological: Negative.   Psychiatric/Behavioral: Negative.      Physical Exam:  Vital signs show temperature 98.6.  Pulse 69.  Blood pressure 128/67.  Weight is 135 pounds.  Wt Readings from Last 3 Encounters:  08/25/24 134 lb 12.8 oz (61.1 kg)  06/16/24 133 lb 3.2 oz (60.4 kg)  06/02/24 135 lb 6.4 oz (61.4 kg)    Physical Exam Vitals reviewed.  Constitutional:      Comments: Her right breast shows the lumpectomy scar at the edge of the areola.  This is about the 11 o'clock position.  It is well-healed.  There is no mass in the right breast.  There is no nipple discharge on the right side.  There is no right axillary adenopathy.  Left breast shows a well-healed lumpectomy at about the 7 o'clock position.  There is no mass in the left breast.  There is no left axillary adenopathy.  HENT:     Head: Normocephalic and  atraumatic.  Eyes:     Pupils: Pupils are equal, round, and reactive to light.  Cardiovascular:     Rate and Rhythm: Normal rate and regular rhythm.     Heart sounds: Normal heart sounds.  Pulmonary:     Effort: Pulmonary effort is normal.     Breath sounds: Normal breath sounds.  Abdominal:     General: Bowel sounds are normal.     Palpations: Abdomen is soft.  Musculoskeletal:        General: No tenderness or deformity. Normal range of motion.     Cervical back: Normal range of motion.  Lymphadenopathy:     Cervical: No cervical adenopathy.  Skin:    General: Skin is warm and dry.     Findings: No erythema or rash.  Neurological:     Mental Status: She is alert and oriented to person, place, and time.  Psychiatric:        Behavior: Behavior normal.        Thought Content: Thought content normal.        Judgment: Judgment normal.      Lab Results  Component Value Date   WBC 6.7 09/01/2024   HGB 14.7 09/01/2024   HCT 42.2 09/01/2024   MCV 87.6 09/01/2024   PLT 258 09/01/2024     Chemistry      Component Value Date/Time   NA 142 09/01/2024 1038   NA 143 05/28/2023 0947   NA 145 02/15/2017 1333   NA 144 08/12/2015 1419   K 4.1 09/01/2024 1038   K 4.4 02/15/2017 1333   K 4.2 08/12/2015 1419   CL 104 09/01/2024 1038   CL 104 02/15/2017 1333   CO2 27 09/01/2024 1038   CO2 28 02/15/2017 1333   CO2 29 08/12/2015 1419   BUN 10 09/01/2024 1038   BUN 6 (L) 05/28/2023 0947   BUN 13 02/15/2017 1333   BUN 11.8 08/12/2015 1419   CREATININE 0.84 09/01/2024 1038   CREATININE 0.82 03/24/2020 1122   CREATININE 0.8 08/12/2015 1419   GLU 197 09/18/2018 0000      Component Value Date/Time   CALCIUM  9.1 09/01/2024 1038   CALCIUM  10.2 02/15/2017 1333   CALCIUM  11.1 (H) 08/12/2015 1419   ALKPHOS 76 09/01/2024 1038   ALKPHOS 76 02/15/2017 1333   ALKPHOS 74 08/12/2015 1419   AST 20 09/01/2024 1038   AST 26 08/12/2015 1419   ALT 17 09/01/2024 1038   ALT 39 02/15/2017 1333    ALT 32 08/12/2015 1419   BILITOT 0.5 09/01/2024 1038   BILITOT 0.52 08/12/2015 1419       Impression and Plan: [name] is a very charming 89 year old postmenopausal white female.  She was diagnosed with DCIS of the right breast.  This was in August 2024.  She underwent a lumpectomy.  I do not feel that there is any indication for adjuvant therapy.  We will plan to get her back in 6 months.    I am just so impressed with how well she is doing.  So far, she really is having a good quality of life.  Hopefully this will continue.   Maude JONELLE Crease, MD 11/3/202511:30 AM

## 2024-09-02 DIAGNOSIS — H353132 Nonexudative age-related macular degeneration, bilateral, intermediate dry stage: Secondary | ICD-10-CM | POA: Diagnosis not present

## 2024-09-02 DIAGNOSIS — E119 Type 2 diabetes mellitus without complications: Secondary | ICD-10-CM | POA: Diagnosis not present

## 2024-09-02 DIAGNOSIS — H18593 Other hereditary corneal dystrophies, bilateral: Secondary | ICD-10-CM | POA: Diagnosis not present

## 2024-09-02 DIAGNOSIS — D3131 Benign neoplasm of right choroid: Secondary | ICD-10-CM | POA: Diagnosis not present

## 2024-09-02 DIAGNOSIS — H02831 Dermatochalasis of right upper eyelid: Secondary | ICD-10-CM | POA: Diagnosis not present

## 2024-09-02 DIAGNOSIS — Z961 Presence of intraocular lens: Secondary | ICD-10-CM | POA: Diagnosis not present

## 2024-09-02 DIAGNOSIS — H04123 Dry eye syndrome of bilateral lacrimal glands: Secondary | ICD-10-CM | POA: Diagnosis not present

## 2024-09-02 DIAGNOSIS — H02834 Dermatochalasis of left upper eyelid: Secondary | ICD-10-CM | POA: Diagnosis not present

## 2024-09-02 LAB — OPHTHALMOLOGY REPORT-SCANNED

## 2024-09-03 ENCOUNTER — Other Ambulatory Visit (HOSPITAL_COMMUNITY): Payer: Self-pay

## 2024-09-10 ENCOUNTER — Other Ambulatory Visit (HOSPITAL_COMMUNITY): Payer: Self-pay

## 2024-09-10 ENCOUNTER — Other Ambulatory Visit: Payer: Self-pay

## 2024-09-16 ENCOUNTER — Other Ambulatory Visit (HOSPITAL_COMMUNITY): Payer: Self-pay

## 2024-09-17 ENCOUNTER — Ambulatory Visit

## 2024-09-17 VITALS — BP 128/67 | Ht 63.0 in | Wt 135.0 lb

## 2024-09-17 DIAGNOSIS — Z0000 Encounter for general adult medical examination without abnormal findings: Secondary | ICD-10-CM

## 2024-09-17 NOTE — Progress Notes (Signed)
 Chief Complaint  Patient presents with   Medicare Wellness     Subjective:   [name] is a 89 y.o. female who presents for a Medicare Annual Wellness Visit.  Allergies (verified) Patient has no known allergies.   History: Past Medical History:  Diagnosis Date   Arthritis    Cancer (HCC)    Diabetes mellitus without complication (HCC)    GERD (gastroesophageal reflux disease)    Hyperlipidemia    Hypertension    Kidney stones    Thyroid  disease    Past Surgical History:  Procedure Laterality Date   BACK SURGERY     BREAST BIOPSY  06/05/2023   MM RT RADIOACTIVE SEED LOC MAMMO GUIDE 06/05/2023 [facility] MAMMO[facility]AP[facility]   BREAST LUMPECTOMY WITH RADIOACTIVE SEED LOCALIZATION Right 06/06/2023   Procedure: RIGHT BREAST LUMPECTOMY WITH RADIOACTIVE SEED LOCALIZATION;  Surgeon: Belinda Cough, MD;  Location: [facility];  Service: General;  Laterality: Right;  LMA   BREAST SURGERY     MELANOMA EXCISION     TONSILLECTOMY     Family History  Problem Relation Age of Onset   Cancer Mother        unk type; dx > 70; XRT in neck area   Hypertension Father    Other Other        brain tumor   Social History   Occupational History   Occupation: retired  Tobacco Use   Smoking status: Former    Current packs/day: 0.00    Average packs/day: 1 pack/day for 15.0 years (15.0 ttl pk-yrs)    Types: Cigarettes    Start date: 09/14/1955    Quit date: 07/14/1969    Years since quitting: 55.2   Smokeless tobacco: Never   Tobacco comments:    quit 40 years ago  Vaping Use   Vaping status: Never Used  Substance and Sexual Activity   Alcohol  use: Never    Alcohol /week: 0.0 standard drinks of alcohol    Drug use: Never   Sexual activity: Not Currently   Tobacco Counseling Counseling given: Not Answered Tobacco comments: quit 40 years ago  SDOH Screenings   Food Insecurity: No Food Insecurity (09/17/2024)  Housing: Unknown (09/17/2024)  Transportation Needs: No  Transportation Needs (09/17/2024)  Utilities: Not At Risk (09/17/2024)  Alcohol  Screen: Low Risk  (07/10/2022)  Depression (PHQ2-9): Low Risk  (09/17/2024)  Financial Resource Strain: Low Risk  (07/10/2022)  Physical Activity: Sufficiently Active (09/17/2024)  Social Connections: Moderately Integrated (09/17/2024)  Stress: No Stress Concern Present (09/17/2024)  Tobacco Use: Medium Risk (09/17/2024)  Health Literacy: Adequate Health Literacy (09/17/2024)   See flowsheets for full screening details  Depression Screen PHQ 2 & 9 Depression Scale- Over the past 2 weeks, how often have you been bothered by any of the following problems? Little interest or pleasure in doing things: 0 Feeling down, depressed, or hopeless (PHQ Adolescent also includes...irritable): 0 PHQ-2 Total Score: 0     Goals Addressed               This Visit's Progress     maintain health and activity (pt-stated)        Maintain health and activity        Visit info / Clinical Intake: Persons participating in visit:: patient Medicare Wellness Visit Mode:: Telephone If telephone:: video declined Because this visit was a virtual/telehealth visit:: vitals recorded from last visit If Telephone or Video please confirm:: I connected with the patient using audio enabled telemedicine application and verified  that I am speaking with the correct person using two identifiers; I discussed the limitations of evaluation and management by telemedicine; The patient expressed understanding and agreed to proceed Patient Location:: home Provider Location:: home office Information given by:: patient Interpreter Needed?: No Pre-visit prep was completed: yes AWV questionnaire completed by patient prior to visit?: no Living arrangements:: with family/others Patient's Overall Health Status Rating: excellent Typical amount of pain: none Does pain affect daily life?: no Are you currently prescribed opioids?: no  Dietary Habits  and Nutritional Risks How many meals a day?: 3 Eats fruit and vegetables daily?: yes Most meals are obtained by: eating out; preparing own meals In the last 2 weeks, have you had any of the following?: none Diabetic:: (!) yes Any non-healing wounds?: no How often do you check your BS?: 2 Would you like to be referred to a Nutritionist or for Diabetic Management? : no  Functional Status Activities of Daily Living (to include ambulation/medication): Independent Ambulation: Independent with device- listed below Home Assistive Devices/Equipment: Eyeglasses Medication Administration: Independent Home Management: Independent Manage your own finances?: yes Primary transportation is: driving Concerns about vision?: no *vision screening is required for WTM* Concerns about hearing?: (!) yes Uses hearing aids?: (!) yes (hearing aids) Hear whispered voice?: yes  Fall Screening Falls in the past year?: 0 Number of falls in past year: 0 Was there an injury with Fall?: 0 Fall Risk Category Calculator: 0 Patient Fall Risk Level: Low Fall Risk  Fall Risk Patient at Risk for Falls Due to: No Fall Risks Fall risk Follow up: Falls prevention discussed  Home and Transportation Safety: All rugs have non-skid backing?: N/A, no rugs All stairs or steps have railings?: N/A, no stairs Grab bars in the bathtub or shower?: yes Have non-skid surface in bathtub or shower?: yes Good home lighting?: yes Regular seat belt use?: yes Hospital stays in the last year:: no  Cognitive Assessment Difficulty concentrating, remembering, or making decisions? : no Will 6CIT or Mini Cog be Completed: no 6CIT or Mini Cog Declined: patient alert, oriented, able to answer questions appropriately and recall recent events  Advance Directives (For Healthcare) Does Patient Have a Medical Advance Directive?: Yes Does patient want to make changes to medical advance directive?: No - Patient declined Type of Advance  Directive: Healthcare Power of Attorney Copy of Healthcare Power of Attorney in Chart?: No - copy requested  Reviewed/Updated  Reviewed/Updated: Reviewed All (Medical, Surgical, Family, Medications, Allergies, Care Teams, Patient Goals)        Objective:    Today's Vitals   09/17/24 1121  BP: 128/67  Weight: 135 lb (61.2 kg)  Height: 5' 3 (1.6 m)   Body mass index is 23.91 kg/m.  Current Medications (verified) Outpatient Encounter Medications as of 09/17/2024  Medication Sig   alendronate  (FOSAMAX ) 70 MG tablet Take 1 tablet (70 mg total) by mouth every 7 (seven) days. Take with a full glass of water on an empty stomach.   Cholecalciferol (VITAMIN D -3) 1000 UNITS CAPS Take 2,000 Units by mouth 2 (two) times daily.   cinacalcet  (SENSIPAR ) 30 MG tablet Take 1 tablet (30 mg total) by mouth 2 (two) times daily with a meal.   glucose blood (ONETOUCH VERIO) test strip USE TO TEST BLOOD SUGAR FOUR TIMES DAILY.   losartan  (COZAAR ) 100 MG tablet Take 1 tablet (100 mg total) by mouth daily.   Multiple Vitamins-Minerals (CENTRUM SILVER 50+WOMEN PO) Take 1 tablet by mouth daily.   Multiple Vitamins-Minerals (ICAPS AREDS  2 PO) Take by mouth.   omeprazole  (PRILOSEC) 20 MG capsule TAKE 1 CAPSULE(20 MG) BY MOUTH DAILY   rosuvastatin  (CRESTOR ) 10 MG tablet Take 1 tablet (10 mg total) by mouth daily.   sodium chloride (MURO 128) 2 % ophthalmic solution Place 1 drop into both eyes 2 (two) times daily.   tirzepatide  (MOUNJARO ) 5 MG/0.5ML Pen Inject 5 mg into the skin once a week.   vitamin C (ASCORBIC ACID) 250 MG tablet Take 250 mg by mouth daily.   No facility-administered encounter medications on file as of 09/17/2024.   Hearing/Vision screen Hearing Screening - Comments:: Pt wears hering aids  Vision Screening - Comments:: Wears rx glasses - up to date with routine eye exams with DR Octavia  Immunizations and Health Maintenance Health Maintenance  Topic Date Due   COVID-19 Vaccine (5 -  2025-26 season) 06/30/2024   HEMOGLOBIN A1C  02/23/2025   FOOT EXAM  05/09/2025   Mammogram  05/13/2025   OPHTHALMOLOGY EXAM  09/02/2025   Medicare Annual Wellness (AWV)  09/17/2025   DTaP/Tdap/Td (3 - Td or Tdap) 04/29/2032   Pneumococcal Vaccine: 50+ Years  Completed   Influenza Vaccine  Completed   DEXA SCAN  Completed   Zoster Vaccines- Shingrix  Completed   Meningococcal B Vaccine  Aged Out        Assessment/Plan:  This is a routine wellness examination for Makinze.  Patient Care Team: Kennyth Worth HERO, MD as PCP - General (Family Medicine) [facility] Pandora Cadet, [facility] as Pharmacist (Pharmacist) Timmy Maude SAUNDERS, MD as Medical Oncologist (Oncology) Belinda Cough, MD as Consulting Physician (General Surgery)  I have personally reviewed and noted the following in the patient's chart:   Medical and social history Use of alcohol , tobacco or illicit drugs  Current medications and supplements including opioid prescriptions. Functional ability and status Nutritional status Physical activity Advanced directives List of other physicians Hospitalizations, surgeries, and ER visits in previous 12 months Vitals Screenings to include cognitive, depression, and falls Referrals and appointments  No orders of the defined types were placed in this encounter.  In addition, I have reviewed and discussed with patient certain preventive protocols, quality metrics, and best practice recommendations. A written personalized care plan for preventive services as well as general preventive health recommendations were provided to patient.   Ellouise VEAR Haws, LPN   [id number]/7974   Return in 1 year (on 09/29/2025) for AWV at 11:20 am .  After Visit Summary: (MyChart) Due to this being a telephonic visit, the after visit summary with patients personalized plan was offered to patient via MyChart   Nurse Notes: none

## 2024-09-17 NOTE — Patient Instructions (Signed)
 [name],  Thank you for taking the time for your Medicare Wellness Visit. I appreciate your continued commitment to your health goals. Please review the care plan we discussed, and feel free to reach out if I can assist you further.  Please note that Annual Wellness Visits do not include a physical exam. Some assessments may be limited, especially if the visit was conducted virtually. If needed, we may recommend an in-person follow-up with your provider.  Ongoing Care Seeing your primary care provider every 3 to 6 months helps us  monitor your health and provide consistent, personalized care.   Referrals If a referral was made during today's visit and you haven't received any updates within two weeks, please contact the referred provider directly to check on the status.  Recommended Screenings:  Health Maintenance  Topic Date Due   COVID-19 Vaccine (5 - 2025-26 season) 06/30/2024   Medicare Annual Wellness Visit  09/11/2024   Hemoglobin A1C  02/23/2025   Complete foot exam   05/09/2025   Breast Cancer Screening  05/13/2025   Eye exam for diabetics  09/02/2025   DTaP/Tdap/Td vaccine (3 - Td or Tdap) 04/29/2032   Pneumococcal Vaccine for age over 40  Completed   Flu Shot  Completed   DEXA scan (bone density measurement)  Completed   Zoster (Shingles) Vaccine  Completed   Meningitis B Vaccine  Aged Out       09/17/2024   11:28 AM  Advanced Directives  Does Patient Have a Medical Advance Directive? Yes  Type of Advance Directive Healthcare Power of Attorney  Copy of Healthcare Power of Attorney in Chart? No - copy requested    Vision: Annual vision screenings are recommended for early detection of glaucoma, cataracts, and diabetic retinopathy. These exams can also reveal signs of chronic conditions such as diabetes and high blood pressure.  Dental: Annual dental screenings help detect early signs of oral cancer, gum disease, and other conditions linked to overall health,  including heart disease and diabetes.  Please see the attached documents for additional preventive care recommendations.

## 2024-09-22 ENCOUNTER — Other Ambulatory Visit (HOSPITAL_COMMUNITY): Payer: Self-pay

## 2024-10-07 ENCOUNTER — Telehealth: Payer: Self-pay

## 2024-10-07 NOTE — Telephone Encounter (Signed)
 Left a message requesting pt return call to the office.

## 2024-10-13 ENCOUNTER — Other Ambulatory Visit: Payer: Self-pay | Admitting: Family Medicine

## 2024-10-13 ENCOUNTER — Other Ambulatory Visit: Payer: Self-pay | Admitting: "Endocrinology

## 2024-10-13 NOTE — Telephone Encounter (Unsigned)
 Copied from CRM #[id number]. Topic: Clinical - Medication Refill >> Oct 13, 2024 10:11 AM Viola F wrote: Medication: 90 DAY SUPPLY losartan  (COZAAR ) 100 MG tablet [[id number]]  Has the patient contacted their pharmacy? Yes (Agent: If no, request that the patient contact the pharmacy for the refill. If patient does not wish to contact the pharmacy document the reason why and proceed with request.) (Agent: If yes, when and what did the pharmacy advise?)  This is the patient's preferred pharmacy:   [facility] [address] [place] [address] Phone: [phone] Fax: [phone]   Is this the correct pharmacy for this prescription? Yes If no, delete pharmacy and type the correct one.   Has the prescription been filled recently? Yes  Is the patient out of the medication? No  Has the patient been seen for an appointment in the last year OR does the patient have an upcoming appointment? Yes  Can we respond through MyChart? No  Agent: Please be advised that Rx refills may take up to 3 business days. We ask that you follow-up with your pharmacy.

## 2024-10-14 ENCOUNTER — Other Ambulatory Visit: Payer: Self-pay

## 2024-10-14 ENCOUNTER — Telehealth: Payer: Self-pay | Admitting: "Endocrinology

## 2024-10-14 ENCOUNTER — Other Ambulatory Visit (HOSPITAL_COMMUNITY): Payer: Self-pay

## 2024-10-14 MED ORDER — CINACALCET HCL 30 MG PO TABS
30.0000 mg | ORAL_TABLET | Freq: Two times a day (BID) | ORAL | 1 refills | Status: DC
  Filled 2024-10-14 – 2024-10-15 (×3): qty 180, 90d supply, fill #0

## 2024-10-14 MED ORDER — LOSARTAN POTASSIUM 100 MG PO TABS
100.0000 mg | ORAL_TABLET | Freq: Every day | ORAL | 1 refills | Status: AC
  Filled 2024-10-14: qty 90, 90d supply, fill #0

## 2024-10-14 NOTE — Telephone Encounter (Signed)
 Patient notified

## 2024-10-14 NOTE — Telephone Encounter (Signed)
 Pt states she did not take her fosamax  (states she's on generic) last Wednesday due to groin pain as long as pain in her feet (that did subside) She states that is a side effect. She usually takes it on Wednesday. Do you want her to continue to take it?

## 2024-10-15 ENCOUNTER — Other Ambulatory Visit: Payer: Self-pay

## 2024-10-25 ENCOUNTER — Other Ambulatory Visit: Payer: Self-pay | Admitting: Family Medicine

## 2024-10-27 MED ORDER — ONETOUCH VERIO VI STRP
ORAL_STRIP | 3 refills | Status: AC
  Filled 2024-10-27: qty 100, 25d supply, fill #0
  Filled 2024-11-18: qty 100, 25d supply, fill #1

## 2024-10-28 ENCOUNTER — Other Ambulatory Visit (HOSPITAL_COMMUNITY): Payer: Self-pay

## 2024-11-18 ENCOUNTER — Other Ambulatory Visit: Payer: Self-pay

## 2024-11-18 ENCOUNTER — Other Ambulatory Visit (HOSPITAL_COMMUNITY): Payer: Self-pay

## 2024-11-19 ENCOUNTER — Other Ambulatory Visit: Payer: Self-pay

## 2024-11-19 DIAGNOSIS — E119 Type 2 diabetes mellitus without complications: Secondary | ICD-10-CM

## 2024-11-19 MED ORDER — ACCU-CHEK GUIDE W/DEVICE KIT
1.0000 [IU] | PACK | Freq: Three times a day (TID) | 0 refills | Status: AC
  Filled 2024-11-19: qty 1, 30d supply, fill #0

## 2024-11-19 MED ORDER — ACCU-CHEK GUIDE TEST VI STRP
ORAL_STRIP | Freq: Three times a day (TID) | 12 refills | Status: AC
  Filled 2024-11-19: qty 100, 33d supply, fill #0

## 2024-11-19 NOTE — Telephone Encounter (Signed)
 Copied from CRM #[id number]. Topic: Clinical - Prescription Issue >> Nov 19, 2024  1:43 PM Macario HERO wrote: Reason for CRM: [facility]; called said due to patient insurance change she is no longer able to get one touch and will need a new prescription for Accu-Chek guide. [phone]

## 2024-11-23 ENCOUNTER — Other Ambulatory Visit (HOSPITAL_COMMUNITY): Payer: Self-pay

## 2024-11-25 ENCOUNTER — Other Ambulatory Visit: Payer: Self-pay

## 2024-11-25 ENCOUNTER — Ambulatory Visit: Admitting: Family Medicine

## 2024-11-27 LAB — PTH, INTACT AND CALCIUM
Calcium: 9.5 mg/dL (ref 8.7–10.3)
PTH: 31 pg/mL (ref 15–65)

## 2024-12-03 ENCOUNTER — Ambulatory Visit: Admitting: "Endocrinology

## 2024-12-03 ENCOUNTER — Encounter: Payer: Self-pay | Admitting: "Endocrinology

## 2024-12-03 VITALS — BP 104/62 | HR 76 | Ht 60.0 in | Wt 137.0 lb

## 2024-12-03 DIAGNOSIS — M810 Age-related osteoporosis without current pathological fracture: Secondary | ICD-10-CM

## 2024-12-03 DIAGNOSIS — E212 Other hyperparathyroidism: Secondary | ICD-10-CM

## 2024-12-03 MED ORDER — CINACALCET HCL 30 MG PO TABS: 30.0000 mg | ORAL_TABLET | Freq: Every day | ORAL | 1 refills | Status: AC

## 2024-12-03 NOTE — Progress Notes (Signed)
 "                                                                 12/03/2024, 1:30 PM       Endocrinology follow-up note   [name] is a 89 y.o.-year-old female, here for discuss her recent bone density scan results.  She is on regular follow-up in this clinic for hypercalcemia related to hyperparathyroidism.    PMD: her  Kennyth Worth HERO, MD    Past Medical History:  Diagnosis Date   Arthritis    Cancer [facility])    Diabetes mellitus without complication (HCC)    GERD (gastroesophageal reflux disease)    Hyperlipidemia    Hypertension    Kidney stones    Thyroid  disease     Past Surgical History:  Procedure Laterality Date   BACK SURGERY     BREAST BIOPSY  06/05/2023   MM RT RADIOACTIVE SEED LOC MAMMO GUIDE 06/05/2023 [facility] MAMMOGRAP[facility]   BREAST LUMPECTOMY WITH RADIOACTIVE SEED LOCALIZATION Right 06/06/2023   Procedure: RIGHT BREAST LUMPECTOMY WITH RADIOACTIVE SEED LOCALIZATION;  Surgeon: Belinda Cough, MD;  Location: [facility];  Service: General;  Laterality: Right;  LMA   BREAST SURGERY     MELANOMA EXCISION     TONSILLECTOMY      Social History   Tobacco Use   Smoking status: Former    Current packs/day: 0.00    Average packs/day: 1 pack/day for 15.0 years (15.0 ttl pk-yrs)    Types: Cigarettes    Start date: 09/14/1955    Quit date: 07/14/1969    Years since quitting: 55.4   Smokeless tobacco: Never   Tobacco comments:    quit 40 years ago  Vaping Use   Vaping status: Never Used  Substance Use Topics   Alcohol  use: Never    Alcohol /week: 0.0 standard drinks of alcohol    Drug use: Never    Family History  Problem Relation Age of Onset   Cancer Mother        unk type; dx > 70; XRT in neck area   Hypertension Father    Other Other        brain tumor    Outpatient Encounter Medications as of 12/03/2024  Medication Sig   alendronate  (FOSAMAX ) 70 MG tablet Take 1 tablet (70 mg total) by mouth every 7 (seven) days. Take with a full glass of  water on an empty stomach. (Patient not taking: Reported on 12/03/2024)   Blood Glucose Monitoring Suppl (ACCU-CHEK GUIDE) w/Device KIT Use as directed 3 times daily.   Cholecalciferol (VITAMIN D -3) 1000 UNITS CAPS Take 2,000 Units by mouth 2 (two) times daily.   cinacalcet  (SENSIPAR ) 30 MG tablet Take 1 tablet (30 mg total) by mouth daily with breakfast.   glucose blood (ACCU-CHEK GUIDE TEST) test strip Test 3 (three) times daily as directed.   losartan  (COZAAR ) 100 MG tablet Take 1 tablet (100 mg total) by mouth daily.   Multiple Vitamins-Minerals (CENTRUM SILVER 50+WOMEN PO) Take 1 tablet by mouth daily.   Multiple Vitamins-Minerals (ICAPS AREDS 2 PO) Take by mouth.   omeprazole  (PRILOSEC) 20 MG capsule TAKE 1 CAPSULE(20 MG) BY MOUTH DAILY   rosuvastatin  (CRESTOR ) 10 MG tablet Take 1 tablet (10 mg total) by  mouth daily.   sodium chloride (MURO 128) 2 % ophthalmic solution Place 1 drop into both eyes 2 (two) times daily.   tirzepatide  (MOUNJARO ) 5 MG/0.5ML Pen Inject 5 mg into the skin once a week.   vitamin C (ASCORBIC ACID) 250 MG tablet Take 250 mg by mouth daily.   [DISCONTINUED] cinacalcet  (SENSIPAR ) 30 MG tablet Take 1 tablet (30 mg total) by mouth 2 (two) times daily with a meal.   No facility-administered encounter medications on file as of 12/03/2024.    No Known Allergies   HPI  [name] was diagnosed with hypercalcemia in May 2020.   Her previous workup indicated etiology of hypercalcemia to be primary hyperparathyroidism. Since she is not a surgical candidate, she was started on Sensipar ,  currently on 30 mg p.o. twice a day.  She continues to tolerate this medication.  She presents with controlled calcium  at 9.5 and PTH of 31.    Based on her bone density in August 2025, she was diagnosed with osteoporosis for which she was given alendronate  70 mg p.o. weekly.  Due to her Ortho clinic for mild cramps on unilateral right lower extremity, she was advised to hold for a few  weeks to observe.  She did not restart Fosamax .  She does not have chronic complaints today.   She denies any prior history of fractures.   She is known to have recurrent nephrolithiasis. She has diabetes controlled with recent A1c of 6.4% following up with her PMD.  She remains on Mounjaro  5 mg subcutaneously weekly.  She has achieved 20 pounds of weight loss over the last 2 years.  She also has hyperlipidemia uncontrolled on Crestor  10 mg.  No history of CKD.   she is not on HCTZ or other thiazide therapy.  She is on vitamin D  supplement-4000 units daily.  She is not on calcium  supplements. she eats dairy and green, leafy, vegetables on average amounts.  she does not have a family history of hypercalcemia, pituitary tumors, thyroid  cancer, or osteoporosis.  -On history, she lost 2 inches of height over the years.   ROS: Limited as above.  PE: BP 104/62   Pulse 76   Ht 5' (1.524 m)   Wt 137 lb (62.1 kg)   BMI 26.76 kg/m , Body mass index is 26.76 kg/m. Wt Readings from Last 3 Encounters:  12/03/24 137 lb (62.1 kg)  09/17/24 135 lb (61.2 kg)  09/01/24 135 lb (61.2 kg)      CMP     Component Value Date/Time   NA 142 09/01/2024 1038   NA 143 05/28/2023 0947   NA 145 02/15/2017 1333   NA 144 08/12/2015 1419   K 4.1 09/01/2024 1038   K 4.4 02/15/2017 1333   K 4.2 08/12/2015 1419   CL 104 09/01/2024 1038   CL 104 02/15/2017 1333   CO2 27 09/01/2024 1038   CO2 28 02/15/2017 1333   CO2 29 08/12/2015 1419   GLUCOSE 135 (H) 09/01/2024 1038   GLUCOSE 142 (H) 02/15/2017 1333   BUN 10 09/01/2024 1038   BUN 6 (L) 05/28/2023 0947   BUN 13 02/15/2017 1333   BUN 11.8 08/12/2015 1419   CREATININE 0.84 09/01/2024 1038   CREATININE 0.82 03/24/2020 1122   CREATININE 0.8 08/12/2015 1419   CALCIUM  9.5 11/26/2024 0955   CALCIUM  10.2 02/15/2017 1333   CALCIUM  11.1 (H) 08/12/2015 1419   PROT 7.4 09/01/2024 1038   PROT 7.4 05/28/2023 0947   PROT  7.5 02/15/2017 1333   PROT 7.2  08/12/2015 1419   ALBUMIN 4.2 09/01/2024 1038   ALBUMIN 4.4 05/28/2023 0947   ALBUMIN 150 MG/L 07/15/2019 1130   ALBUMIN 3.9 08/12/2015 1419   AST 20 09/01/2024 1038   AST 26 08/12/2015 1419   ALT 17 09/01/2024 1038   ALT 39 02/15/2017 1333   ALT 32 08/12/2015 1419   ALKPHOS 76 09/01/2024 1038   ALKPHOS 76 02/15/2017 1333   ALKPHOS 74 08/12/2015 1419   BILITOT 0.5 09/01/2024 1038   BILITOT 0.52 08/12/2015 1419   GFRNONAA >60 09/01/2024 1038   GFRNONAA 67 03/24/2020 1122   GFRAA 77 03/24/2020 1122     Diabetic Labs (most recent): Lab Results  Component Value Date   HGBA1C 5.8 (A) 08/25/2024   HGBA1C 6.8 (A) 05/26/2024   HGBA1C 6.2 (A) 02/25/2024   MICROALBUR 6.6 (H) 02/25/2024   MICROALBUR 7.2 11/16/2023   MICROALBUR 292 12/10/2017     Lipid Panel ( most recent) Lipid Panel     Component Value Date/Time   CHOL 114 07/25/2023 0858   TRIG 149.0 07/25/2023 0858   HDL 33.20 (L) 07/25/2023 0858   CHOLHDL 3 07/25/2023 0858   VLDL 29.8 07/25/2023 0858   LDLCALC 51 07/25/2023 0858   LDLCALC 86 11/06/2019 0836   LDLDIRECT 91.0 03/18/2021 1039      Lab Results  Component Value Date   TSH 6.29 (H) 04/18/2021   TSH 5.27 (H) 03/18/2021   TSH 3.96 11/21/2019   TSH 4.00 03/17/2019   TSH 4.36 03/12/2018   TSH 2.93 03/12/2017   FREET4 0.58 (L) 04/18/2021      Assessment: 1.  Osteoporosis-mainly affecting forearm  2. Hypercalcemia / Hyperparathyroidism  Plan: See notes from prior visits.  She is not a surgical candidate for primary hyperparathyroidism.   Due to controlled calcium  and PTH, she is advised to lower her Sensipar  to 30 mg p.o. daily. -She is willing to retry Fosamax  70 mg p.o. weekly.  She is encouraged and advised to discontinue if she develops the cramps again.  She will be considered for periodic bone density every 2 years to assess treatment response.  Her next bone density will be in August 2027.  - Patient also  has vitamin D  deficiency on  supplement-advised to continue vitamin D3 4000 units daily.  -PTH RP is normal, malignancy related hypercalcemia unlikely. -There seems to be a complication from hypercalcemia/hypocalcemia with 3 mm nonobstructive right sided nephrolithiasis, besides osteoporosis.  Her records from June 2022 showed high TSH of 6.29, free T4 of 0.58.  She is not on thyroid  hormone.  She will have repeat thyroid  function test along with her next labs.    She is encouraged to stay in close follow-up with her PCP.  I spent  20  minutes in the care of the patient today including review of labs from Thyroid  Function, CMP, and other relevant labs ; imaging/biopsy records (current and previous including abstractions from other facilities); face-to-face time discussing  her lab results and symptoms, medications doses, her options of short and long term treatment based on the latest standards of care / guidelines;   and documenting the encounter.  Foy PARAS Redford  participated in the discussions, expressed understanding, and voiced agreement with the above plans.  All questions were answered to her satisfaction. she is encouraged to contact clinic should she have any questions or concerns prior to her return visit. Dear Patient: Feel free to review your progress notes.  If  you are reviewing this progress note and have questions about the meaning of /or medical terms being used, please make a note and address it at your next follow-up appointment.  Medical notes are meant to be a communication tool between medical professionals and require medical terms to be used for efficiency and insurance approval.   - Return in about 6 months (around 06/02/2025) for F/U with Pre-visit Labs.   Ranny Earl, MD [facility] [address] [place], [place] [address] Phone: [phone]  Fax: [phone]    This note was partially dictated with voice recognition software. Similar  sounding words can be transcribed inadequately or may not  be corrected upon review.  12/03/2024, 1:30 PM "

## 2024-12-05 ENCOUNTER — Ambulatory Visit: Admitting: Family Medicine

## 2024-12-09 ENCOUNTER — Ambulatory Visit: Admitting: Family Medicine

## 2025-04-27 ENCOUNTER — Inpatient Hospital Stay: Admitting: Hematology & Oncology

## 2025-04-27 ENCOUNTER — Inpatient Hospital Stay

## 2025-06-02 ENCOUNTER — Ambulatory Visit: Admitting: "Endocrinology
